# Patient Record
Sex: Male | Born: 1950 | Race: White | Hispanic: No | Marital: Married | Smoking: Former smoker
Health system: Southern US, Community
[De-identification: ages and names within clinical notes are randomized; demographics above are authoritative.]

## PROBLEM LIST (undated history)

## (undated) DIAGNOSIS — M4800 Spinal stenosis, site unspecified: Secondary | ICD-10-CM

## (undated) DIAGNOSIS — K219 Gastro-esophageal reflux disease without esophagitis: Secondary | ICD-10-CM

## (undated) DIAGNOSIS — N3941 Urge incontinence: Secondary | ICD-10-CM

## (undated) DIAGNOSIS — F419 Anxiety disorder, unspecified: Secondary | ICD-10-CM

## (undated) DIAGNOSIS — L568 Other specified acute skin changes due to ultraviolet radiation: Secondary | ICD-10-CM

## (undated) DIAGNOSIS — F32A Depression, unspecified: Secondary | ICD-10-CM

## (undated) DIAGNOSIS — F1010 Alcohol abuse, uncomplicated: Secondary | ICD-10-CM

## (undated) DIAGNOSIS — Z87442 Personal history of urinary calculi: Secondary | ICD-10-CM

## (undated) DIAGNOSIS — G629 Polyneuropathy, unspecified: Secondary | ICD-10-CM

## (undated) DIAGNOSIS — C84A0 Cutaneous T-cell lymphoma, unspecified, unspecified site: Secondary | ICD-10-CM

## (undated) DIAGNOSIS — R9431 Abnormal electrocardiogram [ECG] [EKG]: Secondary | ICD-10-CM

## (undated) DIAGNOSIS — I10 Essential (primary) hypertension: Secondary | ICD-10-CM

## (undated) DIAGNOSIS — N529 Male erectile dysfunction, unspecified: Secondary | ICD-10-CM

## (undated) DIAGNOSIS — F329 Major depressive disorder, single episode, unspecified: Secondary | ICD-10-CM

## (undated) DIAGNOSIS — F1220 Cannabis dependence, uncomplicated: Secondary | ICD-10-CM

## (undated) DIAGNOSIS — G4730 Sleep apnea, unspecified: Secondary | ICD-10-CM

## (undated) HISTORY — PX: COLONOSCOPY: SHX174

## (undated) HISTORY — PX: ESOPHAGOGASTRODUODENOSCOPY: SHX1529

## (undated) HISTORY — DX: Alcohol abuse, uncomplicated: F10.10

## (undated) HISTORY — DX: Essential (primary) hypertension: I10

## (undated) HISTORY — DX: Anxiety disorder, unspecified: F41.9

## (undated) HISTORY — DX: Abnormal electrocardiogram (ECG) (EKG): R94.31

## (undated) HISTORY — PX: ROTATOR CUFF REPAIR: SHX139

## (undated) HISTORY — DX: Cannabis dependence, uncomplicated: F12.20

## (undated) HISTORY — DX: Spinal stenosis, site unspecified: M48.00

## (undated) HISTORY — DX: Other specified acute skin changes due to ultraviolet radiation: L56.8

## (undated) HISTORY — DX: Urge incontinence: N39.41

## (undated) HISTORY — DX: Male erectile dysfunction, unspecified: N52.9

## (undated) HISTORY — DX: Depression, unspecified: F32.A

## (undated) HISTORY — DX: Major depressive disorder, single episode, unspecified: F32.9

---

## 1989-02-28 HISTORY — PX: CARPAL TUNNEL RELEASE: SHX101

## 2014-02-10 HISTORY — PX: ROTATOR CUFF REPAIR: SHX139

## 2014-07-30 HISTORY — PX: CYST REMOVAL TRUNK: SHX6283

## 2016-02-29 HISTORY — PX: ULNAR NERVE TRANSPOSITION: SHX2595

## 2016-04-29 DIAGNOSIS — R202 Paresthesia of skin: Secondary | ICD-10-CM | POA: Diagnosis not present

## 2016-04-29 DIAGNOSIS — G5623 Lesion of ulnar nerve, bilateral upper limbs: Secondary | ICD-10-CM | POA: Diagnosis not present

## 2016-04-29 DIAGNOSIS — R531 Weakness: Secondary | ICD-10-CM | POA: Diagnosis not present

## 2016-05-06 DIAGNOSIS — J309 Allergic rhinitis, unspecified: Secondary | ICD-10-CM | POA: Diagnosis not present

## 2016-05-09 DIAGNOSIS — Z8546 Personal history of malignant neoplasm of prostate: Secondary | ICD-10-CM | POA: Diagnosis not present

## 2016-05-09 DIAGNOSIS — G5622 Lesion of ulnar nerve, left upper limb: Secondary | ICD-10-CM | POA: Diagnosis not present

## 2016-05-09 DIAGNOSIS — M25522 Pain in left elbow: Secondary | ICD-10-CM | POA: Diagnosis not present

## 2016-05-09 DIAGNOSIS — F3289 Other specified depressive episodes: Secondary | ICD-10-CM | POA: Diagnosis not present

## 2016-05-09 DIAGNOSIS — Z886 Allergy status to analgesic agent status: Secondary | ICD-10-CM | POA: Diagnosis not present

## 2016-05-09 DIAGNOSIS — R202 Paresthesia of skin: Secondary | ICD-10-CM | POA: Diagnosis not present

## 2016-05-09 DIAGNOSIS — M79602 Pain in left arm: Secondary | ICD-10-CM | POA: Diagnosis not present

## 2016-05-16 DIAGNOSIS — H2513 Age-related nuclear cataract, bilateral: Secondary | ICD-10-CM | POA: Diagnosis not present

## 2016-05-16 DIAGNOSIS — H40013 Open angle with borderline findings, low risk, bilateral: Secondary | ICD-10-CM | POA: Diagnosis not present

## 2016-05-16 DIAGNOSIS — H5010 Unspecified exotropia: Secondary | ICD-10-CM | POA: Diagnosis not present

## 2016-05-17 DIAGNOSIS — H2513 Age-related nuclear cataract, bilateral: Secondary | ICD-10-CM | POA: Diagnosis not present

## 2016-05-17 DIAGNOSIS — H40013 Open angle with borderline findings, low risk, bilateral: Secondary | ICD-10-CM | POA: Diagnosis not present

## 2016-05-19 DIAGNOSIS — G5622 Lesion of ulnar nerve, left upper limb: Secondary | ICD-10-CM | POA: Diagnosis not present

## 2016-05-19 DIAGNOSIS — M79602 Pain in left arm: Secondary | ICD-10-CM | POA: Diagnosis not present

## 2016-05-19 DIAGNOSIS — R202 Paresthesia of skin: Secondary | ICD-10-CM | POA: Diagnosis not present

## 2016-05-23 DIAGNOSIS — G5621 Lesion of ulnar nerve, right upper limb: Secondary | ICD-10-CM | POA: Diagnosis not present

## 2016-05-23 DIAGNOSIS — R200 Anesthesia of skin: Secondary | ICD-10-CM | POA: Diagnosis not present

## 2016-05-23 DIAGNOSIS — G5622 Lesion of ulnar nerve, left upper limb: Secondary | ICD-10-CM | POA: Diagnosis not present

## 2016-05-24 DIAGNOSIS — G5622 Lesion of ulnar nerve, left upper limb: Secondary | ICD-10-CM | POA: Diagnosis not present

## 2016-05-25 DIAGNOSIS — G5620 Lesion of ulnar nerve, unspecified upper limb: Secondary | ICD-10-CM | POA: Diagnosis not present

## 2016-05-25 DIAGNOSIS — Z01818 Encounter for other preprocedural examination: Secondary | ICD-10-CM | POA: Diagnosis not present

## 2016-05-25 DIAGNOSIS — R9431 Abnormal electrocardiogram [ECG] [EKG]: Secondary | ICD-10-CM | POA: Diagnosis not present

## 2016-05-27 DIAGNOSIS — F339 Major depressive disorder, recurrent, unspecified: Secondary | ICD-10-CM | POA: Diagnosis not present

## 2016-05-27 DIAGNOSIS — Z01810 Encounter for preprocedural cardiovascular examination: Secondary | ICD-10-CM | POA: Diagnosis not present

## 2016-05-27 DIAGNOSIS — G5620 Lesion of ulnar nerve, unspecified upper limb: Secondary | ICD-10-CM | POA: Diagnosis not present

## 2016-05-27 DIAGNOSIS — I10 Essential (primary) hypertension: Secondary | ICD-10-CM | POA: Diagnosis not present

## 2016-05-27 DIAGNOSIS — R9431 Abnormal electrocardiogram [ECG] [EKG]: Secondary | ICD-10-CM | POA: Diagnosis not present

## 2016-05-31 DIAGNOSIS — G5621 Lesion of ulnar nerve, right upper limb: Secondary | ICD-10-CM | POA: Diagnosis not present

## 2016-05-31 DIAGNOSIS — G5622 Lesion of ulnar nerve, left upper limb: Secondary | ICD-10-CM | POA: Diagnosis not present

## 2016-06-01 DIAGNOSIS — M25622 Stiffness of left elbow, not elsewhere classified: Secondary | ICD-10-CM | POA: Diagnosis not present

## 2016-06-01 DIAGNOSIS — G5622 Lesion of ulnar nerve, left upper limb: Secondary | ICD-10-CM | POA: Diagnosis not present

## 2016-06-01 DIAGNOSIS — R531 Weakness: Secondary | ICD-10-CM | POA: Diagnosis not present

## 2016-06-01 DIAGNOSIS — M25522 Pain in left elbow: Secondary | ICD-10-CM | POA: Diagnosis not present

## 2016-06-03 DIAGNOSIS — M25522 Pain in left elbow: Secondary | ICD-10-CM | POA: Diagnosis not present

## 2016-06-03 DIAGNOSIS — R531 Weakness: Secondary | ICD-10-CM | POA: Diagnosis not present

## 2016-06-03 DIAGNOSIS — G5622 Lesion of ulnar nerve, left upper limb: Secondary | ICD-10-CM | POA: Diagnosis not present

## 2016-06-03 DIAGNOSIS — M25622 Stiffness of left elbow, not elsewhere classified: Secondary | ICD-10-CM | POA: Diagnosis not present

## 2016-06-06 DIAGNOSIS — R531 Weakness: Secondary | ICD-10-CM | POA: Diagnosis not present

## 2016-06-06 DIAGNOSIS — M25522 Pain in left elbow: Secondary | ICD-10-CM | POA: Diagnosis not present

## 2016-06-06 DIAGNOSIS — G5622 Lesion of ulnar nerve, left upper limb: Secondary | ICD-10-CM | POA: Diagnosis not present

## 2016-06-06 DIAGNOSIS — M25622 Stiffness of left elbow, not elsewhere classified: Secondary | ICD-10-CM | POA: Diagnosis not present

## 2016-06-07 DIAGNOSIS — Z4789 Encounter for other orthopedic aftercare: Secondary | ICD-10-CM | POA: Diagnosis not present

## 2016-06-07 DIAGNOSIS — G5622 Lesion of ulnar nerve, left upper limb: Secondary | ICD-10-CM | POA: Diagnosis not present

## 2016-06-08 DIAGNOSIS — M25522 Pain in left elbow: Secondary | ICD-10-CM | POA: Diagnosis not present

## 2016-06-08 DIAGNOSIS — G5622 Lesion of ulnar nerve, left upper limb: Secondary | ICD-10-CM | POA: Diagnosis not present

## 2016-06-08 DIAGNOSIS — M25622 Stiffness of left elbow, not elsewhere classified: Secondary | ICD-10-CM | POA: Diagnosis not present

## 2016-06-08 DIAGNOSIS — R531 Weakness: Secondary | ICD-10-CM | POA: Diagnosis not present

## 2016-06-10 DIAGNOSIS — M25522 Pain in left elbow: Secondary | ICD-10-CM | POA: Diagnosis not present

## 2016-06-10 DIAGNOSIS — R531 Weakness: Secondary | ICD-10-CM | POA: Diagnosis not present

## 2016-06-10 DIAGNOSIS — M25622 Stiffness of left elbow, not elsewhere classified: Secondary | ICD-10-CM | POA: Diagnosis not present

## 2016-06-10 DIAGNOSIS — G5622 Lesion of ulnar nerve, left upper limb: Secondary | ICD-10-CM | POA: Diagnosis not present

## 2016-06-14 DIAGNOSIS — M25522 Pain in left elbow: Secondary | ICD-10-CM | POA: Diagnosis not present

## 2016-06-14 DIAGNOSIS — M25622 Stiffness of left elbow, not elsewhere classified: Secondary | ICD-10-CM | POA: Diagnosis not present

## 2016-06-14 DIAGNOSIS — R531 Weakness: Secondary | ICD-10-CM | POA: Diagnosis not present

## 2016-06-14 DIAGNOSIS — G5622 Lesion of ulnar nerve, left upper limb: Secondary | ICD-10-CM | POA: Diagnosis not present

## 2016-06-16 DIAGNOSIS — M25522 Pain in left elbow: Secondary | ICD-10-CM | POA: Diagnosis not present

## 2016-06-16 DIAGNOSIS — M25622 Stiffness of left elbow, not elsewhere classified: Secondary | ICD-10-CM | POA: Diagnosis not present

## 2016-06-16 DIAGNOSIS — G5622 Lesion of ulnar nerve, left upper limb: Secondary | ICD-10-CM | POA: Diagnosis not present

## 2016-06-16 DIAGNOSIS — R531 Weakness: Secondary | ICD-10-CM | POA: Diagnosis not present

## 2016-06-21 DIAGNOSIS — G5622 Lesion of ulnar nerve, left upper limb: Secondary | ICD-10-CM | POA: Diagnosis not present

## 2016-06-21 DIAGNOSIS — M25622 Stiffness of left elbow, not elsewhere classified: Secondary | ICD-10-CM | POA: Diagnosis not present

## 2016-06-21 DIAGNOSIS — M25522 Pain in left elbow: Secondary | ICD-10-CM | POA: Diagnosis not present

## 2016-06-21 DIAGNOSIS — R531 Weakness: Secondary | ICD-10-CM | POA: Diagnosis not present

## 2016-06-24 DIAGNOSIS — R531 Weakness: Secondary | ICD-10-CM | POA: Diagnosis not present

## 2016-06-24 DIAGNOSIS — M25622 Stiffness of left elbow, not elsewhere classified: Secondary | ICD-10-CM | POA: Diagnosis not present

## 2016-06-24 DIAGNOSIS — G5622 Lesion of ulnar nerve, left upper limb: Secondary | ICD-10-CM | POA: Diagnosis not present

## 2016-06-24 DIAGNOSIS — M25522 Pain in left elbow: Secondary | ICD-10-CM | POA: Diagnosis not present

## 2016-06-27 DIAGNOSIS — R9431 Abnormal electrocardiogram [ECG] [EKG]: Secondary | ICD-10-CM | POA: Diagnosis not present

## 2016-06-27 HISTORY — PX: OTHER SURGICAL HISTORY: SHX169

## 2016-06-29 DIAGNOSIS — G5622 Lesion of ulnar nerve, left upper limb: Secondary | ICD-10-CM | POA: Diagnosis not present

## 2016-06-29 DIAGNOSIS — M25522 Pain in left elbow: Secondary | ICD-10-CM | POA: Diagnosis not present

## 2016-06-29 DIAGNOSIS — R531 Weakness: Secondary | ICD-10-CM | POA: Diagnosis not present

## 2016-06-29 DIAGNOSIS — M25622 Stiffness of left elbow, not elsewhere classified: Secondary | ICD-10-CM | POA: Diagnosis not present

## 2016-07-04 DIAGNOSIS — I10 Essential (primary) hypertension: Secondary | ICD-10-CM | POA: Diagnosis not present

## 2016-07-04 DIAGNOSIS — R9431 Abnormal electrocardiogram [ECG] [EKG]: Secondary | ICD-10-CM | POA: Diagnosis not present

## 2016-07-04 DIAGNOSIS — G5620 Lesion of ulnar nerve, unspecified upper limb: Secondary | ICD-10-CM | POA: Diagnosis not present

## 2016-07-04 DIAGNOSIS — Z87891 Personal history of nicotine dependence: Secondary | ICD-10-CM | POA: Diagnosis not present

## 2016-07-04 DIAGNOSIS — F339 Major depressive disorder, recurrent, unspecified: Secondary | ICD-10-CM | POA: Diagnosis not present

## 2016-07-06 DIAGNOSIS — M25622 Stiffness of left elbow, not elsewhere classified: Secondary | ICD-10-CM | POA: Diagnosis not present

## 2016-07-06 DIAGNOSIS — G5622 Lesion of ulnar nerve, left upper limb: Secondary | ICD-10-CM | POA: Diagnosis not present

## 2016-07-06 DIAGNOSIS — R531 Weakness: Secondary | ICD-10-CM | POA: Diagnosis not present

## 2016-07-06 DIAGNOSIS — M25522 Pain in left elbow: Secondary | ICD-10-CM | POA: Diagnosis not present

## 2016-07-18 DIAGNOSIS — F411 Generalized anxiety disorder: Secondary | ICD-10-CM | POA: Diagnosis not present

## 2016-07-18 DIAGNOSIS — F332 Major depressive disorder, recurrent severe without psychotic features: Secondary | ICD-10-CM | POA: Diagnosis not present

## 2016-07-18 DIAGNOSIS — Z4789 Encounter for other orthopedic aftercare: Secondary | ICD-10-CM | POA: Diagnosis not present

## 2016-07-18 DIAGNOSIS — G5622 Lesion of ulnar nerve, left upper limb: Secondary | ICD-10-CM | POA: Diagnosis not present

## 2016-10-20 DIAGNOSIS — M25522 Pain in left elbow: Secondary | ICD-10-CM | POA: Diagnosis not present

## 2016-10-20 DIAGNOSIS — M7022 Olecranon bursitis, left elbow: Secondary | ICD-10-CM | POA: Diagnosis not present

## 2016-10-20 DIAGNOSIS — G5622 Lesion of ulnar nerve, left upper limb: Secondary | ICD-10-CM | POA: Diagnosis not present

## 2016-11-11 DIAGNOSIS — Z125 Encounter for screening for malignant neoplasm of prostate: Secondary | ICD-10-CM | POA: Diagnosis not present

## 2016-11-11 DIAGNOSIS — F339 Major depressive disorder, recurrent, unspecified: Secondary | ICD-10-CM | POA: Diagnosis not present

## 2016-11-11 DIAGNOSIS — I10 Essential (primary) hypertension: Secondary | ICD-10-CM | POA: Diagnosis not present

## 2016-11-18 DIAGNOSIS — Z6833 Body mass index (BMI) 33.0-33.9, adult: Secondary | ICD-10-CM | POA: Diagnosis not present

## 2016-11-18 DIAGNOSIS — Z23 Encounter for immunization: Secondary | ICD-10-CM | POA: Diagnosis not present

## 2016-11-18 DIAGNOSIS — Z136 Encounter for screening for cardiovascular disorders: Secondary | ICD-10-CM | POA: Diagnosis not present

## 2016-11-18 DIAGNOSIS — I10 Essential (primary) hypertension: Secondary | ICD-10-CM | POA: Diagnosis not present

## 2016-11-18 DIAGNOSIS — Z1389 Encounter for screening for other disorder: Secondary | ICD-10-CM | POA: Diagnosis not present

## 2016-11-18 DIAGNOSIS — Z87891 Personal history of nicotine dependence: Secondary | ICD-10-CM | POA: Diagnosis not present

## 2016-11-18 DIAGNOSIS — Z0000 Encounter for general adult medical examination without abnormal findings: Secondary | ICD-10-CM | POA: Diagnosis not present

## 2016-11-18 DIAGNOSIS — R972 Elevated prostate specific antigen [PSA]: Secondary | ICD-10-CM | POA: Diagnosis not present

## 2016-11-18 DIAGNOSIS — Z1159 Encounter for screening for other viral diseases: Secondary | ICD-10-CM | POA: Diagnosis not present

## 2016-11-21 DIAGNOSIS — F411 Generalized anxiety disorder: Secondary | ICD-10-CM | POA: Diagnosis not present

## 2016-11-21 DIAGNOSIS — F332 Major depressive disorder, recurrent severe without psychotic features: Secondary | ICD-10-CM | POA: Diagnosis not present

## 2016-11-21 DIAGNOSIS — M25522 Pain in left elbow: Secondary | ICD-10-CM | POA: Diagnosis not present

## 2016-11-21 DIAGNOSIS — G5622 Lesion of ulnar nerve, left upper limb: Secondary | ICD-10-CM | POA: Diagnosis not present

## 2016-11-21 DIAGNOSIS — M7022 Olecranon bursitis, left elbow: Secondary | ICD-10-CM | POA: Diagnosis not present

## 2016-12-21 DIAGNOSIS — R350 Frequency of micturition: Secondary | ICD-10-CM | POA: Diagnosis not present

## 2016-12-21 DIAGNOSIS — N200 Calculus of kidney: Secondary | ICD-10-CM | POA: Diagnosis not present

## 2016-12-21 DIAGNOSIS — G5623 Lesion of ulnar nerve, bilateral upper limbs: Secondary | ICD-10-CM | POA: Diagnosis not present

## 2016-12-21 DIAGNOSIS — M542 Cervicalgia: Secondary | ICD-10-CM | POA: Diagnosis not present

## 2016-12-21 DIAGNOSIS — R319 Hematuria, unspecified: Secondary | ICD-10-CM | POA: Diagnosis not present

## 2016-12-21 DIAGNOSIS — M50122 Cervical disc disorder at C5-C6 level with radiculopathy: Secondary | ICD-10-CM | POA: Diagnosis not present

## 2016-12-21 DIAGNOSIS — M50123 Cervical disc disorder at C6-C7 level with radiculopathy: Secondary | ICD-10-CM | POA: Diagnosis not present

## 2016-12-21 DIAGNOSIS — M488X2 Other specified spondylopathies, cervical region: Secondary | ICD-10-CM | POA: Diagnosis not present

## 2017-02-01 DIAGNOSIS — M5030 Other cervical disc degeneration, unspecified cervical region: Secondary | ICD-10-CM | POA: Diagnosis not present

## 2017-02-01 DIAGNOSIS — M542 Cervicalgia: Secondary | ICD-10-CM | POA: Diagnosis not present

## 2017-02-01 DIAGNOSIS — M5412 Radiculopathy, cervical region: Secondary | ICD-10-CM | POA: Diagnosis not present

## 2017-02-03 DIAGNOSIS — M542 Cervicalgia: Secondary | ICD-10-CM | POA: Diagnosis not present

## 2017-02-03 DIAGNOSIS — M5412 Radiculopathy, cervical region: Secondary | ICD-10-CM | POA: Diagnosis not present

## 2017-02-03 DIAGNOSIS — M5030 Other cervical disc degeneration, unspecified cervical region: Secondary | ICD-10-CM | POA: Diagnosis not present

## 2017-02-06 DIAGNOSIS — M5412 Radiculopathy, cervical region: Secondary | ICD-10-CM | POA: Diagnosis not present

## 2017-02-06 DIAGNOSIS — M5030 Other cervical disc degeneration, unspecified cervical region: Secondary | ICD-10-CM | POA: Diagnosis not present

## 2017-02-08 DIAGNOSIS — M542 Cervicalgia: Secondary | ICD-10-CM | POA: Diagnosis not present

## 2017-02-08 DIAGNOSIS — M5030 Other cervical disc degeneration, unspecified cervical region: Secondary | ICD-10-CM | POA: Diagnosis not present

## 2017-02-08 DIAGNOSIS — M5412 Radiculopathy, cervical region: Secondary | ICD-10-CM | POA: Diagnosis not present

## 2017-02-10 DIAGNOSIS — M542 Cervicalgia: Secondary | ICD-10-CM | POA: Diagnosis not present

## 2017-02-10 DIAGNOSIS — M5030 Other cervical disc degeneration, unspecified cervical region: Secondary | ICD-10-CM | POA: Diagnosis not present

## 2017-02-10 DIAGNOSIS — M5412 Radiculopathy, cervical region: Secondary | ICD-10-CM | POA: Diagnosis not present

## 2017-02-13 DIAGNOSIS — M542 Cervicalgia: Secondary | ICD-10-CM | POA: Diagnosis not present

## 2017-02-13 DIAGNOSIS — M5030 Other cervical disc degeneration, unspecified cervical region: Secondary | ICD-10-CM | POA: Diagnosis not present

## 2017-02-13 DIAGNOSIS — M5412 Radiculopathy, cervical region: Secondary | ICD-10-CM | POA: Diagnosis not present

## 2017-02-15 DIAGNOSIS — M5412 Radiculopathy, cervical region: Secondary | ICD-10-CM | POA: Diagnosis not present

## 2017-02-15 DIAGNOSIS — M5030 Other cervical disc degeneration, unspecified cervical region: Secondary | ICD-10-CM | POA: Diagnosis not present

## 2017-02-15 DIAGNOSIS — M542 Cervicalgia: Secondary | ICD-10-CM | POA: Diagnosis not present

## 2017-02-17 DIAGNOSIS — M5412 Radiculopathy, cervical region: Secondary | ICD-10-CM | POA: Diagnosis not present

## 2017-02-17 DIAGNOSIS — M5030 Other cervical disc degeneration, unspecified cervical region: Secondary | ICD-10-CM | POA: Diagnosis not present

## 2017-02-17 DIAGNOSIS — M542 Cervicalgia: Secondary | ICD-10-CM | POA: Diagnosis not present

## 2017-02-24 DIAGNOSIS — M5412 Radiculopathy, cervical region: Secondary | ICD-10-CM | POA: Diagnosis not present

## 2017-02-24 DIAGNOSIS — M542 Cervicalgia: Secondary | ICD-10-CM | POA: Diagnosis not present

## 2017-02-24 DIAGNOSIS — M5030 Other cervical disc degeneration, unspecified cervical region: Secondary | ICD-10-CM | POA: Diagnosis not present

## 2017-02-27 DIAGNOSIS — M5412 Radiculopathy, cervical region: Secondary | ICD-10-CM | POA: Diagnosis not present

## 2017-02-27 DIAGNOSIS — M5030 Other cervical disc degeneration, unspecified cervical region: Secondary | ICD-10-CM | POA: Diagnosis not present

## 2017-02-27 DIAGNOSIS — M542 Cervicalgia: Secondary | ICD-10-CM | POA: Diagnosis not present

## 2017-03-06 DIAGNOSIS — M25512 Pain in left shoulder: Secondary | ICD-10-CM | POA: Diagnosis not present

## 2017-03-07 DIAGNOSIS — S46012A Strain of muscle(s) and tendon(s) of the rotator cuff of left shoulder, initial encounter: Secondary | ICD-10-CM | POA: Diagnosis not present

## 2017-03-08 DIAGNOSIS — M25512 Pain in left shoulder: Secondary | ICD-10-CM | POA: Diagnosis not present

## 2017-03-16 DIAGNOSIS — M75121 Complete rotator cuff tear or rupture of right shoulder, not specified as traumatic: Secondary | ICD-10-CM | POA: Diagnosis not present

## 2017-03-17 DIAGNOSIS — M25512 Pain in left shoulder: Secondary | ICD-10-CM | POA: Diagnosis not present

## 2017-03-17 DIAGNOSIS — I10 Essential (primary) hypertension: Secondary | ICD-10-CM | POA: Diagnosis not present

## 2017-03-17 DIAGNOSIS — F339 Major depressive disorder, recurrent, unspecified: Secondary | ICD-10-CM | POA: Diagnosis not present

## 2017-03-17 DIAGNOSIS — Z01812 Encounter for preprocedural laboratory examination: Secondary | ICD-10-CM | POA: Diagnosis not present

## 2017-03-17 DIAGNOSIS — G5620 Lesion of ulnar nerve, unspecified upper limb: Secondary | ICD-10-CM | POA: Diagnosis not present

## 2017-03-17 DIAGNOSIS — M75121 Complete rotator cuff tear or rupture of right shoulder, not specified as traumatic: Secondary | ICD-10-CM | POA: Diagnosis not present

## 2017-03-17 DIAGNOSIS — R9431 Abnormal electrocardiogram [ECG] [EKG]: Secondary | ICD-10-CM | POA: Diagnosis not present

## 2017-03-17 DIAGNOSIS — Z01810 Encounter for preprocedural cardiovascular examination: Secondary | ICD-10-CM | POA: Diagnosis not present

## 2017-03-21 DIAGNOSIS — F332 Major depressive disorder, recurrent severe without psychotic features: Secondary | ICD-10-CM | POA: Diagnosis not present

## 2017-03-21 DIAGNOSIS — F320 Major depressive disorder, single episode, mild: Secondary | ICD-10-CM | POA: Diagnosis not present

## 2017-03-21 DIAGNOSIS — F411 Generalized anxiety disorder: Secondary | ICD-10-CM | POA: Diagnosis not present

## 2017-03-21 DIAGNOSIS — F5104 Psychophysiologic insomnia: Secondary | ICD-10-CM | POA: Diagnosis not present

## 2017-03-27 DIAGNOSIS — M75121 Complete rotator cuff tear or rupture of right shoulder, not specified as traumatic: Secondary | ICD-10-CM | POA: Diagnosis not present

## 2017-03-28 DIAGNOSIS — M25512 Pain in left shoulder: Secondary | ICD-10-CM | POA: Diagnosis not present

## 2017-03-28 DIAGNOSIS — M19012 Primary osteoarthritis, left shoulder: Secondary | ICD-10-CM | POA: Diagnosis not present

## 2017-03-28 DIAGNOSIS — M75122 Complete rotator cuff tear or rupture of left shoulder, not specified as traumatic: Secondary | ICD-10-CM | POA: Diagnosis not present

## 2017-03-28 DIAGNOSIS — M7542 Impingement syndrome of left shoulder: Secondary | ICD-10-CM | POA: Diagnosis not present

## 2017-03-28 DIAGNOSIS — M65812 Other synovitis and tenosynovitis, left shoulder: Secondary | ICD-10-CM | POA: Diagnosis not present

## 2017-03-28 DIAGNOSIS — M75121 Complete rotator cuff tear or rupture of right shoulder, not specified as traumatic: Secondary | ICD-10-CM | POA: Diagnosis not present

## 2017-03-28 DIAGNOSIS — G8918 Other acute postprocedural pain: Secondary | ICD-10-CM | POA: Diagnosis not present

## 2017-03-28 DIAGNOSIS — M7502 Adhesive capsulitis of left shoulder: Secondary | ICD-10-CM | POA: Diagnosis not present

## 2017-03-28 DIAGNOSIS — M67814 Other specified disorders of tendon, left shoulder: Secondary | ICD-10-CM | POA: Diagnosis not present

## 2017-03-28 DIAGNOSIS — M24112 Other articular cartilage disorders, left shoulder: Secondary | ICD-10-CM | POA: Diagnosis not present

## 2017-03-28 DIAGNOSIS — M94212 Chondromalacia, left shoulder: Secondary | ICD-10-CM | POA: Diagnosis not present

## 2017-03-28 DIAGNOSIS — M24212 Disorder of ligament, left shoulder: Secondary | ICD-10-CM | POA: Diagnosis not present

## 2017-03-28 DIAGNOSIS — M7552 Bursitis of left shoulder: Secondary | ICD-10-CM | POA: Diagnosis not present

## 2017-03-29 DIAGNOSIS — Z4789 Encounter for other orthopedic aftercare: Secondary | ICD-10-CM | POA: Diagnosis not present

## 2017-03-29 DIAGNOSIS — F329 Major depressive disorder, single episode, unspecified: Secondary | ICD-10-CM | POA: Diagnosis not present

## 2017-03-29 DIAGNOSIS — Z9181 History of falling: Secondary | ICD-10-CM | POA: Diagnosis not present

## 2017-03-29 DIAGNOSIS — I10 Essential (primary) hypertension: Secondary | ICD-10-CM | POA: Diagnosis not present

## 2017-03-29 DIAGNOSIS — M13812 Other specified arthritis, left shoulder: Secondary | ICD-10-CM | POA: Diagnosis not present

## 2017-03-29 DIAGNOSIS — M109 Gout, unspecified: Secondary | ICD-10-CM | POA: Diagnosis not present

## 2017-03-30 DIAGNOSIS — Z9181 History of falling: Secondary | ICD-10-CM | POA: Diagnosis not present

## 2017-03-30 DIAGNOSIS — M13812 Other specified arthritis, left shoulder: Secondary | ICD-10-CM | POA: Diagnosis not present

## 2017-03-30 DIAGNOSIS — F329 Major depressive disorder, single episode, unspecified: Secondary | ICD-10-CM | POA: Diagnosis not present

## 2017-03-30 DIAGNOSIS — Z4789 Encounter for other orthopedic aftercare: Secondary | ICD-10-CM | POA: Diagnosis not present

## 2017-03-30 DIAGNOSIS — I10 Essential (primary) hypertension: Secondary | ICD-10-CM | POA: Diagnosis not present

## 2017-03-30 DIAGNOSIS — M109 Gout, unspecified: Secondary | ICD-10-CM | POA: Diagnosis not present

## 2017-03-31 DIAGNOSIS — M13812 Other specified arthritis, left shoulder: Secondary | ICD-10-CM | POA: Diagnosis not present

## 2017-03-31 DIAGNOSIS — M109 Gout, unspecified: Secondary | ICD-10-CM | POA: Diagnosis not present

## 2017-03-31 DIAGNOSIS — I10 Essential (primary) hypertension: Secondary | ICD-10-CM | POA: Diagnosis not present

## 2017-03-31 DIAGNOSIS — F329 Major depressive disorder, single episode, unspecified: Secondary | ICD-10-CM | POA: Diagnosis not present

## 2017-03-31 DIAGNOSIS — Z4789 Encounter for other orthopedic aftercare: Secondary | ICD-10-CM | POA: Diagnosis not present

## 2017-03-31 DIAGNOSIS — Z9181 History of falling: Secondary | ICD-10-CM | POA: Diagnosis not present

## 2017-04-01 DIAGNOSIS — M13812 Other specified arthritis, left shoulder: Secondary | ICD-10-CM | POA: Diagnosis not present

## 2017-04-01 DIAGNOSIS — F329 Major depressive disorder, single episode, unspecified: Secondary | ICD-10-CM | POA: Diagnosis not present

## 2017-04-01 DIAGNOSIS — Z4789 Encounter for other orthopedic aftercare: Secondary | ICD-10-CM | POA: Diagnosis not present

## 2017-04-01 DIAGNOSIS — M109 Gout, unspecified: Secondary | ICD-10-CM | POA: Diagnosis not present

## 2017-04-01 DIAGNOSIS — I10 Essential (primary) hypertension: Secondary | ICD-10-CM | POA: Diagnosis not present

## 2017-04-01 DIAGNOSIS — Z9181 History of falling: Secondary | ICD-10-CM | POA: Diagnosis not present

## 2017-04-03 DIAGNOSIS — I10 Essential (primary) hypertension: Secondary | ICD-10-CM | POA: Diagnosis not present

## 2017-04-03 DIAGNOSIS — M109 Gout, unspecified: Secondary | ICD-10-CM | POA: Diagnosis not present

## 2017-04-03 DIAGNOSIS — Z9181 History of falling: Secondary | ICD-10-CM | POA: Diagnosis not present

## 2017-04-03 DIAGNOSIS — Z4789 Encounter for other orthopedic aftercare: Secondary | ICD-10-CM | POA: Diagnosis not present

## 2017-04-03 DIAGNOSIS — M13812 Other specified arthritis, left shoulder: Secondary | ICD-10-CM | POA: Diagnosis not present

## 2017-04-03 DIAGNOSIS — F329 Major depressive disorder, single episode, unspecified: Secondary | ICD-10-CM | POA: Diagnosis not present

## 2017-04-05 DIAGNOSIS — Z4789 Encounter for other orthopedic aftercare: Secondary | ICD-10-CM | POA: Diagnosis not present

## 2017-04-05 DIAGNOSIS — M13812 Other specified arthritis, left shoulder: Secondary | ICD-10-CM | POA: Diagnosis not present

## 2017-04-05 DIAGNOSIS — Z9181 History of falling: Secondary | ICD-10-CM | POA: Diagnosis not present

## 2017-04-05 DIAGNOSIS — I10 Essential (primary) hypertension: Secondary | ICD-10-CM | POA: Diagnosis not present

## 2017-04-05 DIAGNOSIS — F329 Major depressive disorder, single episode, unspecified: Secondary | ICD-10-CM | POA: Diagnosis not present

## 2017-04-05 DIAGNOSIS — Z9889 Other specified postprocedural states: Secondary | ICD-10-CM | POA: Diagnosis not present

## 2017-04-05 DIAGNOSIS — M109 Gout, unspecified: Secondary | ICD-10-CM | POA: Diagnosis not present

## 2017-04-07 DIAGNOSIS — F329 Major depressive disorder, single episode, unspecified: Secondary | ICD-10-CM | POA: Diagnosis not present

## 2017-04-07 DIAGNOSIS — I10 Essential (primary) hypertension: Secondary | ICD-10-CM | POA: Diagnosis not present

## 2017-04-07 DIAGNOSIS — Z4789 Encounter for other orthopedic aftercare: Secondary | ICD-10-CM | POA: Diagnosis not present

## 2017-04-07 DIAGNOSIS — M13812 Other specified arthritis, left shoulder: Secondary | ICD-10-CM | POA: Diagnosis not present

## 2017-04-07 DIAGNOSIS — Z9181 History of falling: Secondary | ICD-10-CM | POA: Diagnosis not present

## 2017-04-07 DIAGNOSIS — M109 Gout, unspecified: Secondary | ICD-10-CM | POA: Diagnosis not present

## 2017-04-10 DIAGNOSIS — M25612 Stiffness of left shoulder, not elsewhere classified: Secondary | ICD-10-CM | POA: Diagnosis not present

## 2017-04-10 DIAGNOSIS — R531 Weakness: Secondary | ICD-10-CM | POA: Diagnosis not present

## 2017-04-10 DIAGNOSIS — M25512 Pain in left shoulder: Secondary | ICD-10-CM | POA: Diagnosis not present

## 2017-04-10 DIAGNOSIS — M75122 Complete rotator cuff tear or rupture of left shoulder, not specified as traumatic: Secondary | ICD-10-CM | POA: Diagnosis not present

## 2017-04-13 DIAGNOSIS — M13812 Other specified arthritis, left shoulder: Secondary | ICD-10-CM | POA: Diagnosis not present

## 2017-04-13 DIAGNOSIS — I10 Essential (primary) hypertension: Secondary | ICD-10-CM | POA: Diagnosis not present

## 2017-04-13 DIAGNOSIS — Z4789 Encounter for other orthopedic aftercare: Secondary | ICD-10-CM | POA: Diagnosis not present

## 2017-04-13 DIAGNOSIS — M109 Gout, unspecified: Secondary | ICD-10-CM | POA: Diagnosis not present

## 2017-04-14 DIAGNOSIS — M25612 Stiffness of left shoulder, not elsewhere classified: Secondary | ICD-10-CM | POA: Diagnosis not present

## 2017-04-14 DIAGNOSIS — M75122 Complete rotator cuff tear or rupture of left shoulder, not specified as traumatic: Secondary | ICD-10-CM | POA: Diagnosis not present

## 2017-04-14 DIAGNOSIS — R531 Weakness: Secondary | ICD-10-CM | POA: Diagnosis not present

## 2017-04-20 DIAGNOSIS — M25612 Stiffness of left shoulder, not elsewhere classified: Secondary | ICD-10-CM | POA: Diagnosis not present

## 2017-04-20 DIAGNOSIS — M75122 Complete rotator cuff tear or rupture of left shoulder, not specified as traumatic: Secondary | ICD-10-CM | POA: Diagnosis not present

## 2017-04-20 DIAGNOSIS — R531 Weakness: Secondary | ICD-10-CM | POA: Diagnosis not present

## 2017-04-27 DIAGNOSIS — M25612 Stiffness of left shoulder, not elsewhere classified: Secondary | ICD-10-CM | POA: Diagnosis not present

## 2017-04-27 DIAGNOSIS — R531 Weakness: Secondary | ICD-10-CM | POA: Diagnosis not present

## 2017-04-27 DIAGNOSIS — M75122 Complete rotator cuff tear or rupture of left shoulder, not specified as traumatic: Secondary | ICD-10-CM | POA: Diagnosis not present

## 2017-05-04 DIAGNOSIS — R531 Weakness: Secondary | ICD-10-CM | POA: Diagnosis not present

## 2017-05-04 DIAGNOSIS — M75122 Complete rotator cuff tear or rupture of left shoulder, not specified as traumatic: Secondary | ICD-10-CM | POA: Diagnosis not present

## 2017-05-04 DIAGNOSIS — M25612 Stiffness of left shoulder, not elsewhere classified: Secondary | ICD-10-CM | POA: Diagnosis not present

## 2017-05-04 DIAGNOSIS — M25512 Pain in left shoulder: Secondary | ICD-10-CM | POA: Diagnosis not present

## 2017-05-05 DIAGNOSIS — H6123 Impacted cerumen, bilateral: Secondary | ICD-10-CM | POA: Diagnosis not present

## 2017-05-05 DIAGNOSIS — J309 Allergic rhinitis, unspecified: Secondary | ICD-10-CM | POA: Diagnosis not present

## 2017-05-09 DIAGNOSIS — R531 Weakness: Secondary | ICD-10-CM | POA: Diagnosis not present

## 2017-05-09 DIAGNOSIS — M75122 Complete rotator cuff tear or rupture of left shoulder, not specified as traumatic: Secondary | ICD-10-CM | POA: Diagnosis not present

## 2017-05-09 DIAGNOSIS — M25612 Stiffness of left shoulder, not elsewhere classified: Secondary | ICD-10-CM | POA: Diagnosis not present

## 2017-05-10 DIAGNOSIS — M25522 Pain in left elbow: Secondary | ICD-10-CM | POA: Diagnosis not present

## 2017-05-10 DIAGNOSIS — M19022 Primary osteoarthritis, left elbow: Secondary | ICD-10-CM | POA: Diagnosis not present

## 2017-05-12 DIAGNOSIS — M75122 Complete rotator cuff tear or rupture of left shoulder, not specified as traumatic: Secondary | ICD-10-CM | POA: Diagnosis not present

## 2017-05-12 DIAGNOSIS — R531 Weakness: Secondary | ICD-10-CM | POA: Diagnosis not present

## 2017-05-12 DIAGNOSIS — M25612 Stiffness of left shoulder, not elsewhere classified: Secondary | ICD-10-CM | POA: Diagnosis not present

## 2017-05-16 DIAGNOSIS — M75122 Complete rotator cuff tear or rupture of left shoulder, not specified as traumatic: Secondary | ICD-10-CM | POA: Diagnosis not present

## 2017-05-16 DIAGNOSIS — R531 Weakness: Secondary | ICD-10-CM | POA: Diagnosis not present

## 2017-05-16 DIAGNOSIS — M25612 Stiffness of left shoulder, not elsewhere classified: Secondary | ICD-10-CM | POA: Diagnosis not present

## 2017-05-18 DIAGNOSIS — M75122 Complete rotator cuff tear or rupture of left shoulder, not specified as traumatic: Secondary | ICD-10-CM | POA: Diagnosis not present

## 2017-05-18 DIAGNOSIS — R531 Weakness: Secondary | ICD-10-CM | POA: Diagnosis not present

## 2017-05-18 DIAGNOSIS — M25612 Stiffness of left shoulder, not elsewhere classified: Secondary | ICD-10-CM | POA: Diagnosis not present

## 2017-05-23 DIAGNOSIS — R531 Weakness: Secondary | ICD-10-CM | POA: Diagnosis not present

## 2017-05-23 DIAGNOSIS — M25612 Stiffness of left shoulder, not elsewhere classified: Secondary | ICD-10-CM | POA: Diagnosis not present

## 2017-05-23 DIAGNOSIS — M75122 Complete rotator cuff tear or rupture of left shoulder, not specified as traumatic: Secondary | ICD-10-CM | POA: Diagnosis not present

## 2017-05-26 DIAGNOSIS — M75122 Complete rotator cuff tear or rupture of left shoulder, not specified as traumatic: Secondary | ICD-10-CM | POA: Diagnosis not present

## 2017-05-26 DIAGNOSIS — R531 Weakness: Secondary | ICD-10-CM | POA: Diagnosis not present

## 2017-05-26 DIAGNOSIS — M25612 Stiffness of left shoulder, not elsewhere classified: Secondary | ICD-10-CM | POA: Diagnosis not present

## 2017-05-26 DIAGNOSIS — M25512 Pain in left shoulder: Secondary | ICD-10-CM | POA: Diagnosis not present

## 2017-05-29 DIAGNOSIS — M75122 Complete rotator cuff tear or rupture of left shoulder, not specified as traumatic: Secondary | ICD-10-CM | POA: Diagnosis not present

## 2017-05-29 DIAGNOSIS — R531 Weakness: Secondary | ICD-10-CM | POA: Diagnosis not present

## 2017-05-29 DIAGNOSIS — M25512 Pain in left shoulder: Secondary | ICD-10-CM | POA: Diagnosis not present

## 2017-05-29 DIAGNOSIS — M25612 Stiffness of left shoulder, not elsewhere classified: Secondary | ICD-10-CM | POA: Diagnosis not present

## 2017-06-01 DIAGNOSIS — M25612 Stiffness of left shoulder, not elsewhere classified: Secondary | ICD-10-CM | POA: Diagnosis not present

## 2017-06-01 DIAGNOSIS — M75122 Complete rotator cuff tear or rupture of left shoulder, not specified as traumatic: Secondary | ICD-10-CM | POA: Diagnosis not present

## 2017-06-01 DIAGNOSIS — R531 Weakness: Secondary | ICD-10-CM | POA: Diagnosis not present

## 2017-06-05 DIAGNOSIS — R531 Weakness: Secondary | ICD-10-CM | POA: Diagnosis not present

## 2017-06-05 DIAGNOSIS — M75122 Complete rotator cuff tear or rupture of left shoulder, not specified as traumatic: Secondary | ICD-10-CM | POA: Diagnosis not present

## 2017-06-05 DIAGNOSIS — M25612 Stiffness of left shoulder, not elsewhere classified: Secondary | ICD-10-CM | POA: Diagnosis not present

## 2017-06-05 DIAGNOSIS — M25512 Pain in left shoulder: Secondary | ICD-10-CM | POA: Diagnosis not present

## 2017-06-08 DIAGNOSIS — M75122 Complete rotator cuff tear or rupture of left shoulder, not specified as traumatic: Secondary | ICD-10-CM | POA: Diagnosis not present

## 2017-06-08 DIAGNOSIS — M25612 Stiffness of left shoulder, not elsewhere classified: Secondary | ICD-10-CM | POA: Diagnosis not present

## 2017-06-08 DIAGNOSIS — R531 Weakness: Secondary | ICD-10-CM | POA: Diagnosis not present

## 2017-07-03 DIAGNOSIS — M25522 Pain in left elbow: Secondary | ICD-10-CM | POA: Diagnosis not present

## 2017-07-03 DIAGNOSIS — M19022 Primary osteoarthritis, left elbow: Secondary | ICD-10-CM | POA: Diagnosis not present

## 2017-07-03 DIAGNOSIS — R531 Weakness: Secondary | ICD-10-CM | POA: Diagnosis not present

## 2017-07-03 DIAGNOSIS — M25612 Stiffness of left shoulder, not elsewhere classified: Secondary | ICD-10-CM | POA: Diagnosis not present

## 2017-07-03 DIAGNOSIS — M75122 Complete rotator cuff tear or rupture of left shoulder, not specified as traumatic: Secondary | ICD-10-CM | POA: Diagnosis not present

## 2017-07-03 DIAGNOSIS — Z9889 Other specified postprocedural states: Secondary | ICD-10-CM | POA: Diagnosis not present

## 2017-07-03 DIAGNOSIS — M25512 Pain in left shoulder: Secondary | ICD-10-CM | POA: Diagnosis not present

## 2017-07-07 DIAGNOSIS — H2513 Age-related nuclear cataract, bilateral: Secondary | ICD-10-CM | POA: Diagnosis not present

## 2017-07-07 DIAGNOSIS — H35371 Puckering of macula, right eye: Secondary | ICD-10-CM | POA: Diagnosis not present

## 2017-08-01 DIAGNOSIS — M48061 Spinal stenosis, lumbar region without neurogenic claudication: Secondary | ICD-10-CM | POA: Insufficient documentation

## 2017-08-01 DIAGNOSIS — M4310 Spondylolisthesis, site unspecified: Secondary | ICD-10-CM | POA: Diagnosis not present

## 2017-08-01 DIAGNOSIS — M5136 Other intervertebral disc degeneration, lumbar region: Secondary | ICD-10-CM | POA: Diagnosis not present

## 2017-08-01 DIAGNOSIS — M545 Low back pain: Secondary | ICD-10-CM | POA: Diagnosis not present

## 2017-08-07 DIAGNOSIS — M545 Low back pain: Secondary | ICD-10-CM | POA: Diagnosis not present

## 2017-08-09 DIAGNOSIS — M545 Low back pain: Secondary | ICD-10-CM | POA: Diagnosis not present

## 2017-08-10 DIAGNOSIS — M545 Low back pain: Secondary | ICD-10-CM | POA: Diagnosis not present

## 2017-08-15 DIAGNOSIS — M48061 Spinal stenosis, lumbar region without neurogenic claudication: Secondary | ICD-10-CM | POA: Diagnosis not present

## 2017-08-15 DIAGNOSIS — M4310 Spondylolisthesis, site unspecified: Secondary | ICD-10-CM | POA: Diagnosis not present

## 2017-08-15 DIAGNOSIS — M5136 Other intervertebral disc degeneration, lumbar region: Secondary | ICD-10-CM | POA: Diagnosis not present

## 2017-08-16 DIAGNOSIS — B078 Other viral warts: Secondary | ICD-10-CM | POA: Diagnosis not present

## 2017-09-07 DIAGNOSIS — M48061 Spinal stenosis, lumbar region without neurogenic claudication: Secondary | ICD-10-CM | POA: Diagnosis not present

## 2017-09-07 DIAGNOSIS — M5136 Other intervertebral disc degeneration, lumbar region: Secondary | ICD-10-CM | POA: Diagnosis not present

## 2017-09-19 DIAGNOSIS — I10 Essential (primary) hypertension: Secondary | ICD-10-CM | POA: Diagnosis not present

## 2017-09-19 DIAGNOSIS — F339 Major depressive disorder, recurrent, unspecified: Secondary | ICD-10-CM | POA: Diagnosis not present

## 2017-09-19 DIAGNOSIS — M4800 Spinal stenosis, site unspecified: Secondary | ICD-10-CM | POA: Diagnosis not present

## 2017-09-19 DIAGNOSIS — R9431 Abnormal electrocardiogram [ECG] [EKG]: Secondary | ICD-10-CM

## 2017-09-19 HISTORY — DX: Abnormal electrocardiogram (ECG) (EKG): R94.31

## 2017-09-20 DIAGNOSIS — K219 Gastro-esophageal reflux disease without esophagitis: Secondary | ICD-10-CM | POA: Diagnosis not present

## 2017-10-02 ENCOUNTER — Encounter: Payer: Self-pay | Admitting: Family Medicine

## 2017-10-02 DIAGNOSIS — M48061 Spinal stenosis, lumbar region without neurogenic claudication: Secondary | ICD-10-CM | POA: Diagnosis not present

## 2017-10-05 ENCOUNTER — Encounter: Payer: Self-pay | Admitting: *Deleted

## 2017-10-11 ENCOUNTER — Encounter: Payer: Self-pay | Admitting: Family Medicine

## 2017-10-11 ENCOUNTER — Ambulatory Visit (INDEPENDENT_AMBULATORY_CARE_PROVIDER_SITE_OTHER): Payer: Medicare Other | Admitting: Family Medicine

## 2017-10-11 VITALS — BP 128/75 | HR 93 | Temp 97.0°F | Resp 16 | Ht 67.0 in | Wt 222.0 lb

## 2017-10-11 DIAGNOSIS — I10 Essential (primary) hypertension: Secondary | ICD-10-CM

## 2017-10-11 DIAGNOSIS — R7309 Other abnormal glucose: Secondary | ICD-10-CM

## 2017-10-11 DIAGNOSIS — Z7689 Persons encountering health services in other specified circumstances: Secondary | ICD-10-CM | POA: Diagnosis not present

## 2017-10-11 DIAGNOSIS — M4800 Spinal stenosis, site unspecified: Secondary | ICD-10-CM | POA: Diagnosis not present

## 2017-10-11 LAB — COMPREHENSIVE METABOLIC PANEL
ALT: 20 U/L (ref 0–53)
AST: 25 U/L (ref 0–37)
Albumin: 4.1 g/dL (ref 3.5–5.2)
Alkaline Phosphatase: 56 U/L (ref 39–117)
BILIRUBIN TOTAL: 0.4 mg/dL (ref 0.2–1.2)
BUN: 17 mg/dL (ref 6–23)
CO2: 30 mEq/L (ref 19–32)
CREATININE: 1.02 mg/dL (ref 0.40–1.50)
Calcium: 9.5 mg/dL (ref 8.4–10.5)
Chloride: 105 mEq/L (ref 96–112)
GFR: 77.56 mL/min (ref >60.00)
GLUCOSE: 97 mg/dL (ref 70–99)
POTASSIUM: 3.9 meq/L (ref 3.5–5.1)
SODIUM: 141 meq/L (ref 135–145)
Total Protein: 6.6 g/dL (ref 6.0–8.3)

## 2017-10-11 LAB — CBC
HCT: 42.6 % (ref 39.0–52.0)
Hemoglobin: 14.5 g/dL (ref 13.0–17.0)
MCHC: 33.9 g/dL (ref 30.0–36.0)
MCV: 95.7 fl (ref 78.0–100.0)
Platelets: 205 10*3/uL (ref 150.0–400.0)
RBC: 4.46 Mil/uL (ref 4.22–5.81)
RDW: 13.1 % (ref 11.5–15.5)
WBC: 7.7 10*3/uL (ref 4.0–10.5)

## 2017-10-11 LAB — HEMOGLOBIN A1C: HEMOGLOBIN A1C: 5.1 % (ref 4.6–6.5)

## 2017-10-11 NOTE — Progress Notes (Signed)
Patient ID: Francisco Cowan, male  DOB: 23-Feb-1951, 67 y.o.   MRN: [id number] Patient Care Team    Relationship Specialty Notifications Start End  Ma Hillock, DO PCP - General Family Medicine  10/11/17   Melina Schools, MD Consulting Physician Orthopedic Surgery  10/12/17     Chief Complaint  Patient presents with  . Establish Care    Subjective:  Francisco Cowan is a 67 y.o.  male present for new patient establishment. All past medical history, surgical history, allergies, family history, immunizations, medications and social history were updated in the electronic medical record today. All recent labs, ED visits and hospitalizations within the last year were reviewed.  Patient lives in [place] greater than 6 months out of the year.  He states he usually leaves in November yearly.  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 His chronic medical conditions and medications are all provided by his doctors in [place].  Presents today because he was told he needed to establish with a local PCP prior to undergoing surgery for his spinal stenosis with Dr. Rolena Infante.  He is hoping to be scheduled in September, date has not been set.  He is uncertain why he needed a PCP locally.  He had surgical clearance by his cardiologist in [place] last month.  Records of 2 office visits from his cardiologist received. By those records patient has a past medical history of anxiety , depression, nephrolithiasis/hematuria, alcohol and THC dependence, urge incontinence/ED, photodermatitis, hypertension, spinal stenosis.  By reported med list he is prescribed trazodone, Protonix, Celexa, Wellbutrin, Neurontin, Norvasc, Singulair, lisinopril, Ativan, Flomax, Flonase by his [place] providers.   Depression screen [facility] 2/9 10/11/2017  Decreased Interest 0  Down, Depressed, Hopeless 0  PHQ - 2 Score 0    Altered sleeping 0  Tired, decreased energy 0  Change in appetite 0  Feeling bad or failure about yourself  0  Trouble concentrating 0  Moving slowly or fidgety/restless 0  Suicidal thoughts 0  PHQ-9 Score 0  Difficult doing work/chores Not difficult at all   GAD 7 : Generalized Anxiety Score 10/11/2017  Nervous, Anxious, on Edge 0  Control/stop worrying 0  Worry too much - different things 0  Trouble relaxing 0  Restless 0  Easily annoyed or irritable 0  Afraid - awful might happen 0  Total GAD 7 Score 0  Anxiety Difficulty Not difficult at all      Fall Risk  10/11/2017  Falls in the past year? No    There is no immunization history on file for this patient.  No exam data present  Past Medical History:  Diagnosis Date  . Abnormal ECG 09/19/2017   NSR. Left axis deviation.   . Alcohol abuse   . Anxiety   . Depression   . ED (erectile dysfunction)   . Hypertension   . Moderate tetrahydrocannabinol (THC) dependence ([place])   . Nephrolithiasis   . Photodermatitis   . Spinal stenosis   . Urge incontinence    Not on File Past Surgical History:  Procedure Laterality Date  . CARPAL TUNNEL RELEASE Bilateral 1991  . CYST REMOVAL TRUNK N/A 07/2014   back  . ROTATOR CUFF REPAIR Left 2003/2018  . ROTATOR CUFF REPAIR Right 02/10/2014  . Study: Echo  06/27/2016   LV size normal.  LV wall thickness normal.  LVEF 55-60%.  Diastolic filling pattern indicates impaired relaxation.  GLS test 17%.  LA is mildly dilated.  Moderate AV sclerosis without stenosis.  Trace MR.  RVSP 22.25 mmHg p  . ULNAR NERVE TRANSPOSITION  2018   Family History  Problem Relation Age of Onset  . Bipolar disorder Mother   . Arthritis Mother   . Depression Mother   . Stroke Mother   . Angina Father   . Hypertension Father   . Stroke Father   . Lung cancer Brother   . COPD Brother   . Hearing loss Brother   . Colon cancer Paternal Aunt   . Bone cancer Paternal Uncle    Social History    Socioeconomic History  . Marital status: Married    Spouse name: Not on file  . Number of children: 2  . Years of education: Not on file  . Highest education level: Not on file  Occupational History  . Occupation: Chief Financial Officer  Social Needs  . Financial resource strain: Not hard at all  . Food insecurity:    Worry: Never true    Inability: Never true  . Transportation needs:    Medical: No    Non-medical: No  Tobacco Use  . Smoking status: Former Smoker    Last attempt to quit: 2002    Years since quitting: 17.6  . Smokeless tobacco: Never Used  Substance and Sexual Activity  . Alcohol use: Yes    Alcohol/week: 2.0 standard drinks    Types: 2 Cans of beer per week    Comment: daily  . Drug use: Yes    Types: Marijuana, Benzodiazepines  . Sexual activity: Yes    Partners: Female  Lifestyle  . Physical activity:    Days per week: 4 days    Minutes per session: 20 min  . Stress: Not on file  Relationships  . Social connections:    Talks on phone: Three times a week    Gets together: Once a week    Attends religious service: Never    Active member of club or organization: No    Attends meetings of clubs or organizations: Never    Relationship status: Married  . Intimate partner violence:    Fear of current or ex partner: No    Emotionally abused: No    Physically abused: No    Forced sexual activity: No  Other Topics Concern  . Not on file  Social History Narrative   Marital status/children/pets: married. Lives in [place] 6 months out of the year (usually leaves in November yearly to [place]).   Education/employment: Some college, Chief Financial Officer.    Daily ETOH and THC use ( on records 02/2017).    Safety:      -smoke alarm in the home:Yes     - wears seatbelt: Yes     - Feels safe in their relationships: Yes   Allergies as of 10/11/2017   Not on File     Medication List        Accurate as of 10/11/17 11:59 PM. Always use your most  recent med list.          amLODipine 2.5 MG tablet Commonly known as:  NORVASC Take 2.5 mg by mouth daily.   buPROPion 300 MG 24 hr tablet Commonly known as:  WELLBUTRIN XL Take 300 mg by mouth daily.   citalopram 40 MG tablet Commonly known as:  CELEXA Take 40 mg by mouth daily.   fluticasone 50 MCG/ACT nasal spray Commonly known as:  FLONASE Place into both nostrils daily.   gabapentin 300 MG capsule Commonly known as:  NEURONTIN Take 300 mg by mouth 3 (three) times daily.   lisinopril 10 MG tablet Commonly known as:  PRINIVIL,ZESTRIL Take 10 mg by mouth daily.   LORazepam 1 MG tablet Commonly known as:  ATIVAN Take 1 mg by mouth every 8 (eight) hours.   montelukast 10 MG tablet Commonly known as:  SINGULAIR Take 10 mg by mouth at bedtime.   multivitamin capsule Take 1 capsule by mouth daily.   pantoprazole 40 MG tablet Commonly known as:  PROTONIX Take 40 mg by mouth daily.   tamsulosin 0.4 MG Caps capsule Commonly known as:  FLOMAX Take 0.4 mg by mouth.   traZODone 150 MG tablet Commonly known as:  DESYREL Take by mouth at bedtime.       All past medical history, surgical history, allergies, family history, immunizations andmedications were updated in the EMR today and reviewed under the history and medication portions of their EMR.    Recent Results (from the past 2160 hour(s))  CBC     Status: None   Collection Time: 10/11/17  2:31 PM  Result Value Ref Range   WBC 7.7 4.0 - 10.5 K/uL   RBC 4.46 4.22 - 5.81 Mil/uL   Platelets 205.0 150.0 - 400.0 K/uL   Hemoglobin 14.5 13.0 - 17.0 g/dL   HCT 42.6 39.0 - 52.0 %   MCV 95.7 78.0 - 100.0 fl   MCHC 33.9 30.0 - 36.0 g/dL   RDW 13.1 11.5 - 15.5 %  HgB A1c     Status: None   Collection Time: 10/11/17  2:31 PM  Result Value Ref Range   Hgb A1c MFr Bld 5.1 4.6 - 6.5 %    Comment: Glycemic Control Guidelines for People with Diabetes:Non Diabetic:  <6%Goal of Therapy: <7%Additional Action Suggested:   >8%   Comp Met (CMET)     Status: None   Collection Time: 10/11/17  2:31 PM  Result Value Ref Range   Sodium 141 135 - 145 mEq/L   Potassium 3.9 3.5 - 5.1 mEq/L   Chloride 105 96 - 112 mEq/L   CO2 30 19 - 32 mEq/L   Glucose, Bld 97 70 - 99 mg/dL   BUN 17 6 - 23 mg/dL   Creatinine, Ser 1.02 0.40 - 1.50 mg/dL   Total Bilirubin 0.4 0.2 - 1.2 mg/dL   Alkaline Phosphatase 56 39 - 117 U/L   AST 25 0 - 37 U/L   ALT 20 0 - 53 U/L   Total Protein 6.6 6.0 - 8.3 g/dL   Albumin 4.1 3.5 - 5.2 g/dL   Calcium 9.5 8.4 - 10.5 mg/dL   GFR 77.56 >60.00 mL/min    Patient was never admitted.   ROS: 14 pt review of systems performed and negative (unless mentioned in an HPI)  Objective: BP 128/75 (BP Location: Left Arm, Patient Position: Sitting, Cuff Size: Large)   Pulse 93   Temp (!) 97 F (36.1 C) (Oral)   Resp 16   Ht '5\' 7"'$  (1.702 m)   Wt 222 lb (100.7 kg)   SpO2 97%   BMI 34.77 kg/m  Gen: Afebrile. No acute distress. Nontoxic in appearance, well-developed, well-nourished, pleasant , obese Caucasian male walking with a cane HENT: AT. [place].  MMM. no Cough on exam, no hoarseness on exam. Eyes:Pupils Equal Round Reactive to light, Extraocular movements intact,  Conjunctiva without redness, discharge or icterus. Neck/lymp/endocrine: [employer]  lymphadenopathy, no thyromegaly CV: RRR no murmur, no edema, +2/4 P posterior tibialis pulses. no carotid bruits. No JVD. Chest: CTAB, no wheeze, rhonchi or crackles. normal Respiratory effort. good Air movement. Abd: Soft. obese. NTND. BS present.  Skin: no rashes, purpura or petechiae. Warm and well-perfused. Skin intact. Neuro/Msk:  Walking with cane/walking stick. PERLA. EOMi. Alert. Oriented x3.  Marland Kitchen Psych: Normal affect, dress and demeanor. Normal speech. Normal thought content and judgment.  Assessment/plan: Francisco Cowan is a 67 y.o. male present for establishment of care Essential hypertension - stable. Continue lisinopril and amlodipine  and follow ups w/ [place] PCP.  - CBC today--> WNL - Comp Met (CMET) today --> WNL - He has his cardiologist clearance from [place] dated last month with EKG.   Elevated glucose - HgB A1c collected today and normal.   Spinal stenosis, unspecified spinal region - planning upcoming surgery with Dr. Rolena Infante. - He has his cardiologist clearance from [place] dated last month with EKG.   He is managed for all chronic medical conditions by his [employer].   Greater than 30 minutes spent with patient, >50% of time spent face to face counseling and/or coordinating care.   Note is dictated utilizing voice recognition software. Although note has been proof read prior to signing, occasional typographical errors still can be missed. If any questions arise, please do not hesitate to call for verification.  Electronically signed by: Howard Pouch, DO [place]

## 2017-10-11 NOTE — Patient Instructions (Signed)
We will collect basic labs for a baseline, in the event you may need them.  It was a pleasure meeting you today.  Contact Dr. Rolena Infante office and let them know you have your cardiologist clearance forms and you saw me to establish if needed . If he is asking for a formal clearance from [place], then he will need to forward a form and we will do our best.   Please help [place] help you:  We are honored you have chosen [facility] for your Primary Care home. Below you will find basic instructions that you may need to access in the future. Please help [place] help you by reading the instructions, which cover many of the frequent questions we experience.   Prescription refills and request:  -In order to allow more efficient response time, please call your pharmacy for all refills. They will forward the request electronically to [place]. This allows for the quickest possible response. Request left on a nurse line can take longer to refill, since these are checked as time allows between office patients and other phone calls.  - refill request can take up to 3-5 working days to complete.  - If request is sent electronically and request is appropiate, it is usually completed in 1-2 business days.  - all patients will need to be seen routinely for all chronic medical conditions requiring prescription medications (see follow-up below). If you are overdue for follow up on your condition, you will be asked to make an appointment and we will call in enough medication to cover you until your appointment (up to 30 days).  - all controlled substances will require a face to face visit to request/refill.  - if you desire your prescriptions to go through a new pharmacy, and have an active script at original pharmacy, you will need to call your pharmacy and have scripts transferred to new pharmacy. This is completed between the pharmacy locations and not by your provider.    Results: If any images or labs were ordered, it can take up  to 1 week to get results depending on the test ordered and the lab/facility running and resulting the test. - Normal or stable results, which do not need further discussion, may be released to your mychart immediately with attached note to you. A call may not be generated for normal results. Please make certain to sign up for mychart. If you have questions on how to activate your mychart you can call the front office.  - If your results need further discussion, our office will attempt to contact you via phone, and if unable to reach you after 2 attempts, we will release your abnormal result to your mychart with instructions.  - All results will be automatically released in mychart after 1 week.  - Your provider will provide you with explanation and instruction on all relevant material in your results. Please keep in mind, results and labs may appear confusing or abnormal to the untrained eye, but it does not mean they are actually abnormal for you personally. If you have any questions about your results that are not covered, or you desire more detailed explanation than what was provided, you should make an appointment with your provider to do so.   Our office handles many outgoing and incoming calls daily. If we have not contacted you within 1 week about your results, please check your mychart to see if there is a message first and if not, then contact our office.  In helping with this matter, you help decrease call volume, and therefore allow [place] to be able to respond to patients needs more efficiently.   Acute office visits (sick visit):  An acute visit is intended for a new problem and are scheduled in shorter time slots to allow schedule openings for patients with new problems. This is the appropriate visit to discuss a new problem. Problems will not be addressed by phone call or Echart message. Appointment is needed if requesting treatment. In order to provide you with excellent quality medical care  with proper time for you to explain your problem, have an exam and receive treatment with instructions, these appointments should be limited to one new problem per visit. If you experience a new problem, in which you desire to be addressed, please make an acute office visit, we save openings on the schedule to accommodate you. Please do not save your new problem for any other type of visit, let us take care of it properly and quickly for you.   Follow up visits:  Depending on your condition(s) your provider will need to see you routinely in order to provide you with quality care and prescribe medication(s). Most chronic conditions (Example: hypertension, Diabetes, depression/anxiety... etc), require visits a couple times a year. Your provider will instruct you on proper follow up for your personal medical conditions and history. Please make certain to make follow up appointments for your condition as instructed. Failing to do so could result in lapse in your medication treatment/refills. If you request a refill, and are overdue to be seen on a condition, we will always provide you with a 30 day script (once) to allow you time to schedule.    Medicare wellness (well visit): - we have a wonderful Nurse Maudie Mercury), that will meet with you and provide you will yearly medicare wellness visits. These visits should occur yearly (can not be scheduled less than 1 calendar year apart) and cover preventive health, immunizations, advance directives and screenings you are entitled to yearly through your medicare benefits. Do not miss out on your entitled benefits, this is when medicare will pay for these benefits to be ordered for you.  These are strongly encouraged by your provider and is the appropriate type of visit to make certain you are up to date with all preventive health benefits. If you have not had your medicare wellness exam in the last 12 months, please make certain to schedule one by calling the office and  schedule your medicare wellness with Maudie Mercury as soon as possible.   Yearly physical (well visit):  - Adults are recommended to be seen yearly for physicals. Check with your insurance and date of your last physical, most insurances require one calendar year between physicals. Physicals include all preventive health topics, screenings, medical exam and labs that are appropriate for gender/age and history. You may have fasting labs needed at this visit. This is a well visit (not a sick visit), new problems should not be covered during this visit (see acute visit).  - Pediatric patients are seen more frequently when they are younger. Your provider will advise you on well child visit timing that is appropriate for your their age. - This is not a medicare wellness visit. Medicare wellness exams do not have an exam portion to the visit. Some medicare companies allow for a physical, some do not allow a yearly physical. If your medicare allows a yearly physical you can schedule the medicare wellness with our nurse Maudie Mercury  and have your physical with your provider after, on the same day. Please check with insurance for your full benefits.   Late Policy/No Shows:  - all new patients should arrive 15-30 minutes earlier than appointment to allow [place] time  to  obtain all personal demographics,  insurance information and for you to complete office paperwork. - All established patients should arrive 10-15 minutes earlier than appointment time to update all information and be checked in .  - In our best efforts to run on time, if you are late for your appointment you will be asked to either reschedule or if able, we will work you back into the schedule. There will be a wait time to work you back in the schedule,  depending on availability.  - If you are unable to make it to your appointment as scheduled, please call 24 hours ahead of time to allow [place] to fill the time slot with someone else who needs to be seen. If you do not  cancel your appointment ahead of time, you may be charged a no show fee.

## 2017-10-12 ENCOUNTER — Encounter: Payer: Self-pay | Admitting: Family Medicine

## 2017-10-12 DIAGNOSIS — F411 Generalized anxiety disorder: Secondary | ICD-10-CM | POA: Insufficient documentation

## 2017-10-12 DIAGNOSIS — M4802 Spinal stenosis, cervical region: Secondary | ICD-10-CM | POA: Insufficient documentation

## 2017-10-12 DIAGNOSIS — T7840XA Allergy, unspecified, initial encounter: Secondary | ICD-10-CM | POA: Insufficient documentation

## 2017-10-12 DIAGNOSIS — M4800 Spinal stenosis, site unspecified: Secondary | ICD-10-CM | POA: Insufficient documentation

## 2017-10-12 DIAGNOSIS — K219 Gastro-esophageal reflux disease without esophagitis: Secondary | ICD-10-CM | POA: Insufficient documentation

## 2017-10-12 DIAGNOSIS — F320 Major depressive disorder, single episode, mild: Secondary | ICD-10-CM | POA: Insufficient documentation

## 2017-11-10 NOTE — Pre-Procedure Instructions (Signed)
Francisco Cowan  11/10/2017     No Pharmacies Listed   Your procedure is scheduled on November 22, 2017.  Report to [facility] Admitting at 1030 AM.  Call this number if you have problems the morning of surgery:  [phone]   Remember:  Do not eat or drink after midnight.                    Take these medicines the morning of surgery with A SIP OF WATER  Amlodipine (norvasc) Bupropion (wellbutrin) Citalopram (celexa) flonase nasal spray-if needed Gabapentin (neurontin Lorazepam (ativan) Pantoprazole (protonix) tamsulosin (flomax)  7 days prior to surgery STOP taking any Aspirin (unless otherwise instructed by your surgeon), Aleve, Naproxen, Ibuprofen, Motrin, Advil, Goody's, BC's, all herbal medications, fish oil, and all vitamins    Do not wear jewelry  Do not wear lotions, powders, or colognes, or deodorant.  Men may shave face and neck.  Do not bring valuables to the hospital.  [facility] is not responsible for any belongings or valuables.  Contacts, dentures or bridgework may not be worn into surgery.  Leave your suitcase in the car.  After surgery it may be brought to your room.  For patients admitted to the hospital, discharge time will be determined by your treatment team.  Patients discharged the day of surgery will not be allowed to drive home.    [facility]- Preparing For Surgery  Before surgery, you can play an important role. Because skin is not sterile, your skin needs to be as free of germs as possible. You can reduce the number of germs on your skin by washing with CHG (chlorahexidine gluconate) Soap before surgery.  CHG is an antiseptic cleaner which kills germs and bonds with the skin to continue killing germs even after washing.    Oral Hygiene is also important to reduce your risk of infection.  Remember - BRUSH YOUR TEETH THE MORNING OF SURGERY WITH YOUR REGULAR TOOTHPASTE  Please do not use if you have an allergy to CHG or  antibacterial soaps. If your skin becomes reddened/irritated stop using the CHG.  Do not shave (including legs and underarms) for at least 48 hours prior to first CHG shower. It is OK to shave your face.  Please follow these instructions carefully.   1. Shower the NIGHT BEFORE SURGERY and the MORNING OF SURGERY with CHG.   2. If you chose to wash your hair, wash your hair first as usual with your normal shampoo.  3. After you shampoo, rinse your hair and body thoroughly to remove the shampoo.  4. Use CHG as you would any other liquid soap. You can apply CHG directly to the skin and wash gently with a scrungie or a clean washcloth.   5. Apply the CHG Soap to your body ONLY FROM THE NECK DOWN.  Do not use on open wounds or open sores. Avoid contact with your eyes, ears, mouth and genitals (private parts). Wash Face and genitals (private parts)  with your normal soap.  6. Wash thoroughly, paying special attention to the area where your surgery will be performed.  7. Thoroughly rinse your body with warm water from the neck down.  8. DO NOT shower/wash with your normal soap after using and rinsing off the CHG Soap.  9. Pat yourself dry with a CLEAN TOWEL.  10. Wear CLEAN PAJAMAS to bed the night before surgery, wear comfortable clothes the morning of surgery  11.  Place CLEAN SHEETS on your bed the night of your first shower and DO NOT SLEEP WITH PETS.  Day of Surgery:  Do not apply any deodorants/lotions.  Please wear clean clothes to the hospital/surgery center.   Remember to brush your teeth WITH YOUR REGULAR TOOTHPASTE.  Please read over the following fact sheets that you were given.

## 2017-11-13 ENCOUNTER — Encounter (HOSPITAL_COMMUNITY)
Admission: RE | Admit: 2017-11-13 | Discharge: 2017-11-13 | Disposition: A | Discharge status: Home / Self Care | Payer: Medicare Other | Source: Ambulatory Visit | Attending: Orthopedic Surgery | Admitting: Orthopedic Surgery

## 2017-11-13 ENCOUNTER — Encounter (HOSPITAL_COMMUNITY): Payer: Self-pay

## 2017-11-13 ENCOUNTER — Other Ambulatory Visit: Payer: Self-pay

## 2017-11-13 DIAGNOSIS — F419 Anxiety disorder, unspecified: Secondary | ICD-10-CM | POA: Insufficient documentation

## 2017-11-13 DIAGNOSIS — I10 Essential (primary) hypertension: Secondary | ICD-10-CM | POA: Diagnosis not present

## 2017-11-13 DIAGNOSIS — M109 Gout, unspecified: Secondary | ICD-10-CM | POA: Diagnosis not present

## 2017-11-13 DIAGNOSIS — Z01812 Encounter for preprocedural laboratory examination: Secondary | ICD-10-CM | POA: Diagnosis not present

## 2017-11-13 DIAGNOSIS — K219 Gastro-esophageal reflux disease without esophagitis: Secondary | ICD-10-CM | POA: Insufficient documentation

## 2017-11-13 DIAGNOSIS — M4316 Spondylolisthesis, lumbar region: Secondary | ICD-10-CM | POA: Diagnosis not present

## 2017-11-13 DIAGNOSIS — F329 Major depressive disorder, single episode, unspecified: Secondary | ICD-10-CM | POA: Insufficient documentation

## 2017-11-13 DIAGNOSIS — Z87891 Personal history of nicotine dependence: Secondary | ICD-10-CM | POA: Diagnosis not present

## 2017-11-13 DIAGNOSIS — Z79899 Other long term (current) drug therapy: Secondary | ICD-10-CM | POA: Insufficient documentation

## 2017-11-13 HISTORY — DX: Gastro-esophageal reflux disease without esophagitis: K21.9

## 2017-11-13 HISTORY — DX: Personal history of urinary calculi: Z87.442

## 2017-11-13 LAB — CBC
HCT: 45.4 % (ref 39.0–52.0)
Hemoglobin: 15 g/dL (ref 13.0–17.0)
MCH: 32.3 pg (ref 26.0–34.0)
MCHC: 33 g/dL (ref 30.0–36.0)
MCV: 97.6 fL (ref 78.0–100.0)
PLATELETS: 181 10*3/uL (ref 150–400)
RBC: 4.65 MIL/uL (ref 4.22–5.81)
RDW: 12.1 % (ref 11.5–15.5)
WBC: 7.4 10*3/uL (ref 4.0–10.5)

## 2017-11-13 LAB — COMPREHENSIVE METABOLIC PANEL
ALT: 21 U/L (ref 0–44)
AST: 26 U/L (ref 15–41)
Albumin: 3.9 g/dL (ref 3.5–5.0)
Alkaline Phosphatase: 49 U/L (ref 38–126)
Anion gap: 9 (ref 5–15)
BILIRUBIN TOTAL: 1.1 mg/dL (ref 0.3–1.2)
BUN: 9 mg/dL (ref 8–23)
CHLORIDE: 101 mmol/L (ref 98–111)
CO2: 29 mmol/L (ref 22–32)
CREATININE: 1.07 mg/dL (ref 0.61–1.24)
Calcium: 9.6 mg/dL (ref 8.9–10.3)
Glucose, Bld: 116 mg/dL — ABNORMAL HIGH (ref 70–99)
POTASSIUM: 4.5 mmol/L (ref 3.5–5.1)
Sodium: 139 mmol/L (ref 135–145)
Total Protein: 6.6 g/dL (ref 6.5–8.1)

## 2017-11-13 LAB — TYPE AND SCREEN
ABO/RH(D): A POS
Antibody Screen: NEGATIVE

## 2017-11-13 LAB — SURGICAL PCR SCREEN
MRSA, PCR: NEGATIVE
Staphylococcus aureus: NEGATIVE

## 2017-11-13 LAB — ABO/RH: ABO/RH(D): A POS

## 2017-11-13 NOTE — Progress Notes (Addendum)
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 Pertinent hx includes HTN, Anxiety, GERD, Gout, Depression, ETOH and THC abuse.  Pt lives in [place] >36months of the year and receives ongoing cardiology, gastroenterology, urology, psychiatry, and PCP care there.  He has cardiac clearance from [facility] scanned under media tab. Per note dated 09/19/2017 "He is thinking of back surgery later this summer.  He can be cleared from a cardiac standpoint.  No additional cardiac testing is needed.  His risk of major cardiac complications is considered low."  Anticipate he can proceed as planned barring acute status change.  VS: BP 140/75   Pulse 75   Temp 36.7 C   Resp 20   Ht 5' 6.5" (1.689 m)   Wt 98.7 kg   SpO2 99%   BMI 34.61 kg/m   PROVIDERS: Kuneff, Renee A, DO  Is PCP  Freda Jackson, MD is Cardiologist  LABS: Labs reviewed: Acceptable for surgery. (all labs ordered are listed, but only abnormal results are displayed)  Labs Reviewed  COMPREHENSIVE METABOLIC PANEL - Abnormal; Notable for the following components:      Result Value   Glucose, Bld 116 (*)    All other components within normal limits  SURGICAL PCR SCREEN  CBC  TYPE AND SCREEN  ABO/RH     IMAGES: N/A   EKG: 09/19/2017: NSR, LAD  CV: TTE 06/27/3016: LV size normal. LV wall thickness normal. LVEF 55-60%. Diastolic filling pattern indicates impaired relaxation. GLS test 17%. LA is mildly dilated. Moderate AV sclerosis without stenosis. Trace MR. RVSP 22.25 mmHg p  Past Medical History:  Diagnosis Date  . Abnormal ECG 09/19/2017   NSR. Left axis deviation.    . Alcohol abuse   . Anxiety   . Depression   . ED (erectile dysfunction)   . GERD (gastroesophageal reflux disease)   . History of kidney stones   . Hypertension   . Moderate tetrahydrocannabinol (THC) dependence ([place])   . Photodermatitis   . Spinal stenosis   . Urge incontinence     Past Surgical History:  Procedure Laterality Date  . CARPAL TUNNEL RELEASE Bilateral 1991  . COLONOSCOPY    . CYST REMOVAL TRUNK N/A 07/2014   back  . ESOPHAGOGASTRODUODENOSCOPY    . ROTATOR CUFF REPAIR Left 2003/2018  . ROTATOR CUFF REPAIR Right 02/10/2014  . Study: Echo  06/27/2016   LV size normal.  LV wall thickness normal.  LVEF 55-60%.  Diastolic filling pattern indicates impaired relaxation.  GLS test 17%.  LA is mildly dilated.  Moderate AV sclerosis without stenosis.  Trace MR.  RVSP 22.25 mmHg p  . ULNAR NERVE TRANSPOSITION Left 2018    MEDICATIONS: . amLODipine (NORVASC) 2.5 MG tablet  . buPROPion (WELLBUTRIN SR) 150 MG 12 hr tablet  . buPROPion (WELLBUTRIN XL) 300 MG 24 hr tablet  . citalopram (CELEXA) 40 MG tablet  . fluticasone (FLONASE) 50 MCG/ACT nasal spray  . lisinopril (PRINIVIL,ZESTRIL) 10 MG tablet  . LORazepam (ATIVAN) 1 MG tablet  . montelukast (SINGULAIR) 10 MG tablet  . Multiple Vitamin (MULTIVITAMIN) capsule  . pantoprazole (  PROTONIX) 40 MG tablet  . tamsulosin (FLOMAX) 0.4 MG CAPS capsule  . traZODone (DESYREL) 150 MG tablet   No current facility-administered medications for this encounter.      [facility]/Anesthesiology Phone [phone] 11/14/2017 12:46 PM

## 2017-11-17 DIAGNOSIS — M545 Low back pain: Secondary | ICD-10-CM | POA: Diagnosis not present

## 2017-11-17 NOTE — H&P (Addendum)
Patient ID: Francisco Cowan MRN: [id number] DOB/AGE: 66-Apr-1952 66 y.o.  Admit date: (Not on file)  Admission Diagnoses:  Lumbar 4-5 spondylolisthesis with b/l cyst  HPI: The patient is here today for a pre-operative History and Physical. They are scheduled for TLIF L4-5   on 11-22-17 with Dr. Rolena Infante at  [facility].  Past Medical History: Past Medical History:  Diagnosis Date  . Abnormal ECG 09/19/2017   NSR. Left axis deviation.   . Alcohol abuse   . Anxiety   . Depression   . ED (erectile dysfunction)   . GERD (gastroesophageal reflux disease)   . History of kidney stones   . Hypertension   . Moderate tetrahydrocannabinol (THC) dependence ([place])   . Photodermatitis   . Spinal stenosis   . Urge incontinence     Surgical History: Past Surgical History:  Procedure Laterality Date  . CARPAL TUNNEL RELEASE Bilateral 1991  . COLONOSCOPY    . CYST REMOVAL TRUNK N/A 07/2014   back  . ESOPHAGOGASTRODUODENOSCOPY    . ROTATOR CUFF REPAIR Left 2003/2018  . ROTATOR CUFF REPAIR Right 02/10/2014  . Study: Echo  06/27/2016   LV size normal.  LV wall thickness normal.  LVEF 55-60%.  Diastolic filling pattern indicates impaired relaxation.  GLS test 17%.  LA is mildly dilated.  Moderate AV sclerosis without stenosis.  Trace MR.  RVSP 22.25 mmHg p  . ULNAR NERVE TRANSPOSITION Left 2018    Family History: Family History  Problem Relation Age of Onset  . Bipolar disorder Mother   . Arthritis Mother   . Depression Mother   . Stroke Mother   . Angina Father   . Hypertension Father   . Stroke Father   . Lung cancer Brother   . COPD Brother   . Hearing loss Brother   . Colon cancer Paternal Aunt   . Bone cancer Paternal Uncle     Social History: Social History   Socioeconomic History  . Marital status: Married    Spouse name: Not on file  . Number of children: 2  . Years of education: Not on file  . Highest education level: Not on file  Occupational  History  . Occupation: Chief Financial Officer  Social Needs  . Financial resource strain: Not hard at all  . Food insecurity:    Worry: Never true    Inability: Never true  . Transportation needs:    Medical: No    Non-medical: No  Tobacco Use  . Smoking status: Former Smoker    Years: 31.00    Last attempt to quit: 2002    Years since quitting: 17.7  . Smokeless tobacco: Never Used  Substance and Sexual Activity  . Alcohol use: Yes    Alcohol/week: 28.0 standard drinks    Types: 14 Cans of beer, 14 Shots of liquor per week  . Drug use: Not Currently    Types: Marijuana, Benzodiazepines  . Sexual activity: Yes    Partners: Female  Lifestyle  . Physical activity:    Days per week: 4 days    Minutes per session: 20 min  . Stress: Not on file  Relationships  . Social connections:    Talks on phone: Three times a week    Gets together: Once a week    Attends religious service: Never    Active member of club or organization: No    Attends meetings of clubs or organizations: Never    Relationship status: Married  .  Intimate partner violence:    Fear of current or ex partner: No    Emotionally abused: No    Physically abused: No    Forced sexual activity: No  Other Topics Concern  . Not on file  Social History Narrative   Marital status/children/pets: married. Lives in [place] 6 months out of the year (usually leaves in November yearly to [place]).   Education/employment: Some college, Chief Financial Officer.    Daily ETOH and THC use ( on records 02/2017).    Safety:      -smoke alarm in the home:Yes     - wears seatbelt: Yes     - Feels safe in their relationships: Yes    Allergies: Cortisone and Nsaids  Medications: I have reviewed the patient's current medications.  Vital Signs: No data found.  Radiology: No results found.  Labs: No results for input(s): WBC, RBC, HCT, PLT in the last 72 hours. No results for input(s): NA, K, CL, CO2, BUN,  CREATININE, GLUCOSE, CALCIUM in the last 72 hours. No results for input(s): LABPT, INR in the last 72 hours.  Review of Systems: ROS  Physical Exam: There is no height or weight on file to calculate BMI.  Physical Exam  Constitutional: He is oriented to person, place, and time. He appears well-developed and well-nourished.  HENT:  Head: Normocephalic.  Cardiovascular: Normal rate and regular rhythm.  Respiratory: Effort normal and breath sounds normal.  GI: Soft. Bowel sounds are normal.  Neurological: He is alert and oriented to person, place, and time.  Skin: Skin is warm and dry.  Psychiatric: He has a normal mood and affect. His behavior is normal. Judgment and thought content normal.   Gait pattern: Ambulates in a forward flexed position with a walking stick.  Neutral posture or extension of the spine elicits increased back buttock and bilateral leg pain left worse than the right.  Assistive devices: Walking stick  Neuro: 5 out of 5 strength in the lower extremities bilaterally.  Negative straight leg raise test bilaterally.  Positive dysesthesias/numbness in the L5 dermatome bilaterally.  1+ symmetrical deep tendon reflexes in the lower extremities.  Negative Babinski test, no clonus.  Musculoskeletal: Significant back pain with extension of the spine.  Negative SI joint pain with direct palpation.  Relief of symptoms with forward flexion of the spine.  No significant hip, knee, ankle pain with isolated joint range of motion.  Lumbar x-rays dated 08/01/17: Low-grade L4-5 spondylolisthesis is noted.  Mild to moderate degenerative changes L2-3, L3-4, and L4-5.  No acute fracture is seen.  Lumbar MRI dated 08/09/17: Mild scoliosis of the lumbar spine is noted with minimal retrolisthesis of L3 on L4.  There is a grade 1 anterolisthesis L4 and L5.  Multilevel degenerative disc disease throughout the lumbar spine.  Small epidural inclusion cyst seen within the L4 spinous process.  Mild to  moderate stenosis L1-L4.  L3-4 has moderate degenerative changes.  L4-5 bilateral synovial cysts causing encroachment on the traversing L5 nerve root.  Right side has a large anterior medial and inferior synovial cyst causing marked compression of the descending  L5 nerve root.    Assessment and Plan: Risks and benefits of surgery were discussed with the patient. These include: Infection, bleeding, death, stroke, paralysis, ongoing or worse pain, need for additional surgery, nonunion, leak of spinal fluid, adjacent segment degeneration requiring additional fusion surgery, Injury to abdominal vessels that can require anterior surgery to stop bleeding. Malposition of the cage and/or  pedicle screws that could require additional surgery. Loss of bowel and bladder control. Postoperative hematoma causing neurologic compression that could require urgent or emergent re-operation.  Goals of surgery: Reduced (not eliminated) pain, and improved quality of life.  Ronette Deter, PAC for Melina Schools, MD [facility] [phone]  No change in patient's clinical exam from last office visit of 11/17/2017.  Continues to have significant back buttock and neuropathic leg pain left worse than the right.  Due to the failure of conservative management to alleviate his symptoms and improve his quality of life and ongoing severe debilitating back buttock and radicular pain he is elected to move forward with surgery.  Plan is resection of the compression and instrumented fusion via transforaminal lumbar interbody fusion approach.  All appropriate risks benefits and alternatives were reviewed again with the patient and his family.

## 2017-11-22 ENCOUNTER — Inpatient Hospital Stay (HOSPITAL_COMMUNITY): Payer: Medicare Other | Admitting: Physician Assistant

## 2017-11-22 ENCOUNTER — Inpatient Hospital Stay (HOSPITAL_COMMUNITY): Payer: Medicare Other | Admitting: Anesthesiology

## 2017-11-22 ENCOUNTER — Encounter (HOSPITAL_COMMUNITY): Payer: Self-pay

## 2017-11-22 ENCOUNTER — Other Ambulatory Visit: Payer: Self-pay

## 2017-11-22 ENCOUNTER — Inpatient Hospital Stay (HOSPITAL_COMMUNITY)
Admission: RE | Disposition: A | Discharge status: Home / Self Care | Payer: Self-pay | Source: Home / Self Care | Attending: Orthopedic Surgery

## 2017-11-22 ENCOUNTER — Inpatient Hospital Stay (HOSPITAL_COMMUNITY): Payer: Medicare Other

## 2017-11-22 ENCOUNTER — Inpatient Hospital Stay (HOSPITAL_COMMUNITY)
Admission: RE | Admit: 2017-11-22 | Discharge: 2017-11-23 | DRG: 460 | Disposition: A | Discharge status: Home / Self Care | Payer: Medicare Other | Attending: Orthopedic Surgery | Admitting: Orthopedic Surgery

## 2017-11-22 DIAGNOSIS — Z818 Family history of other mental and behavioral disorders: Secondary | ICD-10-CM | POA: Diagnosis not present

## 2017-11-22 DIAGNOSIS — F419 Anxiety disorder, unspecified: Secondary | ICD-10-CM | POA: Diagnosis not present

## 2017-11-22 DIAGNOSIS — M5416 Radiculopathy, lumbar region: Secondary | ICD-10-CM | POA: Diagnosis present

## 2017-11-22 DIAGNOSIS — Z825 Family history of asthma and other chronic lower respiratory diseases: Secondary | ICD-10-CM

## 2017-11-22 DIAGNOSIS — Z888 Allergy status to other drugs, medicaments and biological substances status: Secondary | ICD-10-CM | POA: Diagnosis not present

## 2017-11-22 DIAGNOSIS — Z87891 Personal history of nicotine dependence: Secondary | ICD-10-CM | POA: Diagnosis not present

## 2017-11-22 DIAGNOSIS — Z8249 Family history of ischemic heart disease and other diseases of the circulatory system: Secondary | ICD-10-CM

## 2017-11-22 DIAGNOSIS — I10 Essential (primary) hypertension: Secondary | ICD-10-CM | POA: Diagnosis not present

## 2017-11-22 DIAGNOSIS — Z87442 Personal history of urinary calculi: Secondary | ICD-10-CM | POA: Diagnosis not present

## 2017-11-22 DIAGNOSIS — Z800 Family history of malignant neoplasm of digestive organs: Secondary | ICD-10-CM

## 2017-11-22 DIAGNOSIS — M4326 Fusion of spine, lumbar region: Secondary | ICD-10-CM | POA: Diagnosis not present

## 2017-11-22 DIAGNOSIS — M7138 Other bursal cyst, other site: Secondary | ICD-10-CM | POA: Diagnosis present

## 2017-11-22 DIAGNOSIS — Z801 Family history of malignant neoplasm of trachea, bronchus and lung: Secondary | ICD-10-CM | POA: Diagnosis not present

## 2017-11-22 DIAGNOSIS — Z419 Encounter for procedure for purposes other than remedying health state, unspecified: Secondary | ICD-10-CM

## 2017-11-22 DIAGNOSIS — F329 Major depressive disorder, single episode, unspecified: Secondary | ICD-10-CM | POA: Diagnosis present

## 2017-11-22 DIAGNOSIS — Z981 Arthrodesis status: Secondary | ICD-10-CM

## 2017-11-22 DIAGNOSIS — Z8261 Family history of arthritis: Secondary | ICD-10-CM | POA: Diagnosis not present

## 2017-11-22 DIAGNOSIS — K219 Gastro-esophageal reflux disease without esophagitis: Secondary | ICD-10-CM | POA: Diagnosis present

## 2017-11-22 DIAGNOSIS — M4316 Spondylolisthesis, lumbar region: Principal | ICD-10-CM | POA: Diagnosis present

## 2017-11-22 DIAGNOSIS — Z823 Family history of stroke: Secondary | ICD-10-CM | POA: Diagnosis not present

## 2017-11-22 HISTORY — PX: TRANSFORAMINAL LUMBAR INTERBODY FUSION (TLIF) WITH PEDICLE SCREW FIXATION 1 LEVEL: SHX6141

## 2017-11-22 SURGERY — TRANSFORAMINAL LUMBAR INTERBODY FUSION (TLIF) WITH PEDICLE SCREW FIXATION 1 LEVEL
Anesthesia: General | Site: Back

## 2017-11-22 MED ORDER — METHOCARBAMOL 1000 MG/10ML IJ SOLN: 500.0000 mg | Freq: Four times a day (QID) | INTRAVENOUS | Status: DC | PRN

## 2017-11-22 MED ORDER — HYDROMORPHONE HCL 1 MG/ML IJ SOLN
INTRAMUSCULAR | Status: AC
  Administered 2017-11-22: 0.5 mg via INTRAVENOUS
  Filled 2017-11-22: qty 1

## 2017-11-22 MED ORDER — ACETAMINOPHEN 10 MG/ML IV SOLN
INTRAVENOUS | Status: DC | PRN
  Administered 2017-11-22: 1000 mg via INTRAVENOUS

## 2017-11-22 MED ORDER — MIDAZOLAM HCL 2 MG/2ML IJ SOLN
INTRAMUSCULAR | Status: AC
  Filled 2017-11-22: qty 2

## 2017-11-22 MED ORDER — OXYCODONE-ACETAMINOPHEN 10-325 MG PO TABS: 1.0000 | ORAL_TABLET | ORAL | 0 refills | Status: AC | PRN

## 2017-11-22 MED ORDER — BUPIVACAINE-EPINEPHRINE 0.25% -1:200000 IJ SOLN
INTRAMUSCULAR | Status: DC | PRN
  Administered 2017-11-22: 10 mL

## 2017-11-22 MED ORDER — LIDOCAINE 2% (20 MG/ML) 5 ML SYRINGE
INTRAMUSCULAR | Status: DC | PRN
  Administered 2017-11-22: 100 mg via INTRAVENOUS

## 2017-11-22 MED ORDER — GELATIN ABSORBABLE 12-7 MM EX MISC
CUTANEOUS | Status: DC | PRN
  Administered 2017-11-22: 1 via TOPICAL

## 2017-11-22 MED ORDER — SUCCINYLCHOLINE CHLORIDE 200 MG/10ML IV SOSY
PREFILLED_SYRINGE | INTRAVENOUS | Status: AC
  Filled 2017-11-22: qty 10

## 2017-11-22 MED ORDER — LACTATED RINGERS IV SOLN
INTRAVENOUS | Status: DC
  Administered 2017-11-22 (×3): via INTRAVENOUS

## 2017-11-22 MED ORDER — HYDROMORPHONE HCL 1 MG/ML IJ SOLN
0.2500 mg | INTRAMUSCULAR | Status: DC | PRN
  Administered 2017-11-22 (×6): 0.5 mg via INTRAVENOUS

## 2017-11-22 MED ORDER — SUCCINYLCHOLINE CHLORIDE 200 MG/10ML IV SOSY
PREFILLED_SYRINGE | INTRAVENOUS | Status: DC | PRN
  Administered 2017-11-22: 100 mg via INTRAVENOUS

## 2017-11-22 MED ORDER — ONDANSETRON HCL 4 MG/2ML IJ SOLN
INTRAMUSCULAR | Status: DC | PRN
  Administered 2017-11-22: 4 mg via INTRAVENOUS

## 2017-11-22 MED ORDER — HEMOSTATIC AGENTS (NO CHARGE) OPTIME
TOPICAL | Status: DC | PRN
  Administered 2017-11-22: 1

## 2017-11-22 MED ORDER — ACETAMINOPHEN 10 MG/ML IV SOLN
INTRAVENOUS | Status: AC
  Filled 2017-11-22: qty 100

## 2017-11-22 MED ORDER — PHENYLEPHRINE 40 MCG/ML (10ML) SYRINGE FOR IV PUSH (FOR BLOOD PRESSURE SUPPORT)
PREFILLED_SYRINGE | INTRAVENOUS | Status: AC
  Filled 2017-11-22: qty 20

## 2017-11-22 MED ORDER — CEFAZOLIN SODIUM-DEXTROSE 2-4 GM/100ML-% IV SOLN
INTRAVENOUS | Status: AC
  Filled 2017-11-22: qty 100

## 2017-11-22 MED ORDER — ONDANSETRON HCL 4 MG/2ML IJ SOLN
INTRAMUSCULAR | Status: AC
  Filled 2017-11-22: qty 2

## 2017-11-22 MED ORDER — EPHEDRINE 5 MG/ML INJ
INTRAVENOUS | Status: AC
  Filled 2017-11-22: qty 10

## 2017-11-22 MED ORDER — DEXAMETHASONE SODIUM PHOSPHATE 10 MG/ML IJ SOLN
INTRAMUSCULAR | Status: DC | PRN
  Administered 2017-11-22: 5 mg via INTRAVENOUS

## 2017-11-22 MED ORDER — ONDANSETRON 4 MG PO TBDP: 4.0000 mg | ORAL_TABLET | Freq: Three times a day (TID) | ORAL | 0 refills | Status: DC | PRN

## 2017-11-22 MED ORDER — ALBUMIN HUMAN 5 % IV SOLN
INTRAVENOUS | Status: DC | PRN
  Administered 2017-11-22 (×2): via INTRAVENOUS

## 2017-11-22 MED ORDER — DEXAMETHASONE SODIUM PHOSPHATE 10 MG/ML IJ SOLN
INTRAMUSCULAR | Status: AC
  Filled 2017-11-22: qty 2

## 2017-11-22 MED ORDER — OXYCODONE HCL 5 MG PO TABS
ORAL_TABLET | ORAL | Status: AC
  Administered 2017-11-22: 10 mg via ORAL
  Filled 2017-11-22: qty 2

## 2017-11-22 MED ORDER — FENTANYL CITRATE (PF) 100 MCG/2ML IJ SOLN
INTRAMUSCULAR | Status: DC | PRN
  Administered 2017-11-22 (×4): 50 ug via INTRAVENOUS

## 2017-11-22 MED ORDER — SODIUM CHLORIDE 0.9 % IV SOLN
INTRAVENOUS | Status: DC | PRN
  Administered 2017-11-22: 40 ug/min via INTRAVENOUS

## 2017-11-22 MED ORDER — METHOCARBAMOL 500 MG PO TABS
500.0000 mg | ORAL_TABLET | Freq: Four times a day (QID) | ORAL | Status: DC | PRN
  Administered 2017-11-22 – 2017-11-23 (×2): 500 mg via ORAL
  Filled 2017-11-22 (×2): qty 1

## 2017-11-22 MED ORDER — PHENYLEPHRINE 40 MCG/ML (10ML) SYRINGE FOR IV PUSH (FOR BLOOD PRESSURE SUPPORT)
PREFILLED_SYRINGE | INTRAVENOUS | Status: DC | PRN
  Administered 2017-11-22: 120 ug via INTRAVENOUS

## 2017-11-22 MED ORDER — 0.9 % SODIUM CHLORIDE (POUR BTL) OPTIME
TOPICAL | Status: DC | PRN
  Administered 2017-11-22: 1000 mL

## 2017-11-22 MED ORDER — OXYCODONE HCL 5 MG PO TABS
10.0000 mg | ORAL_TABLET | ORAL | Status: DC | PRN
  Administered 2017-11-22 – 2017-11-23 (×5): 10 mg via ORAL
  Filled 2017-11-22 (×4): qty 2

## 2017-11-22 MED ORDER — THROMBIN (RECOMBINANT) 20000 UNITS EX SOLR
CUTANEOUS | Status: AC
  Filled 2017-11-22: qty 20000

## 2017-11-22 MED ORDER — METHOCARBAMOL 500 MG PO TABS: 500.0000 mg | ORAL_TABLET | Freq: Three times a day (TID) | ORAL | 0 refills | Status: DC

## 2017-11-22 MED ORDER — THROMBIN 20000 UNITS EX SOLR
CUTANEOUS | Status: DC | PRN
  Administered 2017-11-22: 20 mL

## 2017-11-22 MED ORDER — EPHEDRINE SULFATE-NACL 50-0.9 MG/10ML-% IV SOSY
PREFILLED_SYRINGE | INTRAVENOUS | Status: DC | PRN
  Administered 2017-11-22: 15 mg via INTRAVENOUS

## 2017-11-22 MED ORDER — MIDAZOLAM HCL 2 MG/2ML IJ SOLN
INTRAMUSCULAR | Status: DC | PRN
  Administered 2017-11-22: 2 mg via INTRAVENOUS

## 2017-11-22 MED ORDER — METHOCARBAMOL 500 MG PO TABS
ORAL_TABLET | ORAL | Status: AC
  Filled 2017-11-22: qty 1

## 2017-11-22 MED ORDER — PROPOFOL 10 MG/ML IV BOLUS
INTRAVENOUS | Status: DC | PRN
  Administered 2017-11-22: 200 mg via INTRAVENOUS

## 2017-11-22 MED ORDER — HYDROMORPHONE HCL 1 MG/ML IJ SOLN: 0.2500 mg | INTRAMUSCULAR | Status: DC | PRN

## 2017-11-22 MED ORDER — CEFAZOLIN SODIUM-DEXTROSE 2-4 GM/100ML-% IV SOLN
2.0000 g | INTRAVENOUS | Status: AC
  Administered 2017-11-22 (×2): 2 g via INTRAVENOUS

## 2017-11-22 MED ORDER — PROPOFOL 10 MG/ML IV BOLUS
INTRAVENOUS | Status: AC
  Filled 2017-11-22: qty 20

## 2017-11-22 MED ORDER — LIDOCAINE 2% (20 MG/ML) 5 ML SYRINGE
INTRAMUSCULAR | Status: AC
  Filled 2017-11-22: qty 5

## 2017-11-22 MED ORDER — BUPIVACAINE-EPINEPHRINE (PF) 0.25% -1:200000 IJ SOLN
INTRAMUSCULAR | Status: AC
  Filled 2017-11-22: qty 30

## 2017-11-22 MED ORDER — FENTANYL CITRATE (PF) 250 MCG/5ML IJ SOLN
INTRAMUSCULAR | Status: AC
  Filled 2017-11-22: qty 5

## 2017-11-22 SURGICAL SUPPLY — 69 items
BUR EGG ELITE 4.0 (BURR) IMPLANT
CABLE BIPOLOR RESECTION CORD (MISCELLANEOUS) ×2 IMPLANT
CAGE TLIF NANOLOCK 11 (Cage) ×2 IMPLANT
CANISTER SUCT 3000ML PPV (MISCELLANEOUS) ×2 IMPLANT
CLIP NEUROVISION LG (CLIP) ×2 IMPLANT
CLSR STERI-STRIP ANTIMIC 1/2X4 (GAUZE/BANDAGES/DRESSINGS) ×2 IMPLANT
COVER SURGICAL LIGHT HANDLE (MISCELLANEOUS) ×2 IMPLANT
DRAIN TLS ROUND 10FR (DRAIN) ×2 IMPLANT
DRAPE C-ARM 42X72 X-RAY (DRAPES) ×2 IMPLANT
DRAPE C-ARMOR (DRAPES) ×2 IMPLANT
DRAPE POUCH INSTRU U-SHP 10X18 (DRAPES) ×2 IMPLANT
DRAPE SURG 17X23 STRL (DRAPES) ×2 IMPLANT
DRAPE U-SHAPE 47X51 STRL (DRAPES) ×2 IMPLANT
DRSG OPSITE POSTOP 4X6 (GAUZE/BANDAGES/DRESSINGS) ×2 IMPLANT
DRSG OPSITE POSTOP 4X8 (GAUZE/BANDAGES/DRESSINGS) ×2 IMPLANT
DURAPREP 26ML APPLICATOR (WOUND CARE) ×2 IMPLANT
ELECT BLADE 4.0 EZ CLEAN MEGAD (MISCELLANEOUS) ×2
ELECT BLADE 6.5 EXT (BLADE) IMPLANT
ELECT PENCIL ROCKER SW 15FT (MISCELLANEOUS) ×2 IMPLANT
ELECT REM PT RETURN 9FT ADLT (ELECTROSURGICAL) ×2
ELECTRODE BLDE 4.0 EZ CLN MEGD (MISCELLANEOUS) ×1 IMPLANT
ELECTRODE REM PT RTRN 9FT ADLT (ELECTROSURGICAL) ×1 IMPLANT
GLOVE BIOGEL PI IND STRL 8.5 (GLOVE) ×1 IMPLANT
GLOVE BIOGEL PI INDICATOR 8.5 (GLOVE) ×1
GLOVE SS BIOGEL STRL SZ 8.5 (GLOVE) ×1 IMPLANT
GLOVE SUPERSENSE BIOGEL SZ 8.5 (GLOVE) ×1
GOWN STRL REUS W/ TWL LRG LVL3 (GOWN DISPOSABLE) ×3 IMPLANT
GOWN STRL REUS W/TWL 2XL LVL3 (GOWN DISPOSABLE) ×2 IMPLANT
GOWN STRL REUS W/TWL LRG LVL3 (GOWN DISPOSABLE) ×3
GUIDEWIRE NITINOL BEVEL TIP (WIRE) ×8 IMPLANT
KIT BASIN OR (CUSTOM PROCEDURE TRAY) ×2 IMPLANT
KIT POSITION SURG JACKSON T1 (MISCELLANEOUS) IMPLANT
KIT TURNOVER KIT B (KITS) ×2 IMPLANT
LIGHT SOURCE ANGLE TIP STR 7FT (MISCELLANEOUS) ×2 IMPLANT
NEEDLE 22X1 1/2 (OR ONLY) (NEEDLE) ×2 IMPLANT
NEEDLE I-PASS III (NEEDLE) ×2 IMPLANT
NEEDLE SPNL 18GX3.5 QUINCKE PK (NEEDLE) ×4 IMPLANT
NS IRRIG 1000ML POUR BTL (IV SOLUTION) ×2 IMPLANT
PACK LAMINECTOMY ORTHO (CUSTOM PROCEDURE TRAY) ×2 IMPLANT
PACK UNIVERSAL I (CUSTOM PROCEDURE TRAY) ×2 IMPLANT
PAD ARMBOARD 7.5X6 YLW CONV (MISCELLANEOUS) ×4 IMPLANT
PATTIES SURGICAL .5 X.5 (GAUZE/BANDAGES/DRESSINGS) IMPLANT
PATTIES SURGICAL .5 X1 (DISPOSABLE) ×2 IMPLANT
POSITIONER HEAD PRONE TRACH (MISCELLANEOUS) ×2 IMPLANT
PROBE BALL TIP NVM5 SNG USE (BALLOONS) ×2 IMPLANT
PUTTY DBX 1CC (Putty) ×2 IMPLANT
PUTTY DBX 1CC DEPUY (Putty) ×1 IMPLANT
REDUCTION EXT RELINE MAS MOD (Neuro Prosthesis/Implant) ×4 IMPLANT
ROD RELINE MAS TI 5.5X35MM LRD (Rod) ×2 IMPLANT
ROD RELINE MAS TI LORD 5.5X40 (Rod) ×2 IMPLANT
SCREW LOCK RELINE 5.5 TULIP (Screw) ×8 IMPLANT
SCREW RELINE MAS POLY 6.5X40MM (Screw) ×4 IMPLANT
SCREW SHANK RELINE 6.5X45MM 2C (Screw) ×4 IMPLANT
SHEET CONFORM 45LX20WX5H (Bone Implant) ×2 IMPLANT
SPONGE LAP 4X18 RFD (DISPOSABLE) ×6 IMPLANT
SPONGE SURGIFOAM ABS GEL 100 (HEMOSTASIS) ×2 IMPLANT
SURGIFLO W/THROMBIN 8M KIT (HEMOSTASIS) ×2 IMPLANT
SUT BONE WAX W31G (SUTURE) ×2 IMPLANT
SUT MNCRL AB 3-0 PS2 18 (SUTURE) ×4 IMPLANT
SUT SILK 0 FSL (SUTURE) ×2 IMPLANT
SUT VIC AB 1 CT1 18XCR BRD 8 (SUTURE) ×2 IMPLANT
SUT VIC AB 1 CT1 8-18 (SUTURE) ×2
SUT VIC AB 2-0 CT1 18 (SUTURE) ×4 IMPLANT
SYR BULB IRRIGATION 50ML (SYRINGE) ×2 IMPLANT
SYR CONTROL 10ML LL (SYRINGE) ×2 IMPLANT
TOWEL GREEN STERILE (TOWEL DISPOSABLE) ×2 IMPLANT
TOWEL GREEN STERILE FF (TOWEL DISPOSABLE) ×2 IMPLANT
TRAY FOLEY MTR SLVR 16FR STAT (SET/KITS/TRAYS/PACK) ×2 IMPLANT
YANKAUER SUCT BULB TIP NO VENT (SUCTIONS) ×2 IMPLANT

## 2017-11-22 NOTE — Brief Op Note (Signed)
11/22/2017  8:50 PM  PATIENT:  [name]  67 y.o. 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  PROCEDURE:  Procedure(s) with comments: TRANSFORAMINAL LUMBAR INTERBODY FUSION Lumbar four-five (N/A) - 4.5 hrs  SURGEON:  Surgeon(s) and Role:    Melina Schools, MD - Primary  PHYSICIAN ASSISTANT:   ASSISTANTS: Carmen Mayo    ANESTHESIA:   general  EBL:  300 mL   BLOOD ADMINISTERED:none  DRAINS: (1) TLS Drain(s) to suction in the back   LOCAL MEDICATIONS USED:  MARCAINE     SPECIMEN:  No Specimen  DISPOSITION OF SPECIMEN:  N/A  COUNTS:  YES  TOURNIQUET:  * No tourniquets in log *  DICTATION: .Dragon Dictation  PLAN OF CARE: Admit to inpatient   PATIENT DISPOSITION:  PACU - hemodynamically stable.

## 2017-11-22 NOTE — Anesthesia Procedure Notes (Signed)
Procedure Name: Intubation Date/Time: 11/22/2017 4:47 PM Performed by: Genelle Bal, CRNA Pre-anesthesia Checklist: Patient identified, Emergency Drugs available, Suction available and Patient being monitored Patient Re-evaluated:Patient Re-evaluated prior to induction Oxygen Delivery Method: Circle system utilized Preoxygenation: Pre-oxygenation with 100% oxygen Induction Type: IV induction Ventilation: Mask ventilation without difficulty Laryngoscope Size: Miller and 2 Grade View: Grade I Tube type: Oral Tube size: 7.5 mm Number of attempts: 1 Airway Equipment and Method: Stylet and Oral airway Placement Confirmation: ETT inserted through vocal cords under direct vision,  positive ETCO2 and breath sounds checked- equal and bilateral Secured at: 23 cm Tube secured with: Tape Dental Injury: Teeth and Oropharynx as per pre-operative assessment

## 2017-11-22 NOTE — Progress Notes (Signed)
Patient has requested that information be shared with his wife Manus Gunning) after surgery.   She can be reached at [phone].

## 2017-11-22 NOTE — Discharge Instructions (Signed)
Spinal Fusion, Care After °These instructions give you information about caring for yourself after your procedure. Your doctor may also give you more specific instructions. Call your doctor if you have any problems or questions after your procedure. °Follow these instructions at home: °Medicines °· Take over-the-counter and prescription medicines only as told by your doctor. These include any medicines for pain. °· Do not drive for 24 hours if you received a sedative. °· Do not drive or use heavy machinery while taking prescription pain medicine. °· If you were prescribed an antibiotic medicine, take it as told by your doctor. Do not stop taking the antibiotic even if you start to feel better. °Surgical Cut (Incision) Care °· Follow instructions from your doctor about how to take care of your surgical cut. Make sure you: °? Wash your hands with soap and water before you change your bandage (dressing). If you cannot use soap and water, use hand sanitizer. °? Change your bandage as told by your doctor. °? Leave stitches (sutures), skin glue, or skin tape (adhesive) strips in place. They may need to stay in place for 2 weeks or longer. If tape strips get loose and curl up, you may trim the loose edges. Do not remove tape strips completely unless your doctor says it is okay. °· Keep your surgical cut clean and dry. Do not take baths, swim, or use a hot tub until your doctor says it is okay. °· Check your surgical cut and the area around it every day for: °? Redness. °? Swelling. °? Fluid. °Physical Activity °· Return to your normal activities as told by your doctor. Ask your doctor what activities are safe for you. Rest and protect your back as much as you can. °· Follow instructions from your doctor about how to move. Use good posture to help your spine heal. °· Do not lift anything that is heavier than 8 lb (3.6 kg) or as told by your doctor until he or she says that it is safe. Do not lift anything over your  head. °· Do not twist or bend at the waist until your doctor says it is okay. °· Avoid pushing or pulling motions. °· Do not sit or lie down in the same position for long periods of time. °· Do not start to exercise until your doctor says it is okay. Ask your doctor what kinds of exercise you can do to make your back stronger. °General instructions °· If you were given a brace, use it as told by your doctor. °· Wear compression stockings as told by your doctor. °· Do not use tobacco products. These include cigarettes, chewing tobacco, or e-cigarettes. If you need help quitting, ask your doctor. °· Keep all follow-up visits as told by your doctor. This is important. This includes any visits with your physical therapist, if this applies. °Contact a doctor if: °· Your pain gets worse. °· Your medicine does not help your pain. °· Your legs or feet become painful or swollen. °· Your surgical cut is red, swollen, or painful. °· You have fluid, blood, or pus coming from your surgical cut. °· You feel sick to your stomach (nauseous). °· You throw up (vomit). °· Your have weakness or loss of feeling (numbness) in your legs that is new or getting worse. °· You have a fever. °· You have trouble controlling when you pee (urinate) or poop (have a bowel movement). °Get help right away if: °· Your pain is very bad. °· You have   chest pain. °· You have trouble breathing. °· You start to have a cough. °These symptoms may be an emergency. Do not wait to see if the symptoms will go away. Get medical help right away. Call your local emergency services (911 in the [place] Do not drive yourself to the hospital. °This information is not intended to replace advice given to you by your health care provider. Make sure you discuss any questions you have with your health care provider. °Document Released: 06/10/2010 Document Revised: 10/13/2015 Document Reviewed: 07/30/2014 °Elsevier Interactive Patient Education © 2018 [employer] ° °

## 2017-11-22 NOTE — Anesthesia Postprocedure Evaluation (Signed)
Anesthesia Post Note  Patient: Francisco Cowan  Procedure(s) Performed: TRANSFORAMINAL LUMBAR INTERBODY FUSION Lumbar four-five (N/A Back)     Patient location during evaluation: PACU Anesthesia Type: General Level of consciousness: awake Pain management: pain level controlled Vital Signs Assessment: post-procedure vital signs reviewed and stable Respiratory status: spontaneous breathing Cardiovascular status: stable Postop Assessment: no apparent nausea or vomiting Anesthetic complications: no    Last Vitals:  Vitals:   11/22/17 2149 11/22/17 2204  BP: 119/70 124/69  Pulse: 87 82  Resp: (!) 25 15  Temp:    SpO2: 97% 100%    Last Pain:  Vitals:   11/22/17 2207  TempSrc:   PainSc: 10-Worst pain ever                 Skylin Kennerson

## 2017-11-22 NOTE — Anesthesia Preprocedure Evaluation (Addendum)
Anesthesia Evaluation  Patient identified by MRN, date of birth, ID band Patient awake    Reviewed: Allergy & Precautions, H&P , NPO status , Patient's Chart, lab work & pertinent test results  Airway Mallampati: II  TM Distance: >3 FB Neck ROM: Full    Dental no notable dental hx. (+) Teeth Intact, Dental Advisory Given   Pulmonary neg pulmonary ROS, former smoker,    Pulmonary exam normal breath sounds clear to auscultation       Cardiovascular hypertension, Pt. on medications  Rhythm:Regular Rate:Normal     Neuro/Psych Anxiety Depression negative neurological ROS  negative psych ROS   GI/Hepatic GERD  Medicated and Controlled,(+)     substance abuse  alcohol use,   Endo/Other  negative endocrine ROS  Renal/GU negative Renal ROS  negative genitourinary   Musculoskeletal   Abdominal   Peds  Hematology negative hematology ROS (+)   Anesthesia Other Findings   Reproductive/Obstetrics negative OB ROS                            Anesthesia Physical Anesthesia Plan  ASA: II  Anesthesia Plan: General   Post-op Pain Management:    Induction: Intravenous  PONV Risk Score and Plan: 3 and Ondansetron, Midazolam and Treatment may vary due to age or medical condition  Airway Management Planned: Oral ETT  Additional Equipment:   Intra-op Plan:   Post-operative Plan: Extubation in OR  Informed Consent: I have reviewed the patients History and Physical, chart, labs and discussed the procedure including the risks, benefits and alternatives for the proposed anesthesia with the patient or authorized representative who has indicated his/her understanding and acceptance.   Dental advisory given  Plan Discussed with: CRNA  Anesthesia Plan Comments:        Anesthesia Quick Evaluation

## 2017-11-22 NOTE — Op Note (Signed)
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  Postoperative diagnosis: Same  Operative procedure: Transforaminal lumbar interbody fusion W0-9  Complications: None  First assistant: Ronette Deter, PA  Implants: 1.  Titan Nano lock endoskeleton intervertebral cage.  Size 11 extra-large.   2. Nuvasive MIS screw system.  Left: 6.5 x 45 mm length screws.  Right: 6.5 x 40 mm length screws Graft: Autograft from decompression.  Graft: DBX and comform sheet  Monitoring: No adverse intraoperative SSEPs or EMG activity was noted.  All pedicle screws stimulated with no activity greater than 40 mA  Indications: Is a very pleasant 67 year old gentleman is to have progressive debilitating back buttock and neuropathic left L4-L5 radicular pain.  Attempts at conservative management failed to alleviate his symptoms.  Patient had an anterior listhesis at L4-5 with lateral recess and foraminal stenosis.  As a result of failed conservative management we elected to proceed with surgery.  All appropriate risks benefits and alternatives were discussed with the patient and consent was obtained.  Operative report patient was brought the operating room placed by the operating room table.  After successful induction of general anesthesia and endotracheal ablation teds SCDs and a Foley were inserted.  Patient was turned prone onto the cyst bed frame and all bony problems are well-padded.  Neuro monitoring representative then placed all appropriate monitoring needles in the back was prepped and draped in a standard fashion.  Patient was placed into a flexed kyphotic posture to facilitate the decompression.  Fluoroscopic view was then used to identify the the lateral aspect of the L4 and L5 pedicle.  I marked that these areas infiltrated with quarter percent Marcaine with epinephrine and then made a small incision.  I passed the  Jamshidi needle percutaneously down to the lateral aspect of the pedicle.  I confirmed position and trajectory with fluoroscopy.  I advanced the Jamshidi needle while stimulating it into the L4 pedicle.  As I was nearing the medial border of the pedicle I switched the view to the lateral plane.  I confirmed that I was just beyond the posterior wall the vertebral body indicating had satisfactory position.  I advanced the Jamshidi needle into the vertebral body I then placed the guidepin to cannulate the pedicle.  I repeated this exact same procedure at L5.  Once both the right L4 and L5 pedicles were cannulated I then turned my attention to the left side.  I again marked the surface area that was on the lateral side of the pedicle infiltrated this and then made an incision.  Sharply dissected down to the most tissue to the deep fascia.  I incised the deep fascia and bluntly dissected through the final musculature to palpate the facet.  Using the same technique I used the contralateral side I cannulated the L4 and L5 pedicles.  I then measured and then placed the screws at each of these levels.  Attached to the screws with a retracting blades.  I then assembled my retracting system.  I then directly stimulated both screws and there was no activity at 40 mA.  At this point the posterior lateral aspect of the spine was identified.  There was significant facet disease noted.  Using an osteotome I resected the entire inferior L4 facet.  I then continued my dissection starting laterally and proceeding medially by taking down the pars of L4.  At this point I could visualize the L4  nerve root as well as palpate the inferior aspect of the L4 pedicl I made sure had a complete tenotomy and completely decompress the L4 nerve root.  I then continued inferiorly removing the superior portion of the L5 superior facet so I could fully visualize the posterior lateral aspect of the disc.  I did perform a laminotomy of L for which  allowed me to remove the synovial cyst that was seen on preoperative imaging.  At this point with the cyst completely removed I then palpated the medial aspect of the L5 pedicle I then took down the osteophyte from the medial aspect of the facet so that I had an adequate lateral recess decompression of L5.  At this point I could now freely pass my [facility] elevator superiorly along the medial aspect of the L4 pedicle and inferiorly on the medial aspect of the L5 pedicle I had an adequate lateral recess and foraminal decompression of the L4 and L5 nerve roots.  Retractor was placed medially and I completely exposed the posterior lateral aspect of the L4-5 disc.  An annulotomy was performed and then using pituitary rongeurs curettes and Kerrison rongeurs I resected the bulk of the disc material.  Using end-cutting and sidecutting curettes I made sure I had bleeding subchondral bone.  Once this was completed I irrigated the wound copiously normal saline and then placed the conformer allograft sheath along the anterior annulus and then trialed and elected to use the size 11 extra long cage.  This cage was packed with the autograft that harvested from the decompression along with DBX.  I was able to mallet the implant into the vertebral space.  Once it came to rest just below the posterior margin of the vertebral body I remove the inserting device.  With the implant vertical I then used the bone tamps to advance it into a horizontal position.  The cage came to rest the anterior third of the disc space.  It was well-seated.  With the cage in place I irrigated the wound copiously normal saline and used bipolar electrocautery FloSeal to obtain and maintain hemostasis.  The cage in place I then took down the kyphosis that had built him preoperatively.  Patient was restored down into the 0 flexed posture position on the spine frame.  This allowed restoration of her lumbar lordosis and improved overall sagittal alignment.  I  then placed the polyaxial heads on the screws and remove the retracting device.  I then measured and placed the appropriate size rod and secured it with a locking caps.  On the contralateral side I placed the 2 screws over the guidepin.  The screws were also directly stimulated and there was no adverse activity at 40 mA.  Once both screws were properly seated I placed the rod and locked it in place with the Topamax.  All 4 locking caps were torqued off according manufacturer standards.  At this point final x-rays were taken which demonstrated satisfactory sagittal alignment and positioning of the hardware in both planes.  All wounds were copiously irrigated with normal saline.  A thrombin-soaked Gelfoam patty was placed over the foraminotomy defect I did place a deep drain.  The deep drain was taken out through a separate stab incision.  All wounds were then closed in a layered fashion with interrupted #1 Vicryl suture, 2-0 Vicryl suture, and 3-0 Monocryl.  Steri-Strips and dry dressings were applied and the patient was ultimately extubated transfer the PACU without incident.  The end of  the case all needle sponge counts were correct.  There are no adverse intraoperative events

## 2017-11-22 NOTE — Transfer of Care (Signed)
Immediate Anesthesia Transfer of Care Note  Patient: Francisco Cowan  Procedure(s) Performed: TRANSFORAMINAL LUMBAR INTERBODY FUSION Lumbar four-five (N/A Back)  Patient Location: PACU  Anesthesia Type:General  Level of Consciousness: oriented, sedated, drowsy, patient cooperative and responds to stimulation  Airway & Oxygen Therapy: Patient Spontanous Breathing and Patient connected to nasal cannula oxygen  Post-op Assessment: Report given to RN, Post -op Vital signs reviewed and stable and Patient moving all extremities X 4  Post vital signs: Reviewed and stable  Last Vitals:  Vitals Value Taken Time  BP    Temp    Pulse 93 11/22/2017  9:24 PM  Resp 23 11/22/2017  9:24 PM  SpO2 98 % 11/22/2017  9:24 PM  Vitals shown include unvalidated device data.  Last Pain:  Vitals:   11/22/17 1052  TempSrc:   PainSc: 0-No pain         Complications: No apparent anesthesia complications

## 2017-11-23 ENCOUNTER — Encounter (HOSPITAL_COMMUNITY): Payer: Self-pay | Admitting: Orthopedic Surgery

## 2017-11-23 MED ORDER — CEFAZOLIN SODIUM-DEXTROSE 2-4 GM/100ML-% IV SOLN
2.0000 g | Freq: Three times a day (TID) | INTRAVENOUS | Status: AC
  Administered 2017-11-23 (×2): 2 g via INTRAVENOUS
  Filled 2017-11-23 (×2): qty 100

## 2017-11-23 MED ORDER — PHENOL 1.4 % MT LIQD: 1.0000 | OROMUCOSAL | Status: DC | PRN

## 2017-11-23 MED ORDER — MENTHOL 3 MG MT LOZG: 1.0000 | LOZENGE | OROMUCOSAL | Status: DC | PRN

## 2017-11-23 MED ORDER — POLYETHYLENE GLYCOL 3350 17 G PO PACK: 17.0000 g | PACK | Freq: Every day | ORAL | Status: DC | PRN

## 2017-11-23 MED ORDER — LISINOPRIL 10 MG PO TABS
10.0000 mg | ORAL_TABLET | Freq: Every day | ORAL | Status: DC
  Administered 2017-11-23: 10 mg via ORAL
  Filled 2017-11-23: qty 1

## 2017-11-23 MED ORDER — ONDANSETRON HCL 4 MG/2ML IJ SOLN: 4.0000 mg | Freq: Four times a day (QID) | INTRAMUSCULAR | Status: DC | PRN

## 2017-11-23 MED ORDER — MORPHINE SULFATE (PF) 2 MG/ML IV SOLN: 1.0000 mg | INTRAVENOUS | Status: DC | PRN

## 2017-11-23 MED ORDER — AMLODIPINE BESYLATE 5 MG PO TABS
2.5000 mg | ORAL_TABLET | Freq: Every day | ORAL | Status: DC
  Administered 2017-11-23: 2.5 mg via ORAL
  Filled 2017-11-23: qty 1

## 2017-11-23 MED ORDER — ACETAMINOPHEN 650 MG RE SUPP: 650.0000 mg | RECTAL | Status: DC | PRN

## 2017-11-23 MED ORDER — MAGNESIUM CITRATE PO SOLN
1.0000 | Freq: Once | ORAL | Status: AC | PRN
  Administered 2017-11-23: 1 via ORAL
  Filled 2017-11-23: qty 296

## 2017-11-23 MED ORDER — BUPROPION HCL ER (XL) 300 MG PO TB24
450.0000 mg | ORAL_TABLET | Freq: Every day | ORAL | Status: DC
  Administered 2017-11-23: 450 mg via ORAL
  Filled 2017-11-23: qty 1

## 2017-11-23 MED ORDER — BUPROPION HCL ER (SR) 150 MG PO TB12: 150.0000 mg | ORAL_TABLET | ORAL | Status: DC

## 2017-11-23 MED ORDER — SODIUM CHLORIDE 0.9% FLUSH: 3.0000 mL | INTRAVENOUS | Status: DC | PRN

## 2017-11-23 MED ORDER — SODIUM CHLORIDE 0.9 % IV SOLN: 250.0000 mL | INTRAVENOUS | Status: DC

## 2017-11-23 MED ORDER — DOCUSATE SODIUM 100 MG PO CAPS
100.0000 mg | ORAL_CAPSULE | Freq: Two times a day (BID) | ORAL | Status: DC
  Administered 2017-11-23: 100 mg via ORAL
  Filled 2017-11-23: qty 1

## 2017-11-23 MED ORDER — ONDANSETRON HCL 4 MG PO TABS: 4.0000 mg | ORAL_TABLET | Freq: Four times a day (QID) | ORAL | Status: DC | PRN

## 2017-11-23 MED ORDER — OXYCODONE HCL 5 MG PO TABS: 5.0000 mg | ORAL_TABLET | ORAL | Status: DC | PRN

## 2017-11-23 MED ORDER — LACTATED RINGERS IV SOLN: INTRAVENOUS | Status: DC

## 2017-11-23 MED ORDER — CITALOPRAM HYDROBROMIDE 20 MG PO TABS
40.0000 mg | ORAL_TABLET | Freq: Every day | ORAL | Status: DC
  Administered 2017-11-23: 40 mg via ORAL
  Filled 2017-11-23: qty 2

## 2017-11-23 MED ORDER — FLUTICASONE PROPIONATE 50 MCG/ACT NA SUSP
1.0000 | Freq: Every day | NASAL | Status: DC | PRN
  Filled 2017-11-23: qty 16

## 2017-11-23 MED ORDER — BUPROPION HCL ER (XL) 300 MG PO TB24: 300.0000 mg | ORAL_TABLET | Freq: Every day | ORAL | Status: DC

## 2017-11-23 MED ORDER — SODIUM CHLORIDE 0.9% FLUSH: 3.0000 mL | Freq: Two times a day (BID) | INTRAVENOUS | Status: DC

## 2017-11-23 MED ORDER — TAMSULOSIN HCL 0.4 MG PO CAPS
0.4000 mg | ORAL_CAPSULE | Freq: Every day | ORAL | Status: DC
  Administered 2017-11-23: 0.4 mg via ORAL
  Filled 2017-11-23: qty 1

## 2017-11-23 MED ORDER — ACETAMINOPHEN 325 MG PO TABS
650.0000 mg | ORAL_TABLET | ORAL | Status: DC | PRN
  Administered 2017-11-23: 650 mg via ORAL
  Filled 2017-11-23: qty 2

## 2017-11-23 MED ORDER — PANTOPRAZOLE SODIUM 40 MG PO TBEC
40.0000 mg | DELAYED_RELEASE_TABLET | Freq: Every day | ORAL | Status: DC
  Administered 2017-11-23: 40 mg via ORAL
  Filled 2017-11-23: qty 1

## 2017-11-23 MED ORDER — MONTELUKAST SODIUM 10 MG PO TABS
10.0000 mg | ORAL_TABLET | Freq: Every day | ORAL | Status: DC
  Administered 2017-11-23: 10 mg via ORAL
  Filled 2017-11-23: qty 1

## 2017-11-23 MED FILL — Thrombin (Recombinant) For Soln 20000 Unit: CUTANEOUS | Qty: 1 | Status: AC

## 2017-11-23 NOTE — Evaluation (Signed)
Occupational Therapy Evaluation and Discharge  Patient Details Name: Francisco Cowan MRN: [id number] DOB: 1950/04/30 Today's Date: 11/23/2017    History of Present Illness Pt is a 67 y/o male s/p TLIF L4-5. Pt has a PMH including Alcohol abuse, Anxiety, Depression,GERD, Hypertension, Moderate tetrahydrocannabinol (THC) dependence, Photodermatitis, Spinal stenosis, and Urge incontinence.   Clinical Impression   PTA Pt mod I for ADL and mobility (been using a walking stick recently). Pt is currently supervision for mobility. Able to bring feet cross to knees for LB dressing, and has walk in shower with built in seat for bathing. Pt able to practice with all AE for ADL (grabber/reacher, long handle sponge, long handle shoe horn, sock donner, and toilet aide). Back handout provided and reviewed adls in detail. Pt educated on: clothing between brace, never sleep in brace, set an alarm at night for medication, avoid sitting for long periods of time, correct bed positioning for sleeping, correct sequence for bed mobility, avoiding lifting more than 5 pounds and never wash directly over incision. All education is complete and patient indicates understanding. Thank you for the opportunity to serve this patient.      Follow Up Recommendations  Supervision - Intermittent    Equipment Recommendations  3 in 1 bedside commode    Recommendations for Other Services       Precautions / Restrictions Precautions Precautions: Fall;Back Precaution Booklet Issued: Yes (comment) Precaution Comments: verbally reviewed and provided written handout Required Braces or Orthoses: Spinal Brace Spinal Brace: Lumbar corset;Applied in sitting position Restrictions Weight Bearing Restrictions: No      Mobility Bed Mobility Overal bed mobility: Modified Independent             General bed mobility comments: instructions on log roll technique  Transfers Overall transfer level: Needs assistance Equipment  used: None Transfers: Sit to/from Stand Sit to Stand: Supervision         General transfer comment: increased time and difficulty required for transfer    Balance Overall balance assessment: Mild deficits observed, not formally tested                                         ADL either performed or assessed with clinical judgement   ADL Overall ADL's : Needs assistance/impaired Eating/Feeding: Independent   Grooming: Supervision/safety;Standing Grooming Details (indicate cue type and reason): educated in compensatory strategies (e.g. cup method for oral care) Upper Body Bathing: Supervision/ safety;With adaptive equipment;Sitting Upper Body Bathing Details (indicate cue type and reason): educated in long handle sponge Lower Body Bathing: Supervison/ safety;With adaptive equipment;Sit to/from stand Lower Body Bathing Details (indicate cue type and reason): educated in long handle sponge Upper Body Dressing : Min guard;Sitting Upper Body Dressing Details (indicate cue type and reason): able to don/doff brace Lower Body Dressing: Min guard;With adaptive equipment;With caregiver independent assisting;Sit to/from stand Lower Body Dressing Details (indicate cue type and reason): educated in AE for LB - and Pt will also have wife available at home for assist Toilet Transfer: Supervision/safety;Ambulation   Toileting- Clothing Manipulation and Hygiene: Moderate assistance;Min guard;With adaptive equipment;Cueing for compensatory techniques;Cueing for back precautions;Sit to/from stand Toileting - Clothing Manipulation Details (indicate cue type and reason): Pt educated in use of toilet aide and wrapping technique for toilet paper to prevent twisting Tub/ Shower Transfer: Walk-in shower;Ambulation;Supervision/safety   Functional mobility during ADLs: Supervision/safety General ADL Comments: educated in entire AE  kit (grabber/reacher, toilet aide, sock donner, long handle  shoe horn, long handle sponge)     Vision Baseline Vision/History: Wears glasses Wears Glasses: At all times Patient Visual Report: No change from baseline Vision Assessment?: No apparent visual deficits     Perception     Praxis      Pertinent Vitals/Pain Pain Assessment: 0-10 Pain Score: 4  Faces Pain Scale: Hurts little more Pain Location: surgical site; especially with sitting and standing Pain Descriptors / Indicators: Aching;Discomfort;Operative site guarding Pain Intervention(s): Monitored during session;Repositioned     Hand Dominance Right   Extremity/Trunk Assessment Upper Extremity Assessment Upper Extremity Assessment: Overall WFL for tasks assessed   Lower Extremity Assessment Lower Extremity Assessment: Defer to PT evaluation   Cervical / Trunk Assessment Cervical / Trunk Assessment: Other exceptions Cervical / Trunk Exceptions: s/p spinal sx   Communication Communication Communication: No difficulties   Cognition Arousal/Alertness: Awake/alert Behavior During Therapy: WFL for tasks assessed/performed Overall Cognitive Status: Within Functional Limits for tasks assessed                                     General Comments       Exercises     Shoulder Instructions      Home Living Family/patient expects to be discharged to:: Private residence Living Arrangements: Spouse/significant other Available Help at Discharge: Family Type of Home: House Home Access: Ramped entrance     Home Layout: One level     Bathroom Shower/Tub: Walk-in shower;Tub/shower unit   Bathroom Toilet: Handicapped height Bathroom Accessibility: Yes How Accessible: Accessible via wheelchair Home Equipment: Other (comment);Shower seat - built in;Grab bars - tub/shower;Hand held shower head;Adaptive equipment(walking stick) Adaptive Equipment: Reacher        Prior Functioning/Environment Level of Independence: Independent        Comments: using  walking stick since May        OT Problem List: Decreased range of motion;Decreased activity tolerance;Impaired balance (sitting and/or standing);Decreased safety awareness;Decreased knowledge of use of DME or AE;Decreased knowledge of precautions;Pain      OT Treatment/Interventions:      OT Goals(Current goals can be found in the care plan section) Acute Rehab OT Goals Patient Stated Goal: "be able to walk and get out again" OT Goal Formulation: With patient Time For Goal Achievement: 12/07/17 Potential to Achieve Goals: Good  OT Frequency:     Barriers to D/C:            Co-evaluation              AM-PAC PT "6 Clicks" Daily Activity     Outcome Measure Help from another person eating meals?: None Help from another person taking care of personal grooming?: None Help from another person toileting, which includes using toliet, bedpan, or urinal?: A Little Help from another person bathing (including washing, rinsing, drying)?: A Little Help from another person to put on and taking off regular upper body clothing?: None Help from another person to put on and taking off regular lower body clothing?: None 6 Click Score: 22   End of Session Equipment Utilized During Treatment: Back brace Nurse Communication: Mobility status;Precautions  Activity Tolerance: Patient tolerated treatment well Patient left: in bed;with call bell/phone within reach  OT Visit Diagnosis: Pain;Unsteadiness on feet (R26.81) Pain - part of body: (back)  Time: 9480-1655 OT Time Calculation (min): 22 min Charges:  OT General Charges $OT Visit: 1 Visit OT Evaluation $OT Eval Moderate Complexity: [place] OTR/L Acute Rehabilitation Services Pager: [phone] Office: [place] 11/23/2017, 12:02 PM

## 2017-11-23 NOTE — Progress Notes (Signed)
    Subjective: Procedure(s) (LRB): TRANSFORAMINAL LUMBAR INTERBODY FUSION Lumbar four-five (N/A) 1 Day Post-Op  Patient reports pain as 2 on 0-10 scale.  Reports decreased leg pain reports incisional back pain   Positive void Negative bowel movement Positive flatus Negative chest pain or shortness of breath  Objective: Vital signs in last 24 hours: Temp:  [97.7 F (36.5 C)-98.6 F (37 C)] 98.6 F (37 C) (09/26 0413) Pulse Rate:  [74-91] 87 (09/26 0413) Resp:  [7-25] 18 (09/26 0413) BP: (110-145)/(60-96) 134/78 (09/26 0413) SpO2:  [94 %-100 %] 97 % (09/26 0413) Weight:  [96.2 kg] 96.2 kg (09/25 1044)  Intake/Output from previous day: 09/25 0701 - 09/26 0700 In: 3600 [P.O.:300; I.V.:2200; IV Piggyback:500] Out: 1102 [Urine:1525; Drains:36; Blood:300]  Labs: No results for input(s): WBC, RBC, HCT, PLT in the last 72 hours. No results for input(s): NA, K, CL, CO2, BUN, CREATININE, GLUCOSE, CALCIUM in the last 72 hours. No results for input(s): LABPT, INR in the last 72 hours.  Physical Exam: Neurologically intact ABD soft Intact pulses distally Incision: dressing C/D/I Compartment soft Body mass index is 33.2 kg/m.   Assessment/Plan: Patient stable  xrays n/a Continue mobilization with physical therapy Continue care  Advance diet Up with therapy  Radicular leg pain resolved.   Pleased with surgical decompression and fusion - plan on d/c to home today  Melina Schools, MD [facility] [phone]

## 2017-11-23 NOTE — Progress Notes (Signed)
Pt doing well. Pt given D/C instructions with Rx's, verbal understanding was provided. Pt's incision is clean and dry with no sign of infection. Pt's IV and drain were removed prior to D/C. Pt D/C'd home via wheelchair per MD order. Pt is stable @ D/C and has no other needs at this time. Holli Humbles, RN

## 2017-11-23 NOTE — Evaluation (Signed)
Physical Therapy Evaluation Patient Details Name: Francisco Cowan MRN: [id number] DOB: November 02, 1950 Today's Date: 11/23/2017   History of Present Illness  Pt is a 67 y/o male s/p TLIF L4-5. Pt has a PMH including Alcohol abuse, Anxiety, Depression,GERD, Hypertension, Moderate tetrahydrocannabinol (THC) dependence, Photodermatitis, Spinal stenosis, and Urge incontinence.  Clinical Impression  Patient evaluated by Physical Therapy with no further acute PT needs identified. Lumbar corset adjusted for improved comfort and fit. Patient ambulating 400 feet with supervision; do note mild balance impairments and gait deviations but pt states this is baseline. Encouraged him to use walking stick as needed, especially for outdoor ambulation over unlevel surfaces. All education has been completed and the patient has no further questions. No follow-up Physical Therapy or equipment needs. PT is signing off. Thank you for this referral.      Follow Up Recommendations No PT follow up    Equipment Recommendations  None recommended by PT    Recommendations for Other Services       Precautions / Restrictions Precautions Precautions: Fall;Back Precaution Booklet Issued: Yes (comment) Precaution Comments: verbally reviewed and provided written handout Required Braces or Orthoses: Spinal Brace Spinal Brace: Lumbar corset Restrictions Weight Bearing Restrictions: No      Mobility  Bed Mobility Overal bed mobility: Modified Independent             General bed mobility comments: instructions on log roll technique  Transfers Overall transfer level: Needs assistance Equipment used: None Transfers: Sit to/from Stand Sit to Stand: Supervision         General transfer comment: increased time and difficulty required for transfer  Ambulation/Gait Ambulation/Gait assistance: Supervision Gait Distance (Feet): 400 Feet Assistive device: None Gait Pattern/deviations: Step-through pattern;Wide  base of support Gait velocity: decr Gait velocity interpretation: <1.8 ft/sec, indicate of risk for recurrent falls General Gait Details: patient utilizing a wide BOS and demonstrating mild unsteadiness. required multiple standing rest breaks  Stairs            Wheelchair Mobility    Modified Rankin (Stroke Patients Only)       Balance Overall balance assessment: Mild deficits observed, not formally tested                                           Pertinent Vitals/Pain Pain Assessment: Faces Faces Pain Scale: Hurts little more Pain Location: surgical site; especially with sitting and standing Pain Descriptors / Indicators: Aching;Discomfort;Operative site guarding Pain Intervention(s): Monitored during session    Home Living Family/patient expects to be discharged to:: Private residence Living Arrangements: Spouse/significant other Available Help at Discharge: Family Type of Home: House Home Access: Ramped entrance     Home Layout: One level Home Equipment: Other (comment)(walking stick)      Prior Function Level of Independence: Independent         Comments: using walking stick since May     Hand Dominance        Extremity/Trunk Assessment   Upper Extremity Assessment Upper Extremity Assessment: Defer to OT evaluation    Lower Extremity Assessment Lower Extremity Assessment: Overall WFL for tasks assessed    Cervical / Trunk Assessment Cervical / Trunk Assessment: Other exceptions Cervical / Trunk Exceptions: s/p spinal sx  Communication   Communication: No difficulties  Cognition Arousal/Alertness: Awake/alert Behavior During Therapy: WFL for tasks assessed/performed Overall Cognitive Status: Within Functional Limits for tasks assessed  General Comments      Exercises     Assessment/Plan    PT Assessment Patent does not need any further PT services  PT  Problem List         PT Treatment Interventions      PT Goals (Current goals can be found in the Care Plan section)  Acute Rehab PT Goals Patient Stated Goal: "do all I need to for therapy." PT Goal Formulation: All assessment and education complete, DC therapy    Frequency     Barriers to discharge        Co-evaluation               AM-PAC PT "6 Clicks" Daily Activity  Outcome Measure Difficulty turning over in bed (including adjusting bedclothes, sheets and blankets)?: A Little Difficulty moving from lying on back to sitting on the side of the bed? : A Little Difficulty sitting down on and standing up from a chair with arms (e.g., wheelchair, bedside commode, etc,.)?: A Little Help needed moving to and from a bed to chair (including a wheelchair)?: A Little Help needed walking in hospital room?: A Little Help needed climbing 3-5 steps with a railing? : A Little 6 Click Score: 18    End of Session Equipment Utilized During Treatment: Back brace Activity Tolerance: Patient tolerated treatment well Patient left: in bed;with call bell/phone within reach Nurse Communication: Mobility status PT Visit Diagnosis: Unsteadiness on feet (R26.81);Other abnormalities of gait and mobility (R26.89);Pain;Difficulty in walking, not elsewhere classified (R26.2) Pain - part of body: (back)    Time: 6283-6629 PT Time Calculation (min) (ACUTE ONLY): 21 min   Charges:   PT Evaluation $PT Eval Low Complexity: 1 Low        Ellamae Sia, PT, DPT Acute Rehabilitation Services Pager [phone] Office [phone]   Willy Eddy 11/23/2017, 9:56 AM

## 2017-11-26 NOTE — Discharge Summary (Signed)
Physician Discharge Summary  Patient ID: Francisco Cowan MRN: [id number] DOB/AGE: [birth year] 66 y.o.  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 Abnormal ECG 09/19/2017   NSR. Left axis deviation.   . Alcohol abuse   . Anxiety   . Depression   . ED (erectile dysfunction)   . GERD (gastroesophageal reflux disease)   . History of kidney stones   . Hypertension   . Moderate tetrahydrocannabinol (THC) dependence ([place])   . Photodermatitis   . Spinal stenosis   . Urge incontinence     Surgeries: Procedure(s): TRANSFORAMINAL LUMBAR INTERBODY FUSION Lumbar four-five on 11/22/2017   Consultants (if any):   Discharged Condition: Improved  Hospital Course: Francisco Cowan is an 67 y.o. male who was admitted 11/22/2017 with a diagnosis of L4-5 spondylolisthesis with b/l synovial cysts and went to the operating room on 11/22/2017 and underwent the above named procedures.  Post op day one pt reports low level of pain controlled on oral medication.  Pt reports decreased leg pain.  Pt is voiding w/o difficulty. Pt is evaluated by PT and OT and cleared for DC.   He was given perioperative antibiotics:  Anti-infectives (From admission, onward)   Start     Dose/Rate Route Frequency Ordered Stop   11/23/17 0300  ceFAZolin (ANCEF) IVPB 2g/100 mL premix     2 g 200 mL/hr over 30 Minutes Intravenous Every 8 hours 11/23/17 0006 11/23/17 1108   11/22/17 1043  ceFAZolin (ANCEF) 2-4 GM/100ML-% IVPB    Note to Pharmacy:  Simeon Craft, [place]   : cabinet override      11/22/17 1043 11/22/17 1649   11/22/17 1034  ceFAZolin (ANCEF) IVPB 2g/100 mL premix     2 g 200 mL/hr over 30 Minutes Intravenous 30 min pre-op 11/22/17 1034 11/22/17 2055    .  He was given sequential compression devices, early ambulation, and TED for  DVT prophylaxis.  He benefited maximally from the hospital stay and there were no complications.    Recent vital signs:  Vitals:   11/23/17 0822 11/23/17 1220  BP: 135/80 128/69  Pulse: 83 84  Resp: 18 18  Temp: 98.5 F (36.9 C) 98.7 F (37.1 C)  SpO2: 98% 100%    Recent laboratory studies:  Lab Results  Component Value Date   HGB 15.0 11/13/2017   HGB 14.5 10/11/2017   Lab Results  Component Value Date   WBC 7.4 11/13/2017   PLT 181 11/13/2017   No results found for: INR Lab Results  Component Value Date   NA 139 11/13/2017   K 4.5 11/13/2017   CL 101 11/13/2017   CO2 29 11/13/2017   BUN 9 11/13/2017   CREATININE 1.07 11/13/2017   GLUCOSE 116 (H) 11/13/2017    Discharge Medications:   Allergies as of 11/23/2017      Reactions   Cortisone    Nsaids Other (See Comments)   GI Bleeding      Medication List    STOP taking these medications   traZODone 150 MG tablet Commonly known as:  DESYREL     TAKE these medications   amLODipine 2.5 MG tablet Commonly known as:  NORVASC Take 2.5 mg by mouth daily.   buPROPion 300 MG 24 hr tablet Commonly known as:  WELLBUTRIN XL Take 300 mg by mouth  daily. Taking with Wellbutrin SR 150mg  for 450mg  Total daily.   buPROPion 150 MG 12 hr tablet Commonly known as:  WELLBUTRIN SR Take 150 mg by mouth See admin instructions. Taking with the Wellbutrin 300mg  dose for a total of 450mg  daily   citalopram 40 MG tablet Commonly known as:  CELEXA Take 40 mg by mouth daily.   fluticasone 50 MCG/ACT nasal spray Commonly known as:  FLONASE Place 1 spray into both nostrils as needed for allergies.   lisinopril 10 MG tablet Commonly known as:  PRINIVIL,ZESTRIL Take 10 mg by mouth daily.   LORazepam 1 MG tablet Commonly known as:  ATIVAN Take 1 mg by mouth every 8 (eight) hours as needed for anxiety or sleep.   methocarbamol 500 MG tablet Commonly known as:  ROBAXIN Take 1 tablet (500 mg total) by mouth 3 (three) times  daily.   montelukast 10 MG tablet Commonly known as:  SINGULAIR Take 10 mg by mouth at bedtime.   multivitamin capsule Take 1 capsule by mouth daily.   ondansetron 4 MG disintegrating tablet Commonly known as:  ZOFRAN-ODT Take 1 tablet (4 mg total) by mouth every 8 (eight) hours as needed for nausea or vomiting.   oxyCODONE-acetaminophen 10-325 MG tablet Commonly known as:  PERCOCET Take 1 tablet by mouth every 4 (four) hours as needed for up to 5 days for pain.   pantoprazole 40 MG tablet Commonly known as:  PROTONIX Take 40 mg by mouth daily.   tamsulosin 0.4 MG Caps capsule Commonly known as:  FLOMAX Take 0.4 mg by mouth daily.       Diagnostic Studies: Dg Lumbar Spine 2-3 Views  Result Date: 11/22/2017 CLINICAL DATA:  TLIF of the lumbar spine EXAM: DG C-ARM 61-120 MIN; LUMBAR SPINE - 2-3 VIEW COMPARISON:  None. FINDINGS: 3 minutes 53 seconds of fluoroscopic time was utilized and 3 images intraoperatively of the lumbar spine provided. Posterior lumbar fusion hardware with interbody disc at L4-5 are identified without complicating features. IMPRESSION: Fluoroscopic time during TLIF procedure at L4-5. No immediate intraoperative complications noted. Electronically Signed   By: Ashley Royalty M.D.   On: 11/22/2017 20:48   Dg C-arm 1-60 Min  Result Date: 11/22/2017 CLINICAL DATA:  TLIF of the lumbar spine EXAM: DG C-ARM 61-120 MIN; LUMBAR SPINE - 2-3 VIEW COMPARISON:  None. FINDINGS: 3 minutes 53 seconds of fluoroscopic time was utilized and 3 images intraoperatively of the lumbar spine provided. Posterior lumbar fusion hardware with interbody disc at L4-5 are identified without complicating features. IMPRESSION: Fluoroscopic time during TLIF procedure at L4-5. No immediate intraoperative complications noted. Electronically Signed   By: Ashley Royalty M.D.   On: 11/22/2017 20:48   Dg C-arm 1-60 Min  Result Date: 11/22/2017 CLINICAL DATA:  TLIF of the lumbar spine EXAM: DG C-ARM 61-120  MIN; LUMBAR SPINE - 2-3 VIEW COMPARISON:  None. FINDINGS: 3 minutes 53 seconds of fluoroscopic time was utilized and 3 images intraoperatively of the lumbar spine provided. Posterior lumbar fusion hardware with interbody disc at L4-5 are identified without complicating features. IMPRESSION: Fluoroscopic time during TLIF procedure at L4-5. No immediate intraoperative complications noted. Electronically Signed   By: Ashley Royalty M.D.   On: 11/22/2017 20:48   Dg C-arm 1-60 Min  Result Date: 11/22/2017 CLINICAL DATA:  TLIF of the lumbar spine EXAM: DG C-ARM 61-120 MIN; LUMBAR SPINE - 2-3 VIEW COMPARISON:  None. FINDINGS: 3 minutes 53 seconds of fluoroscopic time was utilized and 3 images intraoperatively of the  lumbar spine provided. Posterior lumbar fusion hardware with interbody disc at L4-5 are identified without complicating features. IMPRESSION: Fluoroscopic time during TLIF procedure at L4-5. No immediate intraoperative complications noted. Electronically Signed   By: Ashley Royalty M.D.   On: 11/22/2017 20:48    Disposition:  Pt will present to clinic in 2 weeks Post op meds provided Discharge Instructions    Incentive spirometry RT   Complete by:  As directed       Follow-up Information    Melina Schools, MD Follow up in 2 week(s).   Specialty:  Orthopedic Surgery Contact information: [address] [place] [address] [phone]            Signed: Valinda Hoar 11/26/2017, 11:43 AM

## 2017-12-14 DIAGNOSIS — L821 Other seborrheic keratosis: Secondary | ICD-10-CM | POA: Diagnosis not present

## 2017-12-14 DIAGNOSIS — D692 Other nonthrombocytopenic purpura: Secondary | ICD-10-CM | POA: Diagnosis not present

## 2017-12-14 DIAGNOSIS — L218 Other seborrheic dermatitis: Secondary | ICD-10-CM | POA: Diagnosis not present

## 2017-12-14 DIAGNOSIS — L2089 Other atopic dermatitis: Secondary | ICD-10-CM | POA: Diagnosis not present

## 2017-12-14 DIAGNOSIS — D225 Melanocytic nevi of trunk: Secondary | ICD-10-CM | POA: Diagnosis not present

## 2017-12-14 DIAGNOSIS — L814 Other melanin hyperpigmentation: Secondary | ICD-10-CM | POA: Diagnosis not present

## 2017-12-18 ENCOUNTER — Other Ambulatory Visit: Payer: Self-pay

## 2017-12-18 ENCOUNTER — Ambulatory Visit (INDEPENDENT_AMBULATORY_CARE_PROVIDER_SITE_OTHER): Payer: Medicare Other

## 2017-12-18 VITALS — BP 142/82 | HR 80 | Ht 67.0 in | Wt 212.5 lb

## 2017-12-18 DIAGNOSIS — Z0000 Encounter for general adult medical examination without abnormal findings: Secondary | ICD-10-CM

## 2017-12-18 DIAGNOSIS — Z23 Encounter for immunization: Secondary | ICD-10-CM | POA: Diagnosis not present

## 2017-12-18 DIAGNOSIS — E669 Obesity, unspecified: Secondary | ICD-10-CM

## 2017-12-18 MED ORDER — ZOSTER VAC RECOMB ADJUVANTED 50 MCG/0.5ML IM SUSR: 0.5000 mL | Freq: Once | INTRAMUSCULAR | 1 refills | Status: AC

## 2017-12-18 NOTE — Progress Notes (Addendum)
Subjective:   Francisco UECKER is a 67 y.o. male who presents for an Initial Medicare Annual Wellness Visit.  Review of Systems  No ROS.  Medicare Wellness Visit. Additional risk factors are reflected in the social history.  Cardiac Risk Factors include: advanced age (>30men, >69 women);hypertension;male gender;obesity (BMI >30kg/m2);family history of premature cardiovascular disease   Sleep patterns: Sleeps in recliner d/t back, 8 hours.  Home Safety/Smoke Alarms: Feels safe in home. Smoke alarms in place.  Living environment; residence and Firearm Safety: Lives with wife in 1 story home, ramp and rail at doors.  Seat Belt Safety/Bike Helmet: Wears seat belt.    Male:   CCS-09/09/2011, [place] normal ([place]). Recall 10 years.     PSA- No results found for: PSA     Objective:    Today's Vitals   12/18/17 1003  BP: (!) 142/82  Pulse: 80  SpO2: 98%  Weight: 212 lb 8 oz (96.4 kg)  Height: 5\' 7"  (1.702 m)  PainSc: 1    Body mass index is 33.28 kg/m.  Advanced Directives 12/18/2017 11/13/2017  Does Patient Have a Medical Advance Directive? Yes Yes  Type of Advance Directive Living will;Healthcare Power of Attorney Living will;Healthcare Power of [place] in Chart? No - copy requested No - copy requested    Current Medications (verified) Outpatient Encounter Medications as of 12/18/2017  Medication Sig  . amLODipine (NORVASC) 2.5 MG tablet Take 2.5 mg by mouth daily.  Marland Kitchen buPROPion (WELLBUTRIN SR) 150 MG 12 hr tablet Take 150 mg by mouth See admin instructions. Taking with the Wellbutrin 300mg  dose for a total of 450mg  daily  . buPROPion (WELLBUTRIN XL) 300 MG 24 hr tablet Take 300 mg by mouth daily. Taking with Wellbutrin SR 150mg  for 450mg  Total daily.  . citalopram (CELEXA) 40 MG tablet Take 40 mg by mouth daily.  . fluticasone (FLONASE) 50 MCG/ACT nasal spray Place 1 spray into both nostrils as needed for allergies.   Marland Kitchen  lisinopril (PRINIVIL,ZESTRIL) 10 MG tablet Take 10 mg by mouth daily.  Marland Kitchen LORazepam (ATIVAN) 1 MG tablet Take 1 mg by mouth every 8 (eight) hours as needed for anxiety or sleep.   . montelukast (SINGULAIR) 10 MG tablet Take 10 mg by mouth at bedtime.  . Multiple Vitamin (MULTIVITAMIN) capsule Take 1 capsule by mouth daily.  . pantoprazole (PROTONIX) 40 MG tablet Take 40 mg by mouth daily.  . tamsulosin (FLOMAX) 0.4 MG CAPS capsule Take 0.4 mg by mouth daily.   . methocarbamol (ROBAXIN) 500 MG tablet Take 1 tablet (500 mg total) by mouth 3 (three) times daily. (Patient not taking: Reported on 12/18/2017)  . ondansetron (ZOFRAN ODT) 4 MG disintegrating tablet Take 1 tablet (4 mg total) by mouth every 8 (eight) hours as needed for nausea or vomiting. (Patient not taking: Reported on 12/18/2017)  . Zoster Vaccine Adjuvanted [facility]) injection Inject 0.5 mLs into the muscle once for 1 dose.   No facility-administered encounter medications on file as of 12/18/2017.     Allergies (verified) Cortisone and Nsaids   History: Past Medical History:  Diagnosis Date  . Abnormal ECG 09/19/2017   NSR. Left axis deviation.   . Alcohol abuse   . Anxiety   . Depression   . ED (erectile dysfunction)   . GERD (gastroesophageal reflux disease)   . History of kidney stones   . Hypertension   . Moderate tetrahydrocannabinol (THC) dependence ([place])   . Photodermatitis   .  Spinal stenosis   . Urge incontinence    Past Surgical History:  Procedure Laterality Date  . CARPAL TUNNEL RELEASE Bilateral 1991  . COLONOSCOPY    . CYST REMOVAL TRUNK N/A 07/2014   back  . ESOPHAGOGASTRODUODENOSCOPY    . ROTATOR CUFF REPAIR Left 2003/2018  . ROTATOR CUFF REPAIR Right 02/10/2014  . Study: Echo  06/27/2016   LV size normal.  LV wall thickness normal.  LVEF 55-60%.  Diastolic filling pattern indicates impaired relaxation.  GLS test 17%.  LA is mildly dilated.  Moderate AV sclerosis without stenosis.  Trace MR.   RVSP 22.25 mmHg p  . TRANSFORAMINAL LUMBAR INTERBODY FUSION (TLIF) WITH PEDICLE SCREW FIXATION 1 LEVEL N/A 11/22/2017   Procedure: TRANSFORAMINAL LUMBAR INTERBODY FUSION Lumbar four-five;  Surgeon: Melina Schools, MD;  Location: [facility];  Service: Orthopedics;  Laterality: N/A;  4.5 hrs  . ULNAR NERVE TRANSPOSITION Left 2018   Family History  Problem Relation Age of Onset  . Bipolar disorder Mother   . Arthritis Mother   . Depression Mother   . Stroke Mother   . Angina Father   . Hypertension Father   . Stroke Father   . Lung cancer Brother   . COPD Brother   . Hearing loss Brother   . Colon cancer Paternal Aunt   . Bone cancer Paternal Uncle    Social History   Socioeconomic History  . Marital status: Married    Spouse name: Not on file  . Number of children: 2  . Years of education: Not on file  . Highest education level: Not on file  Occupational History  . Occupation: Chief Financial Officer  Social Needs  . Financial resource strain: Not hard at all  . Food insecurity:    Worry: Never true    Inability: Never true  . Transportation needs:    Medical: No    Non-medical: No  Tobacco Use  . Smoking status: Former Smoker    Years: 31.00    Last attempt to quit: 2002    Years since quitting: 17.8  . Smokeless tobacco: Never Used  Substance and Sexual Activity  . Alcohol use: Yes    Alcohol/week: 28.0 standard drinks    Types: 14 Cans of beer, 14 Shots of liquor per week  . Drug use: Not Currently    Types: Marijuana, Benzodiazepines  . Sexual activity: Yes    Partners: Female  Lifestyle  . Physical activity:    Days per week: 4 days    Minutes per session: 20 min  . Stress: Not on file  Relationships  . Social connections:    Talks on phone: Three times a week    Gets together: Once a week    Attends religious service: Never    Active member of club or organization: No    Attends meetings of clubs or organizations: Never    Relationship status: Married    Other Topics Concern  . Not on file  Social History Narrative   Marital status/children/pets: married. Lives in [place] 6 months out of the year (usually leaves in November yearly to [place]).   Education/employment: Some college, Chief Financial Officer.    Daily ETOH and THC use ( on records 02/2017).    Safety:      -smoke alarm in the home:Yes     - wears seatbelt: Yes     - Feels safe in their relationships: Yes   Tobacco Counseling Counseling given: Not Answered  Activities of Daily Living In your present state of health, do you have any difficulty performing the following activities: 12/18/2017 11/13/2017  Hearing? N N  Vision? N N  Difficulty concentrating or making decisions? N N  Walking or climbing stairs? N N  Dressing or bathing? N N  Doing errands, shopping? N N  Preparing Food and eating ? N -  Using the Toilet? N -  In the past six months, have you accidently leaked urine? N -  Do you have problems with loss of bowel control? N -  Managing your Medications? N -  Managing your Finances? N -  Housekeeping or managing your Housekeeping? N -  Some recent data might be hidden     Immunizations and Health Maintenance Immunization History  Administered Date(s) Administered  . Influenza, High Dose Seasonal PF 12/18/2017  . Influenza,inj,quad, With Preservative 03/31/2017  . Pneumococcal Conjugate-13 12/18/2017  . Pneumococcal-Unspecified 04/28/2016   There are no preventive care reminders to display for this patient.  Patient Care Team: Ma Hillock, DO as PCP - General (Family Medicine) Melina Schools, MD as Consulting Physician (Orthopedic Surgery) Dione Plover  Indicate any recent [place] you may have received from other than [facility] providers in the past year (date may be approximate).    Assessment:   This is a routine wellness examination for [place].  Hearing/Vision screen Hearing Screening Comments: Able to hear conversational  tones w/o difficulty. No issues reported.   Vision Screening Comments: Last exam < 1 year. Yearly, Dr. Nancy Fetter [facility]). Wears glasses.   Dietary issues and exercise activities discussed: Current Exercise Habits: Home exercise routine, Type of exercise: walking, Time (Minutes): 30, Frequency (Times/Week): 7, Weekly Exercise (Minutes/Week): 210, Exercise limited by: None identified   Diet (meal preparation, eat out, water intake, caffeinated beverages, dairy products, fruits and vegetables): Drinks water, coffee, V8. Avoids juices and sodas.   Typically eats one meal/day, lean meat and vegetables.   Goals    . Patient Stated     Maintain current health.       Depression Screen PHQ 2/9 Scores 12/18/2017 10/11/2017  PHQ - 2 Score 0 0  PHQ- 9 Score 2 0    Fall Risk Fall Risk  12/18/2017 10/11/2017  Falls in the past year? No No    Cognitive Function: MMSE - Mini Mental State Exam 12/18/2017  Orientation to time 5  Orientation to Place 5  Registration 3  Attention/ Calculation 5  Recall 3  Language- name 2 objects 2  Language- repeat 1  Language- follow 3 step command 3  Language- read & follow direction 1  Write a sentence 1  Copy design 1  Total score 30        Screening Tests Health Maintenance  Topic Date Due  . Hepatitis C Screening  12/19/2018 (Originally [birth year])  . PNA vac Low Risk Adult (2 of 2 - PPSV23) 04/28/2021  . COLONOSCOPY  09/08/2021  . TETANUS/TDAP  02/28/2026  . INFLUENZA VACCINE  Completed         Plan:     Shingles vaccine at pharmacy.   Bring a copy of your living will and/or healthcare power of attorney to your next office visit.  Continue doing brain stimulating activities (puzzles, reading, adult coloring books, staying active) to keep memory sharp.   I have personally reviewed and noted the following in the patient's chart:   . Medical and social history . Use of alcohol, tobacco or illicit drugs  . Current  medications and  supplements . Functional ability and status . Nutritional status . Physical activity . Advanced directives . List of other physicians . Hospitalizations, surgeries, and ER visits in previous 12 months . Vitals . Screenings to include cognitive, depression, and falls . Referrals and appointments  In addition, I have reviewed and discussed with patient certain preventive protocols, quality metrics, and best practice recommendations. A written personalized care plan for preventive services as well as general preventive health recommendations were provided to patient.     Gerilyn Nestle, RN   12/18/2017    PCP Notes: -Fusion surgery 10/2017, doing well.  -PHQ9=2 -Leaving for [place] 12/2017, returning 05/2018.  -No F/U with PCP at this time.   Medical screening examination/treatment/procedure(s) were performed by non-physician practitioner and as supervising physician I was immediately available for consultation/collaboration.  I agree with above assessment and plan.  Electronically Signed by: Howard Pouch, DO [place] [facility] [place]

## 2017-12-18 NOTE — Patient Instructions (Addendum)
Shingles vaccine at pharmacy.   Bring a copy of your living will and/or healthcare power of attorney to your next office visit.  Continue doing brain stimulating activities (puzzles, reading, adult coloring books, staying active) to keep memory sharp.    Health Maintenance, Male A healthy lifestyle and preventive care is important for your health and wellness. Ask your health care provider about what schedule of regular examinations is right for you. What should I know about weight and diet? Eat a Healthy Diet  Eat plenty of vegetables, fruits, whole grains, low-fat dairy products, and lean protein.  Do not eat a lot of foods high in solid fats, added sugars, or salt.  Maintain a Healthy Weight Regular exercise can help you achieve or maintain a healthy weight. You should:  Do at least 150 minutes of exercise each week. The exercise should increase your heart rate and make you sweat (moderate-intensity exercise).  Do strength-training exercises at least twice a week.  Watch Your Levels of Cholesterol and Blood Lipids  Have your blood tested for lipids and cholesterol every 5 years starting at 66 years of age. If you are at high risk for heart disease, you should start having your blood tested when you are 66 years old. You may need to have your cholesterol levels checked more often if: ? Your lipid or cholesterol levels are high. ? You are older than 66 years of age. ? You are at high risk for heart disease.  What should I know about cancer screening? Many types of cancers can be detected early and may often be prevented. Lung Cancer  You should be screened every year for lung cancer if: ? You are a current smoker who has smoked for at least 30 years. ? You are a former smoker who has quit within the past 15 years.  Talk to your health care provider about your screening options, when you should start screening, and how often you should be screened.  Colorectal  Cancer  Routine colorectal cancer screening usually begins at 66 years of age and should be repeated every 5-10 years until you are 66 years old. You may need to be screened more often if early forms of precancerous polyps or small growths are found. Your health care provider may recommend screening at an earlier age if you have risk factors for colon cancer.  Your health care provider may recommend using home test kits to check for hidden blood in the stool.  A small camera at the end of a tube can be used to examine your colon (sigmoidoscopy or colonoscopy). This checks for the earliest forms of colorectal cancer.  Prostate and Testicular Cancer  Depending on your age and overall health, your health care provider may do certain tests to screen for prostate and testicular cancer.  Talk to your health care provider about any symptoms or concerns you have about testicular or prostate cancer.  Skin Cancer  Check your skin from head to toe regularly.  Tell your health care provider about any new moles or changes in moles, especially if: ? There is a change in a mole's size, shape, or color. ? You have a mole that is larger than a pencil eraser.  Always use sunscreen. Apply sunscreen liberally and repeat throughout the day.  Protect yourself by wearing long sleeves, pants, a wide-brimmed hat, and sunglasses when outside.  What should I know about heart disease, diabetes, and high blood pressure?  If you are 18-39 years of age,   have your blood pressure checked every 3-5 years. If you are 40 years of age or older, have your blood pressure checked every year. You should have your blood pressure measured twice-once when you are at a hospital or clinic, and once when you are not at a hospital or clinic. Record the average of the two measurements. To check your blood pressure when you are not at a hospital or clinic, you can use: ? An automated blood pressure machine at a pharmacy. ? A home blood  pressure monitor.  Talk to your health care provider about your target blood pressure.  If you are between 45-79 years old, ask your health care provider if you should take aspirin to prevent heart disease.  Have regular diabetes screenings by checking your fasting blood sugar level. ? If you are at a normal weight and have a low risk for diabetes, have this test once every three years after the age of 45. ? If you are overweight and have a high risk for diabetes, consider being tested at a younger age or more often.  A one-time screening for abdominal aortic aneurysm (AAA) by ultrasound is recommended for men aged 65-75 years who are current or former smokers. What should I know about preventing infection? Hepatitis B If you have a higher risk for hepatitis B, you should be screened for this virus. Talk with your health care provider to find out if you are at risk for hepatitis B infection. Hepatitis C Blood testing is recommended for:  Everyone born from 1945 through 1965.  Anyone with known risk factors for hepatitis C.  Sexually Transmitted Diseases (STDs)  You should be screened each year for STDs including gonorrhea and chlamydia if: ? You are sexually active and are younger than 66 years of age. ? You are older than 66 years of age and your health care provider tells you that you are at risk for this type of infection. ? Your sexual activity has changed since you were last screened and you are at an increased risk for chlamydia or gonorrhea. Ask your health care provider if you are at risk.  Talk with your health care provider about whether you are at high risk of being infected with HIV. Your health care provider may recommend a prescription medicine to help prevent HIV infection.  What else can I do?  Schedule regular health, dental, and eye exams.  Stay current with your vaccines (immunizations).  Do not use any tobacco products, such as cigarettes, chewing tobacco, and  e-cigarettes. If you need help quitting, ask your health care provider.  Limit alcohol intake to no more than 2 drinks per day. One drink equals 12 ounces of beer, 5 ounces of wine, or 1 ounces of hard liquor.  Do not use street drugs.  Do not share needles.  Ask your health care provider for help if you need support or information about quitting drugs.  Tell your health care provider if you often feel depressed.  Tell your health care provider if you have ever been abused or do not feel safe at home. This information is not intended to replace advice given to you by your health care provider. Make sure you discuss any questions you have with your health care provider. Document Released: 08/13/2007 Document Revised: 10/14/2015 Document Reviewed: 11/18/2014 Elsevier Interactive Patient Education  2018 Elsevier Inc.  

## 2018-01-02 DIAGNOSIS — Z4789 Encounter for other orthopedic aftercare: Secondary | ICD-10-CM | POA: Diagnosis not present

## 2018-01-02 DIAGNOSIS — Z4889 Encounter for other specified surgical aftercare: Secondary | ICD-10-CM | POA: Diagnosis not present

## 2018-01-23 DIAGNOSIS — M6281 Muscle weakness (generalized): Secondary | ICD-10-CM | POA: Diagnosis not present

## 2018-01-23 DIAGNOSIS — Z4889 Encounter for other specified surgical aftercare: Secondary | ICD-10-CM | POA: Diagnosis not present

## 2018-01-29 DIAGNOSIS — Z4889 Encounter for other specified surgical aftercare: Secondary | ICD-10-CM | POA: Diagnosis not present

## 2018-01-29 DIAGNOSIS — M6281 Muscle weakness (generalized): Secondary | ICD-10-CM | POA: Diagnosis not present

## 2018-02-05 DIAGNOSIS — Z4889 Encounter for other specified surgical aftercare: Secondary | ICD-10-CM | POA: Diagnosis not present

## 2018-02-05 DIAGNOSIS — M6281 Muscle weakness (generalized): Secondary | ICD-10-CM | POA: Diagnosis not present

## 2018-02-06 DIAGNOSIS — R350 Frequency of micturition: Secondary | ICD-10-CM | POA: Diagnosis not present

## 2018-02-06 DIAGNOSIS — H5010 Unspecified exotropia: Secondary | ICD-10-CM | POA: Diagnosis not present

## 2018-02-06 DIAGNOSIS — H2513 Age-related nuclear cataract, bilateral: Secondary | ICD-10-CM | POA: Diagnosis not present

## 2018-02-06 DIAGNOSIS — J309 Allergic rhinitis, unspecified: Secondary | ICD-10-CM | POA: Diagnosis not present

## 2018-02-06 DIAGNOSIS — J329 Chronic sinusitis, unspecified: Secondary | ICD-10-CM | POA: Diagnosis not present

## 2018-02-06 DIAGNOSIS — H35361 Drusen (degenerative) of macula, right eye: Secondary | ICD-10-CM | POA: Diagnosis not present

## 2018-02-13 DIAGNOSIS — Z981 Arthrodesis status: Secondary | ICD-10-CM | POA: Diagnosis not present

## 2018-02-26 DIAGNOSIS — F332 Major depressive disorder, recurrent severe without psychotic features: Secondary | ICD-10-CM | POA: Diagnosis not present

## 2018-02-26 DIAGNOSIS — F411 Generalized anxiety disorder: Secondary | ICD-10-CM | POA: Diagnosis not present

## 2018-03-07 DIAGNOSIS — J329 Chronic sinusitis, unspecified: Secondary | ICD-10-CM | POA: Diagnosis not present

## 2018-03-15 DIAGNOSIS — J329 Chronic sinusitis, unspecified: Secondary | ICD-10-CM | POA: Diagnosis not present

## 2018-03-27 DIAGNOSIS — R350 Frequency of micturition: Secondary | ICD-10-CM | POA: Diagnosis not present

## 2018-03-27 DIAGNOSIS — R311 Benign essential microscopic hematuria: Secondary | ICD-10-CM | POA: Diagnosis not present

## 2018-04-03 DIAGNOSIS — M545 Low back pain: Secondary | ICD-10-CM | POA: Diagnosis not present

## 2018-04-03 DIAGNOSIS — I10 Essential (primary) hypertension: Secondary | ICD-10-CM | POA: Diagnosis not present

## 2018-04-03 DIAGNOSIS — Z1331 Encounter for screening for depression: Secondary | ICD-10-CM | POA: Diagnosis not present

## 2018-04-03 DIAGNOSIS — F339 Major depressive disorder, recurrent, unspecified: Secondary | ICD-10-CM | POA: Diagnosis not present

## 2018-04-05 DIAGNOSIS — M47816 Spondylosis without myelopathy or radiculopathy, lumbar region: Secondary | ICD-10-CM | POA: Diagnosis not present

## 2018-04-16 DIAGNOSIS — J342 Deviated nasal septum: Secondary | ICD-10-CM | POA: Diagnosis not present

## 2018-04-18 DIAGNOSIS — H01002 Unspecified blepharitis right lower eyelid: Secondary | ICD-10-CM | POA: Diagnosis not present

## 2018-04-18 DIAGNOSIS — H01001 Unspecified blepharitis right upper eyelid: Secondary | ICD-10-CM | POA: Diagnosis not present

## 2018-04-18 DIAGNOSIS — H02835 Dermatochalasis of left lower eyelid: Secondary | ICD-10-CM | POA: Diagnosis not present

## 2018-04-18 DIAGNOSIS — H02831 Dermatochalasis of right upper eyelid: Secondary | ICD-10-CM | POA: Diagnosis not present

## 2018-04-18 DIAGNOSIS — H2513 Age-related nuclear cataract, bilateral: Secondary | ICD-10-CM | POA: Diagnosis not present

## 2018-04-18 DIAGNOSIS — H01004 Unspecified blepharitis left upper eyelid: Secondary | ICD-10-CM | POA: Diagnosis not present

## 2018-04-18 DIAGNOSIS — H01005 Unspecified blepharitis left lower eyelid: Secondary | ICD-10-CM | POA: Diagnosis not present

## 2018-04-18 DIAGNOSIS — H02832 Dermatochalasis of right lower eyelid: Secondary | ICD-10-CM | POA: Diagnosis not present

## 2018-04-18 DIAGNOSIS — H02834 Dermatochalasis of left upper eyelid: Secondary | ICD-10-CM | POA: Diagnosis not present

## 2018-05-02 DIAGNOSIS — J320 Chronic maxillary sinusitis: Secondary | ICD-10-CM | POA: Diagnosis not present

## 2018-05-02 DIAGNOSIS — J329 Chronic sinusitis, unspecified: Secondary | ICD-10-CM | POA: Diagnosis not present

## 2018-05-02 DIAGNOSIS — J322 Chronic ethmoidal sinusitis: Secondary | ICD-10-CM | POA: Diagnosis not present

## 2018-05-02 DIAGNOSIS — J342 Deviated nasal septum: Secondary | ICD-10-CM | POA: Diagnosis not present

## 2018-05-02 DIAGNOSIS — J323 Chronic sphenoidal sinusitis: Secondary | ICD-10-CM | POA: Diagnosis not present

## 2018-05-08 DIAGNOSIS — H02831 Dermatochalasis of right upper eyelid: Secondary | ICD-10-CM | POA: Diagnosis not present

## 2018-05-08 DIAGNOSIS — H01002 Unspecified blepharitis right lower eyelid: Secondary | ICD-10-CM | POA: Diagnosis not present

## 2018-05-08 DIAGNOSIS — H02834 Dermatochalasis of left upper eyelid: Secondary | ICD-10-CM | POA: Diagnosis not present

## 2018-05-08 DIAGNOSIS — H02835 Dermatochalasis of left lower eyelid: Secondary | ICD-10-CM | POA: Diagnosis not present

## 2018-05-08 DIAGNOSIS — H01005 Unspecified blepharitis left lower eyelid: Secondary | ICD-10-CM | POA: Diagnosis not present

## 2018-05-08 DIAGNOSIS — H01001 Unspecified blepharitis right upper eyelid: Secondary | ICD-10-CM | POA: Diagnosis not present

## 2018-05-08 DIAGNOSIS — H2513 Age-related nuclear cataract, bilateral: Secondary | ICD-10-CM | POA: Diagnosis not present

## 2018-05-08 DIAGNOSIS — H01004 Unspecified blepharitis left upper eyelid: Secondary | ICD-10-CM | POA: Diagnosis not present

## 2018-05-08 DIAGNOSIS — H02832 Dermatochalasis of right lower eyelid: Secondary | ICD-10-CM | POA: Diagnosis not present

## 2018-05-29 DIAGNOSIS — F332 Major depressive disorder, recurrent severe without psychotic features: Secondary | ICD-10-CM | POA: Diagnosis not present

## 2018-05-29 DIAGNOSIS — F411 Generalized anxiety disorder: Secondary | ICD-10-CM | POA: Diagnosis not present

## 2018-06-07 DIAGNOSIS — Z981 Arthrodesis status: Secondary | ICD-10-CM | POA: Diagnosis not present

## 2018-07-12 DIAGNOSIS — Z5189 Encounter for other specified aftercare: Secondary | ICD-10-CM | POA: Diagnosis not present

## 2018-07-31 DIAGNOSIS — D2272 Melanocytic nevi of left lower limb, including hip: Secondary | ICD-10-CM | POA: Diagnosis not present

## 2018-07-31 DIAGNOSIS — L812 Freckles: Secondary | ICD-10-CM | POA: Diagnosis not present

## 2018-07-31 DIAGNOSIS — L568 Other specified acute skin changes due to ultraviolet radiation: Secondary | ICD-10-CM | POA: Diagnosis not present

## 2018-07-31 DIAGNOSIS — D485 Neoplasm of uncertain behavior of skin: Secondary | ICD-10-CM | POA: Diagnosis not present

## 2018-07-31 DIAGNOSIS — D2261 Melanocytic nevi of right upper limb, including shoulder: Secondary | ICD-10-CM | POA: Diagnosis not present

## 2018-07-31 DIAGNOSIS — D2262 Melanocytic nevi of left upper limb, including shoulder: Secondary | ICD-10-CM | POA: Diagnosis not present

## 2018-07-31 DIAGNOSIS — L821 Other seborrheic keratosis: Secondary | ICD-10-CM | POA: Diagnosis not present

## 2018-07-31 DIAGNOSIS — L579 Skin changes due to chronic exposure to nonionizing radiation, unspecified: Secondary | ICD-10-CM | POA: Diagnosis not present

## 2018-07-31 DIAGNOSIS — D2271 Melanocytic nevi of right lower limb, including hip: Secondary | ICD-10-CM | POA: Diagnosis not present

## 2018-07-31 DIAGNOSIS — L723 Sebaceous cyst: Secondary | ICD-10-CM | POA: Diagnosis not present

## 2018-07-31 DIAGNOSIS — D235 Other benign neoplasm of skin of trunk: Secondary | ICD-10-CM | POA: Diagnosis not present

## 2018-07-31 DIAGNOSIS — D1801 Hemangioma of skin and subcutaneous tissue: Secondary | ICD-10-CM | POA: Diagnosis not present

## 2018-10-08 ENCOUNTER — Encounter: Payer: Self-pay | Admitting: Family Medicine

## 2018-10-08 ENCOUNTER — Ambulatory Visit (INDEPENDENT_AMBULATORY_CARE_PROVIDER_SITE_OTHER): Payer: Medicare Other | Admitting: Family Medicine

## 2018-10-08 ENCOUNTER — Other Ambulatory Visit: Payer: Self-pay

## 2018-10-08 VITALS — BP 141/86 | HR 76 | Temp 97.9°F | Resp 17 | Ht 67.0 in | Wt 218.0 lb

## 2018-10-08 DIAGNOSIS — L600 Ingrowing nail: Secondary | ICD-10-CM | POA: Diagnosis not present

## 2018-10-08 DIAGNOSIS — M109 Gout, unspecified: Secondary | ICD-10-CM

## 2018-10-08 DIAGNOSIS — E669 Obesity, unspecified: Secondary | ICD-10-CM | POA: Insufficient documentation

## 2018-10-08 MED ORDER — PREDNISONE 20 MG PO TABS: 20.0000 mg | ORAL_TABLET | Freq: Every day | ORAL | 0 refills | Status: DC

## 2018-10-08 NOTE — Patient Instructions (Signed)
I have called in the prednisone taper for your gout. Follow a low purine eating plan to help avoid future gout flares.  I referred you to podiatry for your toenail.    Low-Purine Eating Plan A low-purine eating plan involves making food choices to limit your intake of purine. Purine is a kind of uric acid. Too much uric acid in your blood can cause certain conditions, such as gout and kidney stones. Eating a low-purine diet can help control these conditions. What are tips for following this plan? Reading food labels   Avoid foods with saturated or Trans fat.  Check the ingredient list of grains-based foods, such as bread and cereal, to make sure that they contain whole grains.  Check the ingredient list of sauces or soups to make sure they do not contain meat or fish.  When choosing soft drinks, check the ingredient list to make sure they do not contain high-fructose corn syrup. Shopping  Buy plenty of fresh fruits and vegetables.  Avoid buying canned or fresh fish.  Buy dairy products labeled as low-fat or nonfat.  Avoid buying premade or processed foods. These foods are often high in fat, salt (sodium), and added sugar. Cooking  Use olive oil instead of butter when cooking. Oils like olive oil, canola oil, and sunflower oil contain healthy fats. Meal planning  Learn which foods do or do not affect you. If you find out that a food tends to cause your gout symptoms to flare up, avoid eating that food. You can enjoy foods that do not cause problems. If you have any questions about a food item, talk with your dietitian or health care provider.  Limit foods high in fat, especially saturated fat. Fat makes it harder for your body to get rid of uric acid.  Choose foods that are lower in fat and are lean sources of protein. General guidelines  Limit alcohol intake to no more than 1 drink a day for nonpregnant women and 2 drinks a day for men. One drink equals 12 oz of beer, 5 oz of  wine, or 1 oz of hard liquor. Alcohol can affect the way your body gets rid of uric acid.  Drink plenty of water to keep your urine clear or pale yellow. Fluids can help remove uric acid from your body.  If directed by your health care provider, take a vitamin C supplement.  Work with your health care provider and dietitian to develop a plan to achieve or maintain a healthy weight. Losing weight can help reduce uric acid in your blood. What foods are recommended? The items listed may not be a complete list. Talk with your dietitian about what dietary choices are best for you. Foods low in purines Foods low in purines do not need to be limited. These include:  All fruits.  All low-purine vegetables, pickles, and olives.  Breads, pasta, rice, cornbread, and popcorn. Cake and other baked goods.  All dairy foods.  Eggs, nuts, and nut butters.  Spices and condiments, such as salt, herbs, and vinegar.  Plant oils, butter, and margarine.  Water, sugar-free soft drinks, tea, coffee, and cocoa.  Vegetable-based soups, broths, sauces, and gravies. Foods moderate in purines Foods moderate in purines should be limited to the amounts listed.   cup of asparagus, cauliflower, spinach, mushrooms, or green peas, each day.  2/3 cup uncooked oatmeal, each day.   cup dry wheat bran or wheat germ, each day.  2-3 ounces of meat or poultry, each  day.  4-6 ounces of shellfish, such as crab, lobster, oysters, or shrimp, each day.  1 cup cooked beans, peas, or lentils, each day.  Soup, broths, or bouillon made from meat or fish. Limit these foods as much as possible. What foods are not recommended? The items listed may not be a complete list. Talk with your dietitian about what dietary choices are best for you. Limit your intake of foods high in purines, including:  Beer and other alcohol.  Meat-based gravy or sauce.  Canned or fresh fish, such as: ? Anchovies, sardines, herring, and  tuna. ? Mussels and scallops. ? Codfish, trout, and haddock.  Francisco Cowan.  Organ meats, such as: ? Liver or kidney. ? Tripe. ? Sweetbreads (thymus gland or pancreas).  Wild Clinical biochemist.  Yeast or yeast extract supplements.  Drinks sweetened with high-fructose corn syrup. Summary  Eating a low-purine diet can help control conditions caused by too much uric acid in the body, such as gout or kidney stones.  Choose low-purine foods, limit alcohol, and limit foods high in fat.  You will learn over time which foods do or do not affect you. If you find out that a food tends to cause your gout symptoms to flare up, avoid eating that food. This information is not intended to replace advice given to you by your health care provider. Make sure you discuss any questions you have with your health care provider. Document Released: 06/11/2010 Document Revised: 01/27/2017 Document Reviewed: 03/30/2016 Elsevier Patient Education  2020 [employer].

## 2018-10-08 NOTE — Progress Notes (Signed)
Francisco Cowan , [birth year], 68 y.o., male MRN: [id number] Patient Care Team    Relationship Specialty Notifications Start End  Ma Hillock, DO PCP - General Family Medicine  10/11/17   Melina Schools, MD Consulting Physician Orthopedic Surgery  10/12/17   Dione Plover    12/18/17    Comment: PCP in [place]    Chief Complaint  Patient presents with  . Foot Pain    left foot. swelling. pain. x3 weeks. able to walk fine. would like medication to help.      Subjective: Pt presents for an OV with complaints of left foot pain of 3 weeks duration.  He reports a h/o gout that can occur in either foot or hands in the past.He use to be on allopurinol, but developed a rash which they were uncertain if from allopurinol so it was dc'd. He never tried it again. He knows the diet changes he has to watch. He is starting to do so now. He is allergic to NSAIDS and cortisone injections (steroid orally is ok). He has decreased his beer, meat consumption, increased his fluids and started cherry tart extract.  He denies arthritis or injury prior to onset.   He also complains of a toenail issue. His left 2nd toenail has become deformed and grown into his toe, which is causing him pain. It is also discolored.   Depression screen [facility] 2/9 10/08/2018 10/08/2018 12/18/2017 10/11/2017  Decreased Interest 0 0 0 0  Down, Depressed, Hopeless 0 0 0 0  PHQ - 2 Score 0 0 0 0  Altered sleeping 0 - 0 0  Tired, decreased energy 0 - 1 0  Change in appetite 0 - 0 0  Feeling bad or failure about yourself  0 - 0 0  Trouble concentrating 0 - 1 0  Moving slowly or fidgety/restless 0 - 0 0  Suicidal thoughts 0 - 0 0  PHQ-9 Score 0 - 2 0  Difficult doing work/chores Not difficult at all - Not difficult at all Not difficult at all    Allergies  Allergen Reactions  . Cortisone   . Nsaids Other (See Comments)    GI Bleeding   Social History   Social History Narrative   Marital status/children/pets: married.  Lives in [place] 6 months out of the year (usually leaves in November yearly to [place]).   Education/employment: Some college, Chief Financial Officer.    Daily ETOH and THC use ( on records 02/2017).    Safety:      -smoke alarm in the home:Yes     - wears seatbelt: Yes     - Feels safe in their relationships: Yes   Past Medical History:  Diagnosis Date  . Abnormal ECG 09/19/2017   NSR. Left axis deviation.   . Alcohol abuse   . Anxiety   . Depression   . ED (erectile dysfunction)   . GERD (gastroesophageal reflux disease)   . History of kidney stones   . Hypertension   . Moderate tetrahydrocannabinol (THC) dependence ([place])   . Photodermatitis   . Spinal stenosis   . Urge incontinence    Past Surgical History:  Procedure Laterality Date  . CARPAL TUNNEL RELEASE Bilateral 1991  . COLONOSCOPY    . CYST REMOVAL TRUNK N/A 07/2014   back  . ESOPHAGOGASTRODUODENOSCOPY    . ROTATOR CUFF REPAIR Left 2003/2018  . ROTATOR CUFF REPAIR Right 02/10/2014  . Study: Echo  06/27/2016   LV size normal.  LV wall thickness normal.  LVEF 55-60%.  Diastolic filling pattern indicates impaired relaxation.  GLS test 17%.  LA is mildly dilated.  Moderate AV sclerosis without stenosis.  Trace MR.  RVSP 22.25 mmHg p  . TRANSFORAMINAL LUMBAR INTERBODY FUSION (TLIF) WITH PEDICLE SCREW FIXATION 1 LEVEL N/A 11/22/2017   Procedure: TRANSFORAMINAL LUMBAR INTERBODY FUSION Lumbar four-five;  Surgeon: Melina Schools, MD;  Location: [facility];  Service: Orthopedics;  Laterality: N/A;  4.5 hrs  . ULNAR NERVE TRANSPOSITION Left 2018   Family History  Problem Relation Age of Onset  . Bipolar disorder Mother   . Arthritis Mother   . Depression Mother   . Stroke Mother   . Angina Father   . Hypertension Father   . Stroke Father   . Lung cancer Brother   . COPD Brother   . Hearing loss Brother   . Colon cancer Paternal Aunt   . Bone cancer Paternal Uncle    Allergies as of 10/08/2018      Reactions    Cortisone    Nsaids Other (See Comments)   GI Bleeding      Medication List       Accurate as of October 08, 2018 10:38 AM. If you have any questions, ask your nurse or doctor.        amLODipine 2.5 MG tablet Commonly known as: NORVASC Take 2.5 mg by mouth daily.   buPROPion 300 MG 24 hr tablet Commonly known as: WELLBUTRIN XL Take 300 mg by mouth daily. Taking with Wellbutrin SR 150mg  for 450mg  Total daily.   buPROPion 150 MG 12 hr tablet Commonly known as: WELLBUTRIN SR Take 150 mg by mouth See admin instructions. Taking with the Wellbutrin 300mg  dose for a total of 450mg  daily   citalopram 40 MG tablet Commonly known as: CELEXA Take 40 mg by mouth daily.   fluticasone 50 MCG/ACT nasal spray Commonly known as: FLONASE Place 1 spray into both nostrils as needed for allergies.   lisinopril 10 MG tablet Commonly known as: ZESTRIL Take 10 mg by mouth daily.   LORazepam 1 MG tablet Commonly known as: ATIVAN Take 1 mg by mouth every 8 (eight) hours as needed for anxiety or sleep.   methocarbamol 500 MG tablet Commonly known as: Robaxin Take 1 tablet (500 mg total) by mouth 3 (three) times daily.   montelukast 10 MG tablet Commonly known as: SINGULAIR Take 10 mg by mouth at bedtime.   multivitamin capsule Take 1 capsule by mouth daily.   ondansetron 4 MG disintegrating tablet Commonly known as: Zofran ODT Take 1 tablet (4 mg total) by mouth every 8 (eight) hours as needed for nausea or vomiting.   pantoprazole 40 MG tablet Commonly known as: PROTONIX Take 40 mg by mouth daily.   tamsulosin 0.4 MG Caps capsule Commonly known as: FLOMAX Take 0.4 mg by mouth daily.       All past medical history, surgical history, allergies, family history, immunizations andmedications were updated in the EMR today and reviewed under the history and medication portions of their EMR.     ROS: Negative, with the exception of above mentioned in HPI   Objective:  BP (!)  141/86 (BP Location: Right Arm, Patient Position: Sitting, Cuff Size: Large)   Pulse 76   Temp 97.9 F (36.6 C) (Temporal)   Resp 17   Ht 5\' 7"  (1.702 m)   Wt 218 lb (98.9 kg)   SpO2 100%   BMI 34.14 kg/m  Body  mass index is 34.14 kg/m. Gen: Afebrile. No acute distress. Nontoxic in appearance, well developed, well nourished.  MSK: mild left foot/ankle swelling lateral aspect. TTP over this area. Deformed 2nd ingrown toenail.  Neuro: Normal gait. PERLA. EOMi. Alert. Oriented x3   No exam data present No results found. No results found for this or any previous visit (from the past 24 hour(s)).  Assessment/Plan: Francisco Cowan is a 68 y.o. male present for OV for  Ingrown nail of second toe of left foot/  - Ambulatory referral to Podiatry for nail deformity.  Acute gout of left ankle, unspecified cause Exam consistent with gout flare. He is familiar with them and has about 2 a year. -He is trying to make dietary changes.  - low purine diet.  - may attempt to try allopurinol in the future if flares occur more frequently.  - f/u PRN    Reviewed expectations re: course of current medical issues.  Discussed self-management of symptoms.  Outlined signs and symptoms indicating need for more acute intervention.  Patient verbalized understanding and all questions were answered.  Patient received an After-Visit Summary.    No orders of the defined types were placed in this encounter.  > 15 minutes spent with patient, > 50% of that time face to face     Note is dictated utilizing voice recognition software. Although note has been proof read prior to signing, occasional typographical errors still can be missed. If any questions arise, please do not hesitate to call for verification.   electronically signed by:  Howard Pouch, DO  [place]

## 2018-10-11 ENCOUNTER — Ambulatory Visit (INDEPENDENT_AMBULATORY_CARE_PROVIDER_SITE_OTHER): Payer: Medicare Other | Admitting: Podiatry

## 2018-10-11 ENCOUNTER — Encounter: Payer: Self-pay | Admitting: Podiatry

## 2018-10-11 ENCOUNTER — Other Ambulatory Visit: Payer: Self-pay

## 2018-10-11 VITALS — BP 148/80 | HR 80 | Temp 98.0°F | Resp 16

## 2018-10-11 DIAGNOSIS — M2042 Other hammer toe(s) (acquired), left foot: Secondary | ICD-10-CM | POA: Diagnosis not present

## 2018-10-11 DIAGNOSIS — L600 Ingrowing nail: Secondary | ICD-10-CM | POA: Diagnosis not present

## 2018-10-11 MED ORDER — NEOMYCIN-POLYMYXIN-HC 3.5-10000-1 OT SOLN: OTIC | 1 refills | Status: DC

## 2018-10-11 NOTE — Progress Notes (Signed)
   Subjective:    Patient ID: Francisco Cowan, male    DOB: [birth year], 68 y.o.   MRN: [id number]  HPI    Review of Systems  All other systems reviewed and are negative.      Objective:   Physical Exam        Assessment & Plan:

## 2018-10-11 NOTE — Progress Notes (Signed)
Subjective:   Patient ID: Francisco Cowan, male   DOB: 68 y.o.   MRN: [id number]   HPI Patient presents stating he is got a painful second nail left that makes it hard for him to wear shoe gear and has been ongoing and he cannot take care of it and it is increasingly hard to cut and also just has generalized foot pain if he is on his feet for too long..  Patient does not smoke likes to be active and is complaining of pain in the fifth digit left foot   Review of Systems  All other systems reviewed and are negative.       Objective:  Physical Exam Vitals signs and nursing note reviewed.  Constitutional:      Appearance: He is well-developed.  Pulmonary:     Effort: Pulmonary effort is normal.  Musculoskeletal: Normal range of motion.  Skin:    General: Skin is warm.  Neurological:     Mental Status: He is alert.     Neurovascular status intact muscle strength adequate range of motion within normal limits with patient found to have an incurvated thickened painful second nail left that is dystrophic and hard to cut.  Also was noted to have some swelling of the fifth digit left foot with irritation of the bone structure that is localized in nature and has good digital perfusion well oriented x3     Assessment:  Damage second nail left chronic in nature with pain and digital deformity fifth left     Plan:  H&P reviewed condition and recommended permanent nail removal explained procedure risk to patient and he wants surgery.  As far as the digit goes I do not recommend treatment but if it continues to persist will need to consider arthroplasty.  Today I went ahead and allow patient to read sign consent form and then I anesthetized the left second digit 60 mg like Marcaine mixture sterile prep applied to the toe and using sterile instrumentation I remove the nail exposed matrix and applied phenol for applications 30 seconds followed by alcohol lavage sterile dressing.  Gave  instructions on soaks and reappoint for [place] to reevaluate and encouraged to call with questions and wrote prescription for drops to use postoperatively

## 2018-10-11 NOTE — Patient Instructions (Signed)

## 2018-11-29 DIAGNOSIS — Z23 Encounter for immunization: Secondary | ICD-10-CM | POA: Diagnosis not present

## 2019-01-01 DIAGNOSIS — F331 Major depressive disorder, recurrent, moderate: Secondary | ICD-10-CM | POA: Diagnosis not present

## 2019-01-01 DIAGNOSIS — F332 Major depressive disorder, recurrent severe without psychotic features: Secondary | ICD-10-CM | POA: Diagnosis not present

## 2019-01-01 DIAGNOSIS — F411 Generalized anxiety disorder: Secondary | ICD-10-CM | POA: Diagnosis not present

## 2019-01-10 DIAGNOSIS — I10 Essential (primary) hypertension: Secondary | ICD-10-CM | POA: Diagnosis not present

## 2019-01-15 DIAGNOSIS — H01002 Unspecified blepharitis right lower eyelid: Secondary | ICD-10-CM | POA: Diagnosis not present

## 2019-01-15 DIAGNOSIS — H01001 Unspecified blepharitis right upper eyelid: Secondary | ICD-10-CM | POA: Diagnosis not present

## 2019-01-15 DIAGNOSIS — H01005 Unspecified blepharitis left lower eyelid: Secondary | ICD-10-CM | POA: Diagnosis not present

## 2019-01-15 DIAGNOSIS — H5010 Unspecified exotropia: Secondary | ICD-10-CM | POA: Diagnosis not present

## 2019-01-15 DIAGNOSIS — H01004 Unspecified blepharitis left upper eyelid: Secondary | ICD-10-CM | POA: Diagnosis not present

## 2019-01-15 DIAGNOSIS — H2513 Age-related nuclear cataract, bilateral: Secondary | ICD-10-CM | POA: Diagnosis not present

## 2019-03-06 DIAGNOSIS — I10 Essential (primary) hypertension: Secondary | ICD-10-CM | POA: Diagnosis not present

## 2019-03-06 DIAGNOSIS — Z136 Encounter for screening for cardiovascular disorders: Secondary | ICD-10-CM | POA: Diagnosis not present

## 2019-03-06 DIAGNOSIS — Z1159 Encounter for screening for other viral diseases: Secondary | ICD-10-CM | POA: Diagnosis not present

## 2019-03-06 DIAGNOSIS — E669 Obesity, unspecified: Secondary | ICD-10-CM | POA: Diagnosis not present

## 2019-03-06 DIAGNOSIS — R972 Elevated prostate specific antigen [PSA]: Secondary | ICD-10-CM | POA: Diagnosis not present

## 2019-03-06 DIAGNOSIS — Z6835 Body mass index (BMI) 35.0-35.9, adult: Secondary | ICD-10-CM | POA: Diagnosis not present

## 2019-03-06 DIAGNOSIS — R7303 Prediabetes: Secondary | ICD-10-CM | POA: Diagnosis not present

## 2019-03-08 DIAGNOSIS — R194 Change in bowel habit: Secondary | ICD-10-CM | POA: Diagnosis not present

## 2019-03-08 DIAGNOSIS — K2100 Gastro-esophageal reflux disease with esophagitis, without bleeding: Secondary | ICD-10-CM | POA: Diagnosis not present

## 2019-03-14 DIAGNOSIS — R194 Change in bowel habit: Secondary | ICD-10-CM | POA: Diagnosis not present

## 2019-03-14 DIAGNOSIS — D124 Benign neoplasm of descending colon: Secondary | ICD-10-CM | POA: Diagnosis not present

## 2019-03-14 DIAGNOSIS — Z20822 Contact with and (suspected) exposure to covid-19: Secondary | ICD-10-CM | POA: Diagnosis not present

## 2019-03-14 DIAGNOSIS — K648 Other hemorrhoids: Secondary | ICD-10-CM | POA: Diagnosis not present

## 2019-03-14 DIAGNOSIS — K5730 Diverticulosis of large intestine without perforation or abscess without bleeding: Secondary | ICD-10-CM | POA: Diagnosis not present

## 2019-03-18 DIAGNOSIS — R194 Change in bowel habit: Secondary | ICD-10-CM | POA: Diagnosis not present

## 2019-03-18 DIAGNOSIS — D124 Benign neoplasm of descending colon: Secondary | ICD-10-CM | POA: Diagnosis not present

## 2019-03-18 DIAGNOSIS — Z79899 Other long term (current) drug therapy: Secondary | ICD-10-CM | POA: Diagnosis not present

## 2019-03-18 DIAGNOSIS — K5730 Diverticulosis of large intestine without perforation or abscess without bleeding: Secondary | ICD-10-CM | POA: Diagnosis not present

## 2019-03-18 DIAGNOSIS — R195 Other fecal abnormalities: Secondary | ICD-10-CM | POA: Diagnosis not present

## 2019-03-18 DIAGNOSIS — Z20822 Contact with and (suspected) exposure to covid-19: Secondary | ICD-10-CM | POA: Diagnosis not present

## 2019-03-18 DIAGNOSIS — K648 Other hemorrhoids: Secondary | ICD-10-CM | POA: Diagnosis not present

## 2019-03-21 IMAGING — RF DG C-ARM 61-120 MIN
1 series · 3 of 3 positions shown · non-contrast
Comparison: None.

CLINICAL DATA: TLIF of the lumbar spine

EXAM:
DG C-ARM 61-120 MIN; LUMBAR SPINE - 2-3 VIEW

[Series 1: run · 3 of 3 slices shown]
[im 1/3]
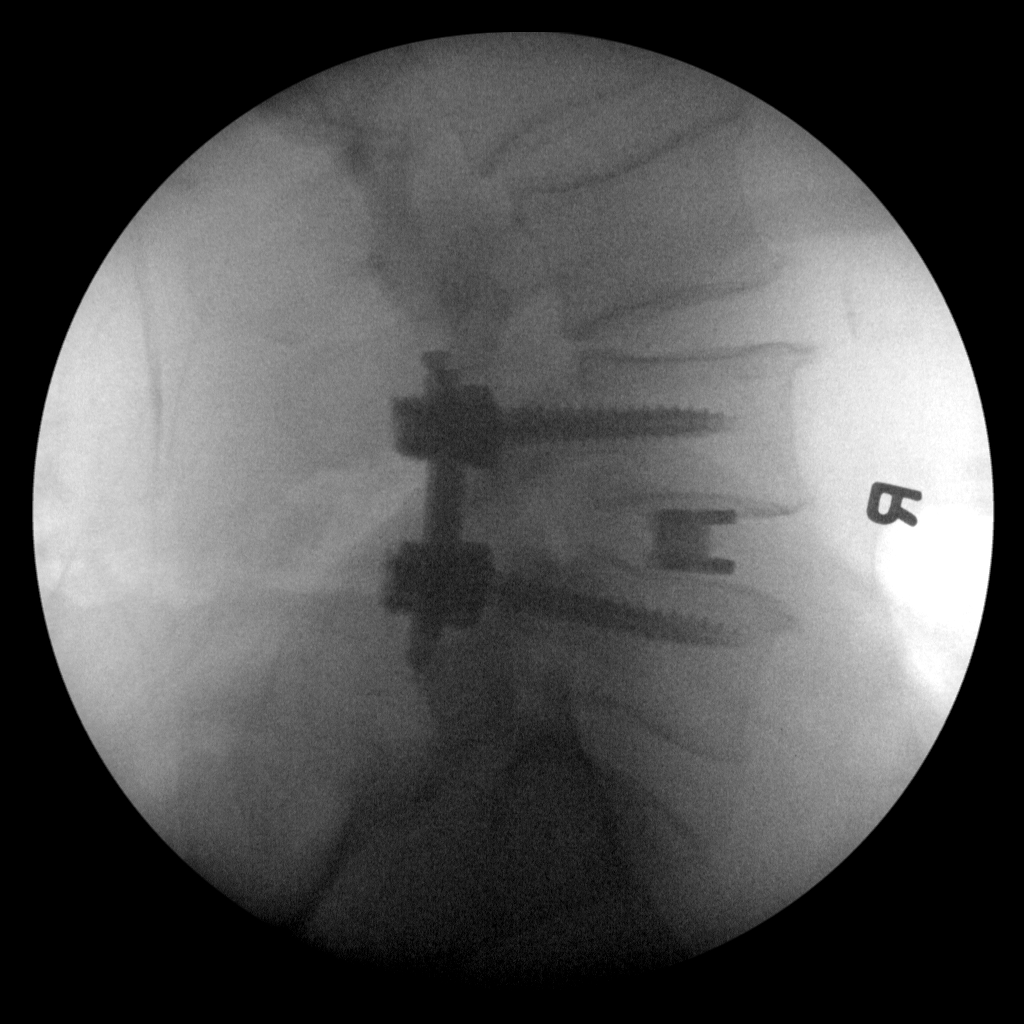
[im 2/3]
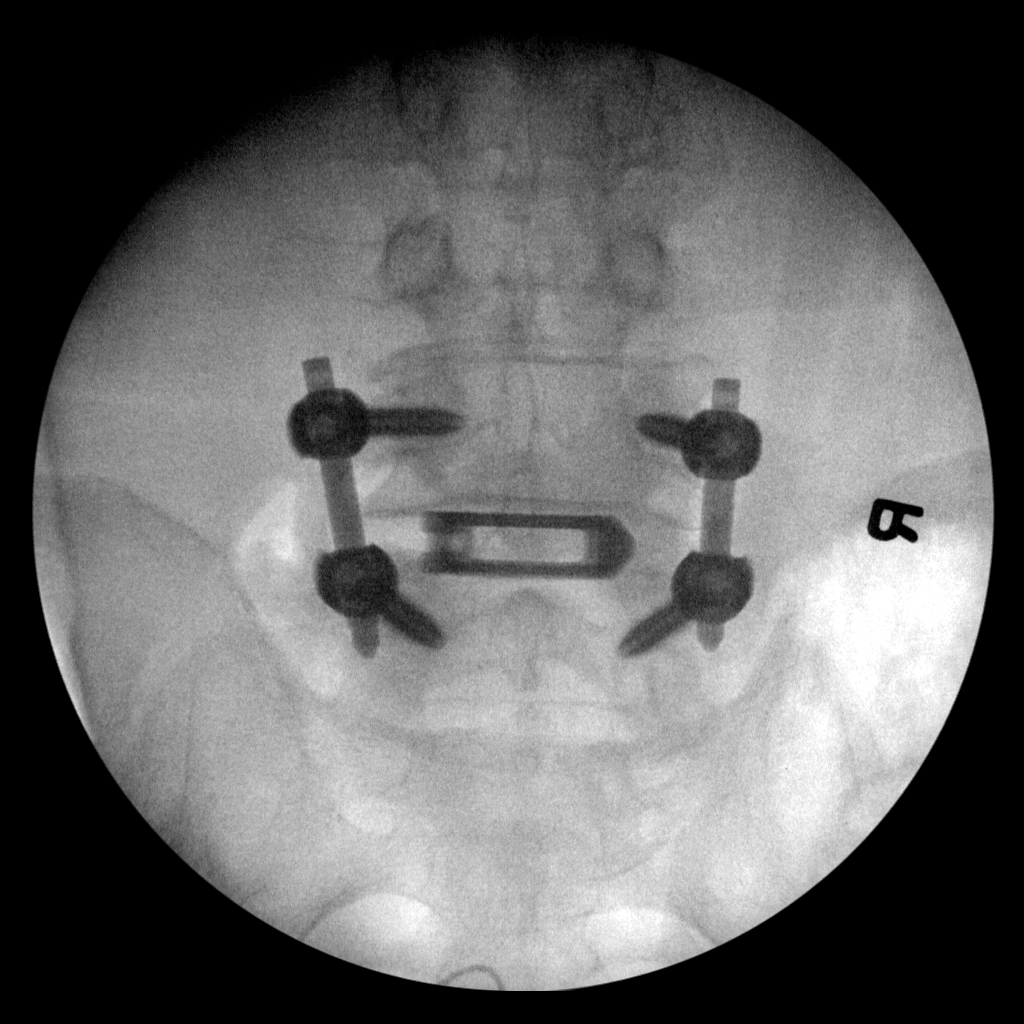
[im 3/3]
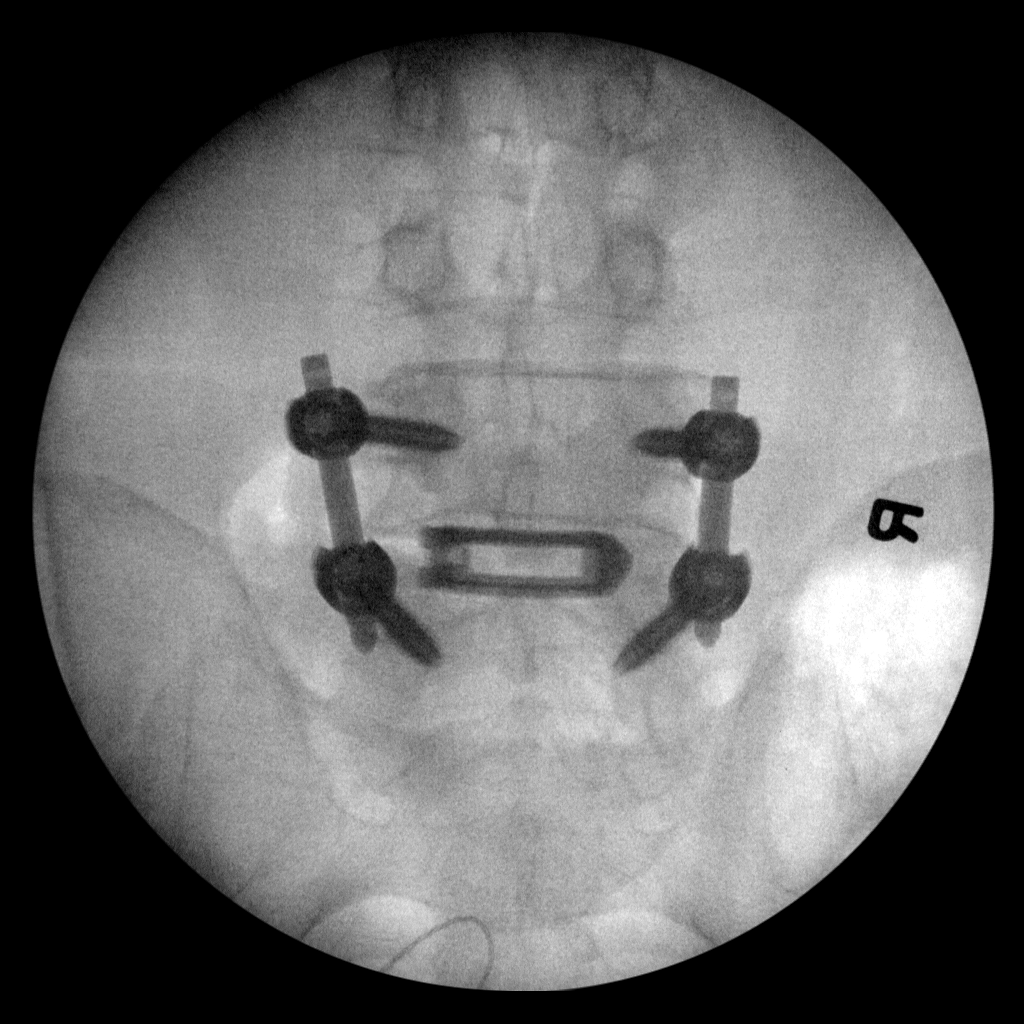

[3 of 3 positions shown; findings below may reference images not displayed]

FINDINGS: 3 minutes 53 seconds of fluoroscopic time was utilized and 3 images
intraoperatively of the lumbar spine provided. Posterior lumbar
fusion hardware with interbody disc at L4-5 are identified without
complicating features.
IMPRESSION: Fluoroscopic time during TLIF procedure at L4-5. No immediate
intraoperative complications noted.

## 2019-03-27 DIAGNOSIS — R3121 Asymptomatic microscopic hematuria: Secondary | ICD-10-CM | POA: Diagnosis not present

## 2019-03-27 DIAGNOSIS — N5201 Erectile dysfunction due to arterial insufficiency: Secondary | ICD-10-CM | POA: Diagnosis not present

## 2019-03-27 DIAGNOSIS — N200 Calculus of kidney: Secondary | ICD-10-CM | POA: Diagnosis not present

## 2019-03-27 DIAGNOSIS — R350 Frequency of micturition: Secondary | ICD-10-CM | POA: Diagnosis not present

## 2019-04-04 DIAGNOSIS — F332 Major depressive disorder, recurrent severe without psychotic features: Secondary | ICD-10-CM | POA: Diagnosis not present

## 2019-04-04 DIAGNOSIS — F411 Generalized anxiety disorder: Secondary | ICD-10-CM | POA: Diagnosis not present

## 2019-04-04 DIAGNOSIS — F331 Major depressive disorder, recurrent, moderate: Secondary | ICD-10-CM | POA: Diagnosis not present

## 2019-04-30 DIAGNOSIS — D485 Neoplasm of uncertain behavior of skin: Secondary | ICD-10-CM | POA: Diagnosis not present

## 2019-04-30 DIAGNOSIS — D2261 Melanocytic nevi of right upper limb, including shoulder: Secondary | ICD-10-CM | POA: Diagnosis not present

## 2019-04-30 DIAGNOSIS — D2272 Melanocytic nevi of left lower limb, including hip: Secondary | ICD-10-CM | POA: Diagnosis not present

## 2019-04-30 DIAGNOSIS — D225 Melanocytic nevi of trunk: Secondary | ICD-10-CM | POA: Diagnosis not present

## 2019-04-30 DIAGNOSIS — D2262 Melanocytic nevi of left upper limb, including shoulder: Secondary | ICD-10-CM | POA: Diagnosis not present

## 2019-04-30 DIAGNOSIS — D234 Other benign neoplasm of skin of scalp and neck: Secondary | ICD-10-CM | POA: Diagnosis not present

## 2019-04-30 DIAGNOSIS — L568 Other specified acute skin changes due to ultraviolet radiation: Secondary | ICD-10-CM | POA: Diagnosis not present

## 2019-04-30 DIAGNOSIS — B353 Tinea pedis: Secondary | ICD-10-CM | POA: Diagnosis not present

## 2019-04-30 DIAGNOSIS — D2271 Melanocytic nevi of right lower limb, including hip: Secondary | ICD-10-CM | POA: Diagnosis not present

## 2019-04-30 DIAGNOSIS — L812 Freckles: Secondary | ICD-10-CM | POA: Diagnosis not present

## 2019-04-30 DIAGNOSIS — L919 Hypertrophic disorder of the skin, unspecified: Secondary | ICD-10-CM | POA: Diagnosis not present

## 2019-04-30 DIAGNOSIS — L821 Other seborrheic keratosis: Secondary | ICD-10-CM | POA: Diagnosis not present

## 2019-06-11 ENCOUNTER — Telehealth: Payer: Self-pay

## 2019-06-11 NOTE — Telephone Encounter (Signed)
Patient called in stating that he thinks his gout is messing with him again. He states that his left foot is swallowing right now. And wants to know what to do and if there is any other medication he can take    Please call and advise

## 2019-06-11 NOTE — Telephone Encounter (Signed)
Pt was called and VM was left letting pt know appt would need to be made (could be virtual) to get a medication sent to pharmacy for Gout flare. Pt advised Dr Raoul Pitch would need to review symptoms and makes sure that is the proper dx and tx.

## 2019-06-12 ENCOUNTER — Telehealth (INDEPENDENT_AMBULATORY_CARE_PROVIDER_SITE_OTHER): Payer: Medicare Other | Admitting: Family Medicine

## 2019-06-12 ENCOUNTER — Encounter: Payer: Self-pay | Admitting: Family Medicine

## 2019-06-12 VITALS — Ht 67.0 in

## 2019-06-12 DIAGNOSIS — M109 Gout, unspecified: Secondary | ICD-10-CM

## 2019-06-12 MED ORDER — PREDNISONE 20 MG PO TABS: ORAL_TABLET | ORAL | 0 refills | Status: DC

## 2019-06-12 NOTE — Progress Notes (Signed)
VIRTUAL VISIT VIA VIDEO> switched to telephone only.   I connected with Francisco Cowan on 06/12/19 at  1:00 PM EDT by elemedicine application and verified that I am speaking with the correct person using two identifiers. Location patient: Home Location provider: [facility], Office Persons participating in the virtual visit: Patient, Dr. Raoul Pitch and R.Baker, LPN  I discussed the limitations of evaluation and management by telemedicine and the availability of in person appointments. The patient expressed understanding and agreed to proceed.  Interactive audio and video telecommunications were attempted between this provider and patient, however failed, due to patient having technical difficulties OR patient did not have access to video capability. We continued and completed visit with audio only.    SUBJECTIVE Chief Complaint  Patient presents with  . Gout    Started Monday in left foot and ankle. Painful, red, and swollen. Has had low grade fever.     HPI: Francisco Cowan is a 69 y.o.  He states it started 2-3 days ago with redness, swelling and pain. He wonders if stress is a factor. It seems to be consistent variable. He has had similar presentation in the same location last year in August. That particular time was treated with prednisone taper. He is unable to tolerate NSAIDS. He states he recalls great improvement shortly after starting the prednisone last time.   ROS: See pertinent positives and negatives per HPI.  Patient Active Problem List   Diagnosis Date Noted  . Obesity (BMI 30-39.9) 10/08/2018  . Acute gout of left ankle 10/08/2018  . Ingrown nail of second toe of left foot 10/08/2018  . S/P lumbar fusion 11/22/2017  . Allergy 10/12/2017  . GERD (gastroesophageal reflux disease) 10/12/2017  . Spinal stenosis 10/12/2017  . Depression, major, single episode, mild ([place]) 10/12/2017  . GAD (generalized anxiety disorder) 10/12/2017  . Elevated glucose  10/11/2017  . Essential hypertension 10/11/2017  . Degeneration of lumbar intervertebral disc 08/01/2017  . Degenerative lumbar spinal stenosis 08/01/2017  . Degenerative spondylolisthesis 08/01/2017    Social History   Tobacco Use  . Smoking status: Former Smoker    Years: 31.00    Quit date: 2002    Years since quitting: 19.2  . Smokeless tobacco: Never Used  Substance Use Topics  . Alcohol use: Yes    Alcohol/week: 28.0 standard drinks    Types: 14 Cans of beer, 14 Shots of liquor per week    Current Outpatient Medications:  .  amLODipine (NORVASC) 2.5 MG tablet, Take 2.5 mg by mouth daily., Disp: , Rfl:  .  buPROPion (WELLBUTRIN SR) 150 MG 12 hr tablet, Take 150 mg by mouth See admin instructions. Taking with the Wellbutrin 300mg  dose for a total of 450mg  daily, Disp: , Rfl:  .  buPROPion (WELLBUTRIN XL) 300 MG 24 hr tablet, Take 300 mg by mouth daily. Taking with Wellbutrin SR 150mg  for 450mg  Total daily., Disp: , Rfl:  .  citalopram (CELEXA) 40 MG tablet, Take 40 mg by mouth daily., Disp: , Rfl:  .  fluticasone (FLONASE) 50 MCG/ACT nasal spray, Place 1 spray into both nostrils as needed for allergies. , Disp: , Rfl:  .  lisinopril (PRINIVIL,ZESTRIL) 10 MG tablet, Take 10 mg by mouth daily., Disp: , Rfl:  .  LORazepam (ATIVAN) 1 MG tablet, Take 1 mg by mouth every 8 (eight) hours as needed for anxiety or sleep. , Disp: , Rfl:  .  Multiple Vitamin (MULTIVITAMIN) capsule, Take 1 capsule by mouth  daily., Disp: , Rfl:  .  pantoprazole (PROTONIX) 40 MG tablet, Take 40 mg by mouth daily., Disp: , Rfl:  .  tamsulosin (FLOMAX) 0.4 MG CAPS capsule, Take 0.4 mg by mouth daily. , Disp: , Rfl:  .  montelukast (SINGULAIR) 10 MG tablet, Take 10 mg by mouth at bedtime., Disp: , Rfl:  .  predniSONE (DELTASONE) 20 MG tablet, 60 mg x3d, 40 mg x3d, 20 mg x2d, 10 mg x2d, Disp: 18 tablet, Rfl: 0  Allergies  Allergen Reactions  . Cortisone     Pt stated, "I am only allergic to cortisone  injections"  . Nsaids Other (See Comments)    GI Bleeding    OBJECTIVE: Ht 5\' 7"  (1.702 m)   BMI 34.14 kg/m  Gen: No acute distress. Neuro:Alert. Oriented x3  Psych: Normal affect and demeanor. Normal speech. Normal thought content and judgment.  ASSESSMENT AND PLAN: Francisco Cowan is a 69 y.o. male present for  Acute gout of left ankle, unspecified cause History consistent with acute gout flare.  treat with prednisone taper.  Low purine diet.  F/u PRN  Howard Pouch, DO 06/12/2019   Return if symptoms worsen or fail to improve.  No orders of the defined types were placed in this encounter.  Meds ordered this encounter  Medications  . predniSONE (DELTASONE) 20 MG tablet    Sig: 60 mg x3d, 40 mg x3d, 20 mg x2d, 10 mg x2d    Dispense:  18 tablet    Refill:  0   Referral Orders  No referral(s) requested today    15 minutes spent with patient today.

## 2019-06-20 DIAGNOSIS — F411 Generalized anxiety disorder: Secondary | ICD-10-CM | POA: Diagnosis not present

## 2019-06-20 DIAGNOSIS — F331 Major depressive disorder, recurrent, moderate: Secondary | ICD-10-CM | POA: Diagnosis not present

## 2019-06-20 DIAGNOSIS — F4312 Post-traumatic stress disorder, chronic: Secondary | ICD-10-CM | POA: Diagnosis not present

## 2019-06-20 DIAGNOSIS — F5101 Primary insomnia: Secondary | ICD-10-CM | POA: Diagnosis not present

## 2019-07-26 ENCOUNTER — Telehealth: Payer: Self-pay

## 2019-07-26 ENCOUNTER — Telehealth (INDEPENDENT_AMBULATORY_CARE_PROVIDER_SITE_OTHER): Payer: Medicare Other | Admitting: Family Medicine

## 2019-07-26 ENCOUNTER — Encounter: Payer: Self-pay | Admitting: Family Medicine

## 2019-07-26 ENCOUNTER — Other Ambulatory Visit: Payer: Self-pay

## 2019-07-26 DIAGNOSIS — M109 Gout, unspecified: Secondary | ICD-10-CM

## 2019-07-26 MED ORDER — PREDNISONE 20 MG PO TABS: ORAL_TABLET | ORAL | 0 refills | Status: DC

## 2019-07-26 NOTE — Progress Notes (Signed)
I have discussed the procedure for the virtual visit with the patient who has given consent to proceed with assessment and treatment.   Araya Roel L Lanyiah Brix, CMA     

## 2019-07-26 NOTE — Telephone Encounter (Signed)
Patient called into after hours services letting them know that he was having a gout flair up in his right thumb. It is barley usable. Patient is currently in [place]. Would a VV be ok to schedule ??   Please call and advise

## 2019-07-26 NOTE — Progress Notes (Signed)
Virtual Visit via Video   I connected with patient on 07/26/19 at 11:30 AM EDT by a video enabled telemedicine application and verified that I am speaking with the correct person using two identifiers.  Location patient: Home Location provider: [facility], Office Persons participating in the virtual visit: Patient, Provider, [place] (Jess B)  I discussed the limitations of evaluation and management by telemedicine and the availability of in person appointments. The patient expressed understanding and agreed to proceed.  Interactive audio and video telecommunications were attempted between this provider and patient, however failed, due to patient having technical difficulties OR patient did not have access to video capability.  We continued and completed visit with audio only.   Subjective:   HPI:   Gout- 'I have another gout attack'.  sxs started on Monday in R thumb.  Initially started in IP joint and has become progressively more red and swollen.  Now in MCP joint and spreading across hand.  Pt feels most attacks are 'triggered by stress'.  'I avoid all the things i'm supposed to avoid'.  Hx of bleeding issues w/ NSAIDs.  Unable to tolerate colchicine.  Has had improvement w/ Prednisone in the past.    ROS:   See pertinent positives and negatives per HPI.  Patient Active Problem List   Diagnosis Date Noted  . Obesity (BMI 30-39.9) 10/08/2018  . Acute gout of left ankle 10/08/2018  . Ingrown nail of second toe of left foot 10/08/2018  . S/P lumbar fusion 11/22/2017  . Allergy 10/12/2017  . GERD (gastroesophageal reflux disease) 10/12/2017  . Spinal stenosis 10/12/2017  . Depression, major, single episode, mild ([place]) 10/12/2017  . GAD (generalized anxiety disorder) 10/12/2017  . Elevated glucose 10/11/2017  . Essential hypertension 10/11/2017  . Degeneration of lumbar intervertebral disc 08/01/2017  . Degenerative lumbar spinal stenosis 08/01/2017  . Degenerative  spondylolisthesis 08/01/2017    Social History   Tobacco Use  . Smoking status: Former Smoker    Years: 31.00    Quit date: 2002    Years since quitting: 19.4  . Smokeless tobacco: Never Used  Substance Use Topics  . Alcohol use: Yes    Alcohol/week: 28.0 standard drinks    Types: 14 Cans of beer, 14 Shots of liquor per week    Current Outpatient Medications:  .  amLODipine (NORVASC) 2.5 MG tablet, Take 2.5 mg by mouth daily., Disp: , Rfl:  .  buPROPion (WELLBUTRIN SR) 150 MG 12 hr tablet, Take 150 mg by mouth See admin instructions. Taking with the Wellbutrin 300mg  dose for a total of 450mg  daily, Disp: , Rfl:  .  buPROPion (WELLBUTRIN XL) 300 MG 24 hr tablet, Take 300 mg by mouth daily. Taking with Wellbutrin SR 150mg  for 450mg  Total daily., Disp: , Rfl:  .  citalopram (CELEXA) 40 MG tablet, Take 40 mg by mouth daily., Disp: , Rfl:  .  fluticasone (FLONASE) 50 MCG/ACT nasal spray, Place 1 spray into both nostrils as needed for allergies. , Disp: , Rfl:  .  lisinopril (PRINIVIL,ZESTRIL) 10 MG tablet, Take 10 mg by mouth daily., Disp: , Rfl:  .  LORazepam (ATIVAN) 1 MG tablet, Take 1 mg by mouth every 8 (eight) hours as needed for anxiety or sleep. , Disp: , Rfl:  .  montelukast (SINGULAIR) 10 MG tablet, Take 10 mg by mouth at bedtime., Disp: , Rfl:  .  Multiple Vitamin (MULTIVITAMIN) capsule, Take 1 capsule by mouth daily., Disp: , Rfl:  .  pantoprazole (  PROTONIX) 40 MG tablet, Take 40 mg by mouth daily., Disp: , Rfl:  .  tamsulosin (FLOMAX) 0.4 MG CAPS capsule, Take 0.4 mg by mouth daily. , Disp: , Rfl:   Allergies  Allergen Reactions  . Cortisone     Pt stated, "I am only allergic to cortisone injections"  . Nsaids Other (See Comments)    GI Bleeding    Objective:   There were no vitals taken for this visit.  Pt is able to speak clearly, coherently without shortness of breath or increased work of breathing.  Thought process is linear.  Mood is appropriate.   Assessment  and Plan:   Gout- new to provider, recurrent issue for pt.  sxs started earlier this week and are progressing.  Pt is intolerant to colchicine and NSAIDs so will refill his previous Prednisone taper.  Reviewed low purine diet.  Pt expressed understanding and is in agreement w/ plan.    Annye Asa, MD 07/26/2019    Time spent with the patient: 7 minutes, of which >50% was spent in obtaining information about symptoms, reviewing previous labs, evaluations, and treatments, counseling about condition (please see the discussed topics above), and developing a plan to further investigate it; had a number of questions which I addressed.

## 2019-07-26 NOTE — Telephone Encounter (Signed)
Dr Anitra Lauth will be out of the office- He can go to urgent care or be placed another Providers schedule

## 2019-07-26 NOTE — Telephone Encounter (Signed)
ERROR

## 2019-08-02 DIAGNOSIS — F4312 Post-traumatic stress disorder, chronic: Secondary | ICD-10-CM | POA: Diagnosis not present

## 2019-08-02 DIAGNOSIS — F5101 Primary insomnia: Secondary | ICD-10-CM | POA: Diagnosis not present

## 2019-08-02 DIAGNOSIS — F332 Major depressive disorder, recurrent severe without psychotic features: Secondary | ICD-10-CM | POA: Diagnosis not present

## 2019-08-02 DIAGNOSIS — F331 Major depressive disorder, recurrent, moderate: Secondary | ICD-10-CM | POA: Diagnosis not present

## 2019-08-02 DIAGNOSIS — F411 Generalized anxiety disorder: Secondary | ICD-10-CM | POA: Diagnosis not present

## 2019-08-09 ENCOUNTER — Ambulatory Visit (INDEPENDENT_AMBULATORY_CARE_PROVIDER_SITE_OTHER): Payer: Medicare Other | Admitting: Family Medicine

## 2019-08-09 ENCOUNTER — Encounter: Payer: Self-pay | Admitting: Family Medicine

## 2019-08-09 ENCOUNTER — Other Ambulatory Visit: Payer: Self-pay

## 2019-08-09 VITALS — BP 138/82 | HR 96 | Temp 98.4°F | Resp 18 | Ht 67.0 in | Wt 217.2 lb

## 2019-08-09 DIAGNOSIS — M1A9XX0 Chronic gout, unspecified, without tophus (tophi): Secondary | ICD-10-CM | POA: Diagnosis not present

## 2019-08-09 MED ORDER — ALLOPURINOL 100 MG PO TABS: 100.0000 mg | ORAL_TABLET | Freq: Every day | ORAL | 0 refills | Status: DC

## 2019-08-09 NOTE — Progress Notes (Signed)
SUBJECTIVE Chief Complaint  Patient presents with  . Gout    Pt would like to start daily med for gout flares.    HPI: Francisco Cowan is a 69 y.o. patient presents today to discuss his gout.  He is having more frequent gout flares and would like to restart allopurinol.  He states he was on this medication at one time.  He believes he ended up needing to take allopurinol 300 mg daily. Prior note: He states it started 2-3 days ago with redness, swelling and pain. He wonders if stress is a factor. It seems to be consistent variable. He has had similar presentation in the same location last year in August. That particular time was treated with prednisone taper. He is unable to tolerate NSAIDS. He states he recalls great improvement shortly after starting the prednisone last time.   ROS: See pertinent positives and negatives per HPI.  Patient Active Problem List   Diagnosis Date Noted  . Obesity (BMI 30-39.9) 10/08/2018  . Acute gout of left ankle 10/08/2018  . Ingrown nail of second toe of left foot 10/08/2018  . S/P lumbar fusion 11/22/2017  . Allergy 10/12/2017  . GERD (gastroesophageal reflux disease) 10/12/2017  . Spinal stenosis 10/12/2017  . Depression, major, single episode, mild ([place]) 10/12/2017  . GAD (generalized anxiety disorder) 10/12/2017  . Elevated glucose 10/11/2017  . Essential hypertension 10/11/2017  . Degeneration of lumbar intervertebral disc 08/01/2017  . Degenerative lumbar spinal stenosis 08/01/2017  . Degenerative spondylolisthesis 08/01/2017    Social History   Tobacco Use  . Smoking status: Former Smoker    Years: 31.00    Quit date: 2002    Years since quitting: 19.4  . Smokeless tobacco: Never Used  Substance Use Topics  . Alcohol use: Yes    Alcohol/week: 28.0 standard drinks    Types: 14 Cans of beer, 14 Shots of liquor per week    Current Outpatient Medications:  .  amLODipine (NORVASC) 2.5 MG tablet, Take 2.5 mg by mouth daily.,  Disp: , Rfl:  .  buPROPion (WELLBUTRIN SR) 150 MG 12 hr tablet, Take 150 mg by mouth See admin instructions. Taking with the Wellbutrin 300mg  dose for a total of 450mg  daily, Disp: , Rfl:  .  buPROPion (WELLBUTRIN XL) 300 MG 24 hr tablet, Take 300 mg by mouth daily. Taking with Wellbutrin SR 150mg  for 450mg  Total daily., Disp: , Rfl:  .  fluticasone (FLONASE) 50 MCG/ACT nasal spray, Place 1 spray into both nostrils as needed for allergies. , Disp: , Rfl:  .  lisinopril (PRINIVIL,ZESTRIL) 10 MG tablet, Take 10 mg by mouth daily., Disp: , Rfl:  .  LORazepam (ATIVAN) 1 MG tablet, Take 1 mg by mouth every 8 (eight) hours as needed for anxiety or sleep. , Disp: , Rfl:  .  montelukast (SINGULAIR) 10 MG tablet, Take 10 mg by mouth at bedtime., Disp: , Rfl:  .  Multiple Vitamin (MULTIVITAMIN) capsule, Take 1 capsule by mouth daily., Disp: , Rfl:  .  pantoprazole (PROTONIX) 40 MG tablet, Take 40 mg by mouth daily., Disp: , Rfl:  .  sertraline (ZOLOFT) 50 MG tablet, Take 1 tablet by mouth daily., Disp: , Rfl:  .  tamsulosin (FLOMAX) 0.4 MG CAPS capsule, Take 0.4 mg by mouth daily. , Disp: , Rfl:  .  allopurinol (ZYLOPRIM) 100 MG tablet, 200 mg p.o. x1 week, then increase to 300 mg p.o., Disp: 80 tablet, Rfl: 0  Allergies  Allergen Reactions  .  Cortisone     Pt stated, "I am only allergic to cortisone injections"  . Nsaids Other (See Comments)    GI Bleeding    OBJECTIVE: BP 138/82 (BP Location: Left Arm, Patient Position: Sitting, Cuff Size: Large)   Pulse 96   Temp 98.4 F (36.9 C) (Temporal)   Resp 18   Ht 5\' 7"  (1.702 m)   Wt 217 lb 4 oz (98.5 kg)   SpO2 96%   BMI 34.03 kg/m  Gen: Afebrile. No acute distress.  HENT: AT. [place].  Eyes:Pupils Equal Round Reactive to light, Extraocular movements intact,  Conjunctiva without redness, discharge or icterus. MSK: Right thumb with mild swelling and prior gout location.  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  Neuro:  Normal gait. PERLA. EOMi. Alert. Oriented.  Psych:  Normal affect, dress and demeanor. Normal speech. Normal thought content and judgment.   ASSESSMENT AND PLAN: Francisco Cowan is a 69 y.o. male present for  Chronic gout: -Chemistry panel and uric acid levels collected today. -Start amlodipine 100 mg daily.  Patient was instructed on tapering instructions and goal of uric acid near 6. -Patient will be called with instructions after laboratory results received. Follow-up dependent upon lab results-please see phone note for further instruction  Howard Pouch, DO 08/13/2019   No follow-ups on file.  Orders Placed This Encounter  Procedures  . Basic Metabolic Panel (BMET)  . Uric acid   Meds ordered this encounter  Medications  . DISCONTD: allopurinol (ZYLOPRIM) 100 MG tablet    Sig: Take 1 tablet (100 mg total) by mouth daily.    Dispense:  30 tablet    Refill:  0   Referral Orders  No referral(s) requested today    15 minutes spent with patient today.

## 2019-08-09 NOTE — Patient Instructions (Addendum)
Start allopurinol 100 mg a day . We will call you Monday with lab results and instructions on increasing allopurinol if needed.    Gout  Gout is painful swelling of your joints. Gout is a type of arthritis. It is caused by having too much uric acid in your body. Uric acid is a chemical that is made when your body breaks down substances called purines. If your body has too much uric acid, sharp crystals can form and build up in your joints. This causes pain and swelling. Gout attacks can happen quickly and be very painful (acute gout). Over time, the attacks can affect more joints and happen more often (chronic gout). What are the causes?  Too much uric acid in your blood. This can happen because: ? Your kidneys do not remove enough uric acid from your blood. ? Your body makes too much uric acid. ? You eat too many foods that are high in purines. These foods include organ meats, some seafood, and beer.  Trauma or stress. What increases the risk?  Having a family history of gout.  Being male and middle-aged.  Being male and having gone through menopause.  Being very overweight (obese).  Drinking alcohol, especially beer.  Not having enough water in the body (being dehydrated).  Losing weight too quickly.  Having an organ transplant.  Having lead poisoning.  Taking certain medicines.  Having kidney disease.  Having a skin condition called psoriasis. What are the signs or symptoms? An attack of acute gout usually happens in just one joint. The most common place is the big toe. Attacks often start at night. Other joints that may be affected include joints of the feet, ankle, knee, fingers, wrist, or elbow. Symptoms of an attack may include:  Very bad pain.  Warmth.  Swelling.  Stiffness.  Shiny, red, or purple skin.  Tenderness. The affected joint may be very painful to touch.  Chills and fever. Chronic gout may cause symptoms more often. More joints may be  involved. You may also have white or yellow lumps (tophi) on your hands or feet or in other areas near your joints. How is this treated?  Treatment for this condition has two phases: treating an acute attack and preventing future attacks.  Acute gout treatment may include: ? NSAIDs. ? Steroids. These are taken by mouth or injected into a joint. ? Colchicine. This medicine relieves pain and swelling. It can be given by mouth or through an IV tube.  Preventive treatment may include: ? Taking small doses of NSAIDs or colchicine daily. ? Using a medicine that reduces uric acid levels in your blood. ? Making changes to your diet. You may need to see a food expert (dietitian) about what to eat and drink to prevent gout. Follow these instructions at home: During a gout attack   If told, put ice on the painful area: ? Put ice in a plastic bag. ? Place a towel between your skin and the bag. ? Leave the ice on for 20 minutes, 2-3 times a day.  Raise (elevate) the painful joint above the level of your heart as often as you can.  Rest the joint as much as possible. If the joint is in your leg, you may be given crutches.  Follow instructions from your doctor about what you cannot eat or drink. Avoiding future gout attacks  Eat a low-purine diet. Avoid foods and drinks such as: ? Liver. ? Kidney. ? Anchovies. ? Asparagus. ? Herring. ?  Mushrooms. ? Mussels. ? Beer.  Stay at a healthy weight. If you want to lose weight, talk with your doctor. Do not lose weight too fast.  Start or continue an exercise plan as told by your doctor. Eating and drinking  Drink enough fluids to keep your pee (urine) pale yellow.  If you drink alcohol: ? Limit how much you use to:  0-1 drink a day for women.  0-2 drinks a day for men. ? Be aware of how much alcohol is in your drink. In the [place] one drink equals one 12 oz bottle of beer (355 mL), one 5 oz glass of wine (148 mL), or one 1 oz glass of  hard liquor (44 mL). General instructions  Take over-the-counter and prescription medicines only as told by your doctor.  Do not drive or use heavy machinery while taking prescription pain medicine.  Return to your normal activities as told by your doctor. Ask your doctor what activities are safe for you.  Keep all follow-up visits as told by your doctor. This is important. Contact a doctor if:  You have another gout attack.  You still have symptoms of a gout attack after 10 days of treatment.  You have problems (side effects) because of your medicines.  You have chills or a fever.  You have burning pain when you pee (urinate).  You have pain in your lower back or belly. Get help right away if:  You have very bad pain.  Your pain cannot be controlled.  You cannot pee. Summary  Gout is painful swelling of the joints.  The most common site of pain is the big toe, but it can affect other joints.  Medicines and avoiding some foods can help to prevent and treat gout attacks. This information is not intended to replace advice given to you by your health care provider. Make sure you discuss any questions you have with your health care provider. Document Revised: 09/06/2017 Document Reviewed: 09/06/2017 Elsevier Patient Education  [place].

## 2019-08-10 LAB — BASIC METABOLIC PANEL
BUN: 20 mg/dL (ref 7–25)
CO2: 25 mmol/L (ref 20–32)
Calcium: 10 mg/dL (ref 8.6–10.3)
Chloride: 100 mmol/L (ref 98–110)
Creat: 0.98 mg/dL (ref 0.70–1.25)
Glucose, Bld: 133 mg/dL — ABNORMAL HIGH (ref 65–99)
Potassium: 4.4 mmol/L (ref 3.5–5.3)
Sodium: 135 mmol/L (ref 135–146)

## 2019-08-10 LAB — URIC ACID: Uric Acid, Serum: 8.4 mg/dL — ABNORMAL HIGH (ref 4.0–8.0)

## 2019-08-12 ENCOUNTER — Other Ambulatory Visit: Payer: Self-pay | Admitting: Family Medicine

## 2019-08-12 DIAGNOSIS — M1A9XX0 Chronic gout, unspecified, without tophus (tophi): Secondary | ICD-10-CM

## 2019-08-12 MED ORDER — ALLOPURINOL 100 MG PO TABS: ORAL_TABLET | ORAL | 0 refills | Status: DC

## 2019-09-18 ENCOUNTER — Other Ambulatory Visit: Payer: Self-pay

## 2019-09-18 DIAGNOSIS — F320 Major depressive disorder, single episode, mild: Secondary | ICD-10-CM | POA: Diagnosis not present

## 2019-09-18 DIAGNOSIS — K219 Gastro-esophageal reflux disease without esophagitis: Secondary | ICD-10-CM | POA: Diagnosis not present

## 2019-09-18 DIAGNOSIS — I10 Essential (primary) hypertension: Secondary | ICD-10-CM | POA: Diagnosis not present

## 2019-09-18 DIAGNOSIS — M1A9XX0 Chronic gout, unspecified, without tophus (tophi): Secondary | ICD-10-CM

## 2019-09-18 DIAGNOSIS — R7303 Prediabetes: Secondary | ICD-10-CM | POA: Diagnosis not present

## 2019-09-18 DIAGNOSIS — Z79899 Other long term (current) drug therapy: Secondary | ICD-10-CM | POA: Diagnosis not present

## 2019-09-18 MED ORDER — ALLOPURINOL 100 MG PO TABS: ORAL_TABLET | ORAL | 0 refills | Status: DC

## 2019-09-24 ENCOUNTER — Encounter: Payer: Self-pay | Admitting: Family Medicine

## 2019-09-24 DIAGNOSIS — R7303 Prediabetes: Secondary | ICD-10-CM | POA: Diagnosis not present

## 2019-09-24 DIAGNOSIS — Z79899 Other long term (current) drug therapy: Secondary | ICD-10-CM | POA: Diagnosis not present

## 2019-10-16 ENCOUNTER — Other Ambulatory Visit: Payer: Self-pay | Admitting: Family Medicine

## 2019-10-16 DIAGNOSIS — M1A9XX0 Chronic gout, unspecified, without tophus (tophi): Secondary | ICD-10-CM

## 2019-10-16 DIAGNOSIS — R7303 Prediabetes: Secondary | ICD-10-CM | POA: Diagnosis not present

## 2019-10-28 ENCOUNTER — Other Ambulatory Visit: Payer: Self-pay | Admitting: Family Medicine

## 2019-10-28 DIAGNOSIS — M1A9XX0 Chronic gout, unspecified, without tophus (tophi): Secondary | ICD-10-CM

## 2019-10-28 MED ORDER — ALLOPURINOL 100 MG PO TABS: 300.0000 mg | ORAL_TABLET | Freq: Every day | ORAL | 3 refills | Status: DC

## 2019-11-11 ENCOUNTER — Telehealth: Payer: Self-pay

## 2019-11-11 NOTE — Telephone Encounter (Signed)
Pt refused appt scheduling stated that he would get by on tylenol.

## 2019-11-11 NOTE — Telephone Encounter (Signed)
Please advise,  Pt requesting medication for gout flare up. Pt does not specify which medication it is that he is needing.

## 2019-11-11 NOTE — Telephone Encounter (Signed)
Patient reports he has a gout flare up.  He is requesting refill on meds for gout.  [facility]   Please call patient [phone]

## 2019-11-11 NOTE — Telephone Encounter (Signed)
Please schedule patient an appointment if he is having a gout flare and desires treatment.

## 2019-11-12 ENCOUNTER — Other Ambulatory Visit: Payer: Self-pay

## 2019-11-12 ENCOUNTER — Telehealth (INDEPENDENT_AMBULATORY_CARE_PROVIDER_SITE_OTHER): Payer: Medicare Other | Admitting: Family Medicine

## 2019-11-12 ENCOUNTER — Encounter: Payer: Self-pay | Admitting: Family Medicine

## 2019-11-12 VITALS — BP 120/80 | Wt 200.0 lb

## 2019-11-12 DIAGNOSIS — M109 Gout, unspecified: Secondary | ICD-10-CM

## 2019-11-12 MED ORDER — PREDNISONE 20 MG PO TABS: ORAL_TABLET | ORAL | 0 refills | Status: DC

## 2019-11-12 NOTE — Progress Notes (Signed)
VIRTUAL VISIT VIA VIDEO  I connected with [name] on 11/12/19 at  4:00 PM EDT by a video enabled telemedicine application and verified that I am speaking with the correct person using two identifiers. Location patient: Home Location provider: [facility], Office Persons participating in the virtual visit: Patient, Dr. Raoul Pitch, Mellody Drown, CMA  I discussed the limitations of evaluation and management by telemedicine and the availability of in person appointments. The patient expressed understanding and agreed to proceed.   SUBJECTIVE Chief Complaint  Patient presents with  . Gout    HPI: ABENEZER ODONELL is a 69 y.o. patient presents today to discuss his gout. He has been started on allopurinol taper to 300 mg after his last visit and was to follow up in end of July for repeat uric acid level. He did not follow up for uric acid level. He reports compliance with allopurinol 300. Current gout flare started 5 days ago in his right foot.  He has responded to prednisone taper in the past for gout flare.    Prior note: He is having more frequent gout flares and would like to restart allopurinol.  He states he was on this medication at one time.  He believes he ended up needing to take allopurinol 300 mg daily. Prior note: He states it started 2-3 days ago with redness, swelling and pain. He wonders if stress is a factor. It seems to be consistent variable. He has had similar presentation in the same location last year in August. That particular time was treated with prednisone taper. He is unable to tolerate NSAIDS. He states he recalls great improvement shortly after starting the prednisone last time.   ROS: See pertinent positives and negatives per HPI.  Patient Active Problem List   Diagnosis Date Noted  . Obesity (BMI 30-39.9) 10/08/2018  . Acute gout of left ankle 10/08/2018  . Ingrown nail of second toe of left foot 10/08/2018  . S/P lumbar fusion 11/22/2017    . Allergy 10/12/2017  . GERD (gastroesophageal reflux disease) 10/12/2017  . Spinal stenosis 10/12/2017  . Depression, major, single episode, mild ([place]) 10/12/2017  . GAD (generalized anxiety disorder) 10/12/2017  . Elevated glucose 10/11/2017  . Essential hypertension 10/11/2017  . Degeneration of lumbar intervertebral disc 08/01/2017  . Degenerative lumbar spinal stenosis 08/01/2017  . Degenerative spondylolisthesis 08/01/2017    Social History   Tobacco Use  . Smoking status: Former Smoker    Years: 31.00    Quit date: 2002    Years since quitting: 19.7  . Smokeless tobacco: Never Used  Substance Use Topics  . Alcohol use: Yes    Alcohol/week: 28.0 standard drinks    Types: 14 Cans of beer, 14 Shots of liquor per week    Current Outpatient Medications:  .  allopurinol (ZYLOPRIM) 100 MG tablet, Take 3 tablets (300 mg total) by mouth daily., Disp: 90 tablet, Rfl: 3 .  amLODipine (NORVASC) 2.5 MG tablet, Take 2.5 mg by mouth daily., Disp: , Rfl:  .  buPROPion (WELLBUTRIN SR) 150 MG 12 hr tablet, Take 150 mg by mouth See admin instructions. Taking with the Wellbutrin 300mg  dose for a total of 450mg  daily, Disp: , Rfl:  .  buPROPion (WELLBUTRIN XL) 300 MG 24 hr tablet, Take 300 mg by mouth daily. Taking with Wellbutrin SR 150mg  for 450mg  Total daily., Disp: , Rfl:  .  fluticasone (FLONASE) 50 MCG/ACT nasal spray, Place 1 spray into both nostrils as  needed for allergies. , Disp: , Rfl:  .  lisinopril (PRINIVIL,ZESTRIL) 10 MG tablet, Take 10 mg by mouth daily., Disp: , Rfl:  .  LORazepam (ATIVAN) 1 MG tablet, Take 1 mg by mouth every 8 (eight) hours as needed for anxiety or sleep. , Disp: , Rfl:  .  montelukast (SINGULAIR) 10 MG tablet, Take 10 mg by mouth at bedtime., Disp: , Rfl:  .  Multiple Vitamin (MULTIVITAMIN) capsule, Take 1 capsule by mouth daily., Disp: , Rfl:  .  pantoprazole (PROTONIX) 40 MG tablet, Take 40 mg by mouth daily., Disp: , Rfl:  .  sertraline (ZOLOFT) 50 MG  tablet, Take 1 tablet by mouth daily., Disp: , Rfl:  .  tamsulosin (FLOMAX) 0.4 MG CAPS capsule, Take 0.4 mg by mouth daily. , Disp: , Rfl:  .  predniSONE (DELTASONE) 20 MG tablet, 60 mg x3d, 40 mg x3d, 20 mg x2d, 10 mg x2d, Disp: 18 tablet, Rfl: 0  Allergies  Allergen Reactions  . Cortisone     Pt stated, "I am only allergic to cortisone injections"  . Nsaids Other (See Comments)    GI Bleeding    OBJECTIVE: BP 120/80   Wt 200 lb (90.7 kg)   BMI 31.32 kg/m  Gen: Afebrile. No acute distress.  HENT: AT. [place]  MSK: swelling right foot.  Skin: no rashes, purpura or petechiae.  Neuro:  Alert. Oriented   ASSESSMENT AND PLAN: TORRES HARDENBROOK is a 69 y.o. male present for  Chronic gout: - prednisone burst  -increase  allopurinol 400 mg daily.  goal of uric acid near 6.> lab appt 4 weeks.  S/u PRn or in 1 week if not improving, sooner if worsening.    eRenee Marja Adderley, DO 11/12/2019   No follow-ups on file.  Orders Placed This Encounter  Procedures  . Uric acid   Meds ordered this encounter  Medications  . predniSONE (DELTASONE) 20 MG tablet    Sig: 60 mg x3d, 40 mg x3d, 20 mg x2d, 10 mg x2d    Dispense:  18 tablet    Refill:  0   Referral Orders  No referral(s) requested today    15 minutes spent with patient today.

## 2019-11-19 DIAGNOSIS — F411 Generalized anxiety disorder: Secondary | ICD-10-CM | POA: Diagnosis not present

## 2019-11-19 DIAGNOSIS — F332 Major depressive disorder, recurrent severe without psychotic features: Secondary | ICD-10-CM | POA: Diagnosis not present

## 2019-12-04 ENCOUNTER — Other Ambulatory Visit: Payer: Self-pay

## 2019-12-04 ENCOUNTER — Ambulatory Visit (INDEPENDENT_AMBULATORY_CARE_PROVIDER_SITE_OTHER): Payer: Medicare Other

## 2019-12-04 ENCOUNTER — Other Ambulatory Visit: Payer: Self-pay | Admitting: Family Medicine

## 2019-12-04 DIAGNOSIS — M1A9XX0 Chronic gout, unspecified, without tophus (tophi): Secondary | ICD-10-CM

## 2019-12-04 DIAGNOSIS — M109 Gout, unspecified: Secondary | ICD-10-CM

## 2019-12-04 LAB — URIC ACID: Uric Acid, Serum: 2.9 mg/dL — ABNORMAL LOW (ref 4.0–7.8)

## 2019-12-04 MED ORDER — ALLOPURINOL 300 MG PO TABS: 450.0000 mg | ORAL_TABLET | Freq: Every day | ORAL | 3 refills | Status: DC

## 2019-12-17 DIAGNOSIS — D225 Melanocytic nevi of trunk: Secondary | ICD-10-CM | POA: Diagnosis not present

## 2019-12-17 DIAGNOSIS — L569 Acute skin change due to ultraviolet radiation, unspecified: Secondary | ICD-10-CM | POA: Diagnosis not present

## 2019-12-17 DIAGNOSIS — F331 Major depressive disorder, recurrent, moderate: Secondary | ICD-10-CM | POA: Diagnosis not present

## 2019-12-17 DIAGNOSIS — D2262 Melanocytic nevi of left upper limb, including shoulder: Secondary | ICD-10-CM | POA: Diagnosis not present

## 2019-12-17 DIAGNOSIS — D235 Other benign neoplasm of skin of trunk: Secondary | ICD-10-CM | POA: Diagnosis not present

## 2019-12-17 DIAGNOSIS — Z79899 Other long term (current) drug therapy: Secondary | ICD-10-CM | POA: Diagnosis not present

## 2019-12-17 DIAGNOSIS — D2261 Melanocytic nevi of right upper limb, including shoulder: Secondary | ICD-10-CM | POA: Diagnosis not present

## 2019-12-17 DIAGNOSIS — F332 Major depressive disorder, recurrent severe without psychotic features: Secondary | ICD-10-CM | POA: Diagnosis not present

## 2019-12-17 DIAGNOSIS — L821 Other seborrheic keratosis: Secondary | ICD-10-CM | POA: Diagnosis not present

## 2019-12-17 DIAGNOSIS — D485 Neoplasm of uncertain behavior of skin: Secondary | ICD-10-CM | POA: Diagnosis not present

## 2019-12-17 DIAGNOSIS — B353 Tinea pedis: Secondary | ICD-10-CM | POA: Diagnosis not present

## 2019-12-17 DIAGNOSIS — F4312 Post-traumatic stress disorder, chronic: Secondary | ICD-10-CM | POA: Diagnosis not present

## 2019-12-17 DIAGNOSIS — F5101 Primary insomnia: Secondary | ICD-10-CM | POA: Diagnosis not present

## 2019-12-17 DIAGNOSIS — F411 Generalized anxiety disorder: Secondary | ICD-10-CM | POA: Diagnosis not present

## 2019-12-17 DIAGNOSIS — L812 Freckles: Secondary | ICD-10-CM | POA: Diagnosis not present

## 2019-12-26 DIAGNOSIS — M65841 Other synovitis and tenosynovitis, right hand: Secondary | ICD-10-CM | POA: Diagnosis not present

## 2019-12-26 DIAGNOSIS — L568 Other specified acute skin changes due to ultraviolet radiation: Secondary | ICD-10-CM | POA: Diagnosis not present

## 2019-12-26 DIAGNOSIS — M2550 Pain in unspecified joint: Secondary | ICD-10-CM | POA: Diagnosis not present

## 2019-12-26 DIAGNOSIS — M7732 Calcaneal spur, left foot: Secondary | ICD-10-CM | POA: Diagnosis not present

## 2019-12-26 DIAGNOSIS — M130 Polyarthritis, unspecified: Secondary | ICD-10-CM | POA: Diagnosis not present

## 2019-12-26 DIAGNOSIS — R609 Edema, unspecified: Secondary | ICD-10-CM | POA: Diagnosis not present

## 2019-12-26 DIAGNOSIS — M79641 Pain in right hand: Secondary | ICD-10-CM | POA: Diagnosis not present

## 2019-12-26 DIAGNOSIS — M25531 Pain in right wrist: Secondary | ICD-10-CM | POA: Diagnosis not present

## 2019-12-26 DIAGNOSIS — M7731 Calcaneal spur, right foot: Secondary | ICD-10-CM | POA: Diagnosis not present

## 2019-12-27 DIAGNOSIS — I10 Essential (primary) hypertension: Secondary | ICD-10-CM | POA: Diagnosis not present

## 2019-12-27 DIAGNOSIS — R7303 Prediabetes: Secondary | ICD-10-CM | POA: Diagnosis not present

## 2019-12-27 DIAGNOSIS — M79641 Pain in right hand: Secondary | ICD-10-CM | POA: Diagnosis not present

## 2019-12-27 DIAGNOSIS — R972 Elevated prostate specific antigen [PSA]: Secondary | ICD-10-CM | POA: Diagnosis not present

## 2019-12-31 DIAGNOSIS — H2513 Age-related nuclear cataract, bilateral: Secondary | ICD-10-CM | POA: Diagnosis not present

## 2019-12-31 DIAGNOSIS — H25013 Cortical age-related cataract, bilateral: Secondary | ICD-10-CM | POA: Diagnosis not present

## 2019-12-31 DIAGNOSIS — H5010 Unspecified exotropia: Secondary | ICD-10-CM | POA: Diagnosis not present

## 2019-12-31 DIAGNOSIS — H35371 Puckering of macula, right eye: Secondary | ICD-10-CM | POA: Diagnosis not present

## 2020-01-01 DIAGNOSIS — L568 Other specified acute skin changes due to ultraviolet radiation: Secondary | ICD-10-CM | POA: Diagnosis not present

## 2020-01-01 DIAGNOSIS — M1A9XX0 Chronic gout, unspecified, without tophus (tophi): Secondary | ICD-10-CM | POA: Diagnosis not present

## 2020-01-01 DIAGNOSIS — M65841 Other synovitis and tenosynovitis, right hand: Secondary | ICD-10-CM | POA: Diagnosis not present

## 2020-01-13 DIAGNOSIS — M65841 Other synovitis and tenosynovitis, right hand: Secondary | ICD-10-CM | POA: Diagnosis not present

## 2020-01-13 DIAGNOSIS — M109 Gout, unspecified: Secondary | ICD-10-CM | POA: Diagnosis not present

## 2020-01-13 DIAGNOSIS — Z5189 Encounter for other specified aftercare: Secondary | ICD-10-CM | POA: Diagnosis not present

## 2020-01-13 DIAGNOSIS — M79641 Pain in right hand: Secondary | ICD-10-CM | POA: Diagnosis not present

## 2020-01-14 DIAGNOSIS — M1A9XX0 Chronic gout, unspecified, without tophus (tophi): Secondary | ICD-10-CM | POA: Diagnosis not present

## 2020-01-14 DIAGNOSIS — M65841 Other synovitis and tenosynovitis, right hand: Secondary | ICD-10-CM | POA: Diagnosis not present

## 2020-01-14 DIAGNOSIS — M130 Polyarthritis, unspecified: Secondary | ICD-10-CM | POA: Diagnosis not present

## 2020-01-15 DIAGNOSIS — M7732 Calcaneal spur, left foot: Secondary | ICD-10-CM | POA: Diagnosis not present

## 2020-01-15 DIAGNOSIS — M65841 Other synovitis and tenosynovitis, right hand: Secondary | ICD-10-CM | POA: Diagnosis not present

## 2020-01-15 DIAGNOSIS — M7731 Calcaneal spur, right foot: Secondary | ICD-10-CM | POA: Diagnosis not present

## 2020-01-15 DIAGNOSIS — M25531 Pain in right wrist: Secondary | ICD-10-CM | POA: Diagnosis not present

## 2020-01-15 DIAGNOSIS — M109 Gout, unspecified: Secondary | ICD-10-CM | POA: Diagnosis not present

## 2020-01-15 DIAGNOSIS — Z5189 Encounter for other specified aftercare: Secondary | ICD-10-CM | POA: Diagnosis not present

## 2020-01-15 DIAGNOSIS — M79641 Pain in right hand: Secondary | ICD-10-CM | POA: Diagnosis not present

## 2020-01-16 DIAGNOSIS — L568 Other specified acute skin changes due to ultraviolet radiation: Secondary | ICD-10-CM | POA: Diagnosis not present

## 2020-01-16 DIAGNOSIS — M1A9XX0 Chronic gout, unspecified, without tophus (tophi): Secondary | ICD-10-CM | POA: Diagnosis not present

## 2020-01-16 DIAGNOSIS — M65841 Other synovitis and tenosynovitis, right hand: Secondary | ICD-10-CM | POA: Diagnosis not present

## 2020-01-16 DIAGNOSIS — M130 Polyarthritis, unspecified: Secondary | ICD-10-CM | POA: Diagnosis not present

## 2020-01-20 DIAGNOSIS — M79641 Pain in right hand: Secondary | ICD-10-CM | POA: Diagnosis not present

## 2020-01-20 DIAGNOSIS — M65841 Other synovitis and tenosynovitis, right hand: Secondary | ICD-10-CM | POA: Diagnosis not present

## 2020-01-20 DIAGNOSIS — Z5189 Encounter for other specified aftercare: Secondary | ICD-10-CM | POA: Diagnosis not present

## 2020-01-20 DIAGNOSIS — M109 Gout, unspecified: Secondary | ICD-10-CM | POA: Diagnosis not present

## 2020-02-04 DIAGNOSIS — M79641 Pain in right hand: Secondary | ICD-10-CM | POA: Diagnosis not present

## 2020-02-04 DIAGNOSIS — Z5189 Encounter for other specified aftercare: Secondary | ICD-10-CM | POA: Diagnosis not present

## 2020-02-04 DIAGNOSIS — M65841 Other synovitis and tenosynovitis, right hand: Secondary | ICD-10-CM | POA: Diagnosis not present

## 2020-02-07 DIAGNOSIS — M65841 Other synovitis and tenosynovitis, right hand: Secondary | ICD-10-CM | POA: Diagnosis not present

## 2020-02-07 DIAGNOSIS — Z5189 Encounter for other specified aftercare: Secondary | ICD-10-CM | POA: Diagnosis not present

## 2020-02-07 DIAGNOSIS — M79641 Pain in right hand: Secondary | ICD-10-CM | POA: Diagnosis not present

## 2020-02-11 DIAGNOSIS — Z5189 Encounter for other specified aftercare: Secondary | ICD-10-CM | POA: Diagnosis not present

## 2020-02-11 DIAGNOSIS — M79641 Pain in right hand: Secondary | ICD-10-CM | POA: Diagnosis not present

## 2020-02-11 DIAGNOSIS — M65841 Other synovitis and tenosynovitis, right hand: Secondary | ICD-10-CM | POA: Diagnosis not present

## 2020-02-13 DIAGNOSIS — M47812 Spondylosis without myelopathy or radiculopathy, cervical region: Secondary | ICD-10-CM | POA: Diagnosis not present

## 2020-02-14 DIAGNOSIS — Z5189 Encounter for other specified aftercare: Secondary | ICD-10-CM | POA: Diagnosis not present

## 2020-02-14 DIAGNOSIS — M79641 Pain in right hand: Secondary | ICD-10-CM | POA: Diagnosis not present

## 2020-02-14 DIAGNOSIS — M65841 Other synovitis and tenosynovitis, right hand: Secondary | ICD-10-CM | POA: Diagnosis not present

## 2020-02-18 DIAGNOSIS — M79641 Pain in right hand: Secondary | ICD-10-CM | POA: Diagnosis not present

## 2020-02-18 DIAGNOSIS — M65841 Other synovitis and tenosynovitis, right hand: Secondary | ICD-10-CM | POA: Diagnosis not present

## 2020-02-18 DIAGNOSIS — Z5189 Encounter for other specified aftercare: Secondary | ICD-10-CM | POA: Diagnosis not present

## 2020-02-27 DIAGNOSIS — H43393 Other vitreous opacities, bilateral: Secondary | ICD-10-CM | POA: Diagnosis not present

## 2020-02-27 DIAGNOSIS — H43813 Vitreous degeneration, bilateral: Secondary | ICD-10-CM | POA: Diagnosis not present

## 2020-02-27 DIAGNOSIS — H2513 Age-related nuclear cataract, bilateral: Secondary | ICD-10-CM | POA: Diagnosis not present

## 2020-02-27 DIAGNOSIS — H35371 Puckering of macula, right eye: Secondary | ICD-10-CM | POA: Diagnosis not present

## 2020-10-15 ENCOUNTER — Encounter: Payer: Self-pay | Admitting: Family Medicine

## 2020-10-15 ENCOUNTER — Other Ambulatory Visit: Payer: Self-pay

## 2020-10-15 ENCOUNTER — Ambulatory Visit (INDEPENDENT_AMBULATORY_CARE_PROVIDER_SITE_OTHER): Payer: Medicare Other | Admitting: Family Medicine

## 2020-10-15 VITALS — BP 137/83 | HR 73 | Temp 98.6°F | Wt 204.0 lb

## 2020-10-15 DIAGNOSIS — R239 Unspecified skin changes: Secondary | ICD-10-CM

## 2020-10-15 NOTE — Progress Notes (Signed)
OFFICE VISIT  10/15/2020  CC:  Chief Complaint  Patient presents with   odor    Pt c/o foul odor and discharge x 12 yrs with odor in the last 3 days   HPI:    Patient is a 70 y.o. male who presents for "belly button stinks and has discharge". Irritated area on belly button on/off last couple years, occ blood spot on t shirt. No pain or itching.  No topical med applied. Has smelled foul last few days.   Past Medical History:  Diagnosis Date   Abnormal ECG 09/19/2017   NSR. Left axis deviation.    Alcohol abuse    Anxiety    Depression    ED (erectile dysfunction)    GERD (gastroesophageal reflux disease)    History of kidney stones    Hypertension    Moderate tetrahydrocannabinol (THC) dependence (HCC)    Photodermatitis    Spinal stenosis    Urge incontinence     Past Surgical History:  Procedure Laterality Date   CARPAL TUNNEL RELEASE Bilateral 1991   COLONOSCOPY     CYST REMOVAL TRUNK N/A 07/2014   back   ESOPHAGOGASTRODUODENOSCOPY     ROTATOR CUFF REPAIR Left 2003/2018   ROTATOR CUFF REPAIR Right 02/10/2014   Study: Echo  06/27/2016   LV size normal.  LV wall thickness normal.  LVEF 55-60%.  Diastolic filling pattern indicates impaired relaxation.  GLS test 17%.  LA is mildly dilated.  Moderate AV sclerosis without stenosis.  Trace MR.  RVSP 22.25 mmHg p   TRANSFORAMINAL LUMBAR INTERBODY FUSION (TLIF) WITH PEDICLE SCREW FIXATION 1 LEVEL N/A 11/22/2017   Procedure: TRANSFORAMINAL LUMBAR INTERBODY FUSION Lumbar four-five;  Surgeon: Melina Schools, MD;  Location: [facility];  Service: Orthopedics;  Laterality: N/A;  4.5 hrs   ULNAR NERVE TRANSPOSITION Left 2018    Outpatient Medications Prior to Visit  Medication Sig Dispense Refill   allopurinol (ZYLOPRIM) 300 MG tablet Take 1.5 tablets (450 mg total) by mouth daily. 135 tablet 3   amLODipine (NORVASC) 2.5 MG tablet Take 2.5 mg by mouth daily.     buPROPion (WELLBUTRIN SR) 150 MG 12 hr tablet Take 150 mg by mouth See  admin instructions. Taking with the Wellbutrin '300mg'$  dose for a total of '450mg'$  daily     buPROPion (WELLBUTRIN XL) 300 MG 24 hr tablet Take 300 mg by mouth daily. Taking with Wellbutrin SR '150mg'$  for '450mg'$  Total daily.     fluticasone (FLONASE) 50 MCG/ACT nasal spray Place 1 spray into both nostrils as needed for allergies.      lisinopril (PRINIVIL,ZESTRIL) 10 MG tablet Take 10 mg by mouth daily.     LORazepam (ATIVAN) 1 MG tablet Take 1 mg by mouth every 8 (eight) hours as needed for anxiety or sleep.      montelukast (SINGULAIR) 10 MG tablet Take 10 mg by mouth at bedtime.     Multiple Vitamin (MULTIVITAMIN) capsule Take 1 capsule by mouth daily.     pantoprazole (PROTONIX) 40 MG tablet Take 40 mg by mouth daily.     predniSONE (DELTASONE) 20 MG tablet 60 mg x3d, 40 mg x3d, 20 mg x2d, 10 mg x2d 18 tablet 0   sertraline (ZOLOFT) 50 MG tablet Take 1 tablet by mouth daily.     tamsulosin (FLOMAX) 0.4 MG CAPS capsule Take 0.4 mg by mouth daily.      No facility-administered medications prior to visit.    Allergies  Allergen Reactions   Cortisone  Pt stated, "I am only allergic to cortisone injections"   Nsaids Other (See Comments)    GI Bleeding    ROS As per HPI  PE: Vitals with BMI 10/15/2020 11/12/2019 08/09/2019  Height - - '5\' 7"'$   Weight 204 lbs 200 lbs 217 lbs 4 oz  BMI - - [id number]  Systolic 0000000 [id number] 0000000  Diastolic 83 80 82  Pulse 73 - 96    Gen: Alert, well appearing.  Patient is oriented to person, place, time, and situation. AFFECT: pleasant, lucid thought and speech. Abd: his umbilicus is w/out rash, erythema, or swelling.  I probed with a cotton tipped swab and a scant amount of yellow moisture was obtained.  No tenderness. No maceration.  LABS:    Chemistry      Component Value Date/Time   NA 135 08/09/2019 1530   K 4.4 08/09/2019 1530   CL 100 08/09/2019 1530   CO2 25 08/09/2019 1530   BUN 20 08/09/2019 1530   CREATININE 0.98 08/09/2019 1530      Component  Value Date/Time   CALCIUM 10.0 08/09/2019 1530   ALKPHOS 49 11/13/2017 1200   AST 26 11/13/2017 1200   ALT 21 11/13/2017 1200   BILITOT 1.1 11/13/2017 1200     Lab Results  Component Value Date   WBC 7.4 11/13/2017   HGB 15.0 11/13/2017   HCT 45.4 11/13/2017   MCV 97.6 11/13/2017   PLT 181 11/13/2017   Lab Results  Component Value Date   HGBA1C 5.1 10/11/2017   IMPRESSION AND PLAN:  Umbilicus odor: his exam is normal.  I suspect the odor is simply due to a deep umbilicus that traps water after bathing and the moisture combines with sloughed skin cells to make a slight yellow + odor.  Discussed using q tip after every bath/shower to gently clean/dry the internal/deep aspect of umbilicus.   Reassured pt.  An After Visit Summary was printed and given to the patient.  FOLLOW UP: No follow-ups on file.  Signed:  Crissie Sickles, MD           10/15/2020

## 2020-11-22 ENCOUNTER — Telehealth: Payer: Self-pay

## 2020-11-22 NOTE — Telephone Encounter (Signed)
   Medicaid Managed Care   Unsuccessful Attempt Note   11/22/2020 Name: Francisco Cowan MRN: [id number] DOB: 12/01/1950   Reason for call: schedule AWV  Follow Up Plan: A HIPAA compliant phone message was left for the patient providing contact information and requesting a return call.   Eugenia Mcalpine

## 2021-01-28 ENCOUNTER — Other Ambulatory Visit: Payer: Self-pay

## 2021-01-28 DIAGNOSIS — M1A9XX0 Chronic gout, unspecified, without tophus (tophi): Secondary | ICD-10-CM

## 2021-01-28 MED ORDER — ALLOPURINOL 300 MG PO TABS: 450.0000 mg | ORAL_TABLET | Freq: Every day | ORAL | 0 refills | Status: DC

## 2021-05-05 ENCOUNTER — Telehealth: Payer: Self-pay

## 2021-05-05 NOTE — Telephone Encounter (Signed)
A user error has taken place: encounter opened in error, closed for administrative reasons.

## 2021-08-06 ENCOUNTER — Encounter: Payer: Self-pay | Admitting: Podiatry

## 2021-08-06 ENCOUNTER — Ambulatory Visit (INDEPENDENT_AMBULATORY_CARE_PROVIDER_SITE_OTHER): Payer: Medicare Other | Admitting: Podiatry

## 2021-08-06 DIAGNOSIS — L600 Ingrowing nail: Secondary | ICD-10-CM

## 2021-08-06 NOTE — Patient Instructions (Signed)

## 2021-08-06 NOTE — Progress Notes (Signed)
Subjective:   Patient ID: Francisco Cowan, male   DOB: 71 y.o.   MRN: [id number]   HPI Patient presents stating I need to get my big toe nail removed left as its become very tender on the nailbed.  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  I infiltrated the left hallux 60 mg like Marcaine mixture sterile prep and using sterile instrumentation remove the hallux nail exposed matrix and applied phenol 5 applications 30 seconds followed by alcohol by sterile dressing gave instructions on soaks and leave dressing on 24 hours but take it off earlier if any throbbing were to occur.  Encouraged him to call questions concerns which may arise

## 2021-09-01 ENCOUNTER — Ambulatory Visit: Payer: Medicare Other | Admitting: Family Medicine

## 2021-09-01 NOTE — Telephone Encounter (Signed)
Please advise. Pt hasn't seen you for CPE or chronic conditions being controlled by Pcp in [place]. Pt was informed me of PCP in 05/20 mychart message.

## 2021-09-01 NOTE — Telephone Encounter (Signed)
This was patient I was told to cancel appt because he was not a [facility] patient.  He has had several visits with our office.  Please advise patient.

## 2021-09-01 NOTE — Telephone Encounter (Signed)
Does not seem that patient has seen this provider since establishing with new PCP.

## 2021-09-06 NOTE — Telephone Encounter (Signed)
Pt states that he is est with you and with pcp in [place]. Provider in [facility] takes care of meds but would like to have you avail when he is in [place] if needs to see doctor.

## 2021-09-06 NOTE — Telephone Encounter (Signed)
We have to be the primary team to provide service. We are primary care physicians/clinic. He is desiring acute care/sick visits only, and that is not the type of service we are- that would be urgent care.

## 2021-09-06 NOTE — Telephone Encounter (Signed)
I do not think I understand the question at hand?   If he is seeing [place], and he is due for an appointment or refill then he needs to make an appointment.  If he is not seeing [place] and is established in [place], then he needs to call back provider for his refills. Sorry. Dr. Raliegh Ip

## 2021-09-07 NOTE — Telephone Encounter (Signed)
Called pt to explain the reasoning of cancelling most resent appt. Pt was explained ho states he lives 6 mos in [place] and 6 mos in [place]. Pt wanted to know what is he to do if he need to see a provider in [place] and was sick pt was recommended that he goes to an UC. Pt stated that we as a medical facility should be ashamed of ourselves and disconnecting call.

## 2021-10-21 ENCOUNTER — Telehealth: Payer: Self-pay

## 2021-10-21 NOTE — Telephone Encounter (Signed)
A user error has taken place: encounter opened in error, closed for administrative reasons.

## 2023-01-19 ENCOUNTER — Encounter: Payer: Self-pay | Admitting: Family Medicine

## 2023-01-19 ENCOUNTER — Ambulatory Visit: Payer: Medicare Other | Admitting: Family Medicine

## 2023-01-19 VITALS — BP 132/93 | HR 67 | Ht 67.0 in | Wt 205.2 lb

## 2023-01-19 DIAGNOSIS — R58 Hemorrhage, not elsewhere classified: Secondary | ICD-10-CM

## 2023-01-19 DIAGNOSIS — Z7689 Persons encountering health services in other specified circumstances: Secondary | ICD-10-CM | POA: Diagnosis not present

## 2023-01-19 DIAGNOSIS — M1A9XX0 Chronic gout, unspecified, without tophus (tophi): Secondary | ICD-10-CM

## 2023-01-19 MED ORDER — ALLOPURINOL 300 MG PO TABS: 450.0000 mg | ORAL_TABLET | Freq: Every day | ORAL | Status: DC

## 2023-01-19 MED ORDER — SERTRALINE HCL 50 MG PO TABS: 50.0000 mg | ORAL_TABLET | Freq: Every day | ORAL | Status: DC

## 2023-01-19 MED ORDER — AMLODIPINE BESYLATE 2.5 MG PO TABS: 2.5000 mg | ORAL_TABLET | Freq: Every day | ORAL | Status: DC

## 2023-01-19 MED ORDER — TAMSULOSIN HCL 0.4 MG PO CAPS: 0.4000 mg | ORAL_CAPSULE | Freq: Every day | ORAL | Status: DC

## 2023-01-19 MED ORDER — PANTOPRAZOLE SODIUM 40 MG PO TBEC: 40.0000 mg | DELAYED_RELEASE_TABLET | Freq: Every day | ORAL | Status: DC

## 2023-01-19 MED ORDER — BUPROPION HCL ER (XL) 300 MG PO TB24: 300.0000 mg | ORAL_TABLET | Freq: Every day | ORAL | Status: DC

## 2023-01-19 MED ORDER — BUPROPION HCL ER (SR) 150 MG PO TB12: 150.0000 mg | ORAL_TABLET | ORAL | Status: DC

## 2023-01-19 MED ORDER — LORAZEPAM 1 MG PO TABS: 1.0000 mg | ORAL_TABLET | Freq: Three times a day (TID) | ORAL | Status: DC | PRN

## 2023-01-19 NOTE — Progress Notes (Cosign Needed Addendum)
    SUBJECTIVE:   CHIEF COMPLAINT / HPI:   Belly button bleeding: Started six months ago with an odor, then after a couple weeks noticed blood on his t shirt. Treated with cotton ball dressing, it stopped, and then returned a few weeks later. No recent trauma to the area, nothing like this has ever happened before. He did report cleaning the area with soap and water, and some intermittent cleaning with a dry cue tip. Has had hair appear there in the past. No fevers chills chest pain, shortness of breath. No history of abdominal surgeries.   HTN, takes amlodipine 2.5mg , bp typically runs 120/76 GERD- takes protonix, 40mg  Gout- allopurinol 150 mg, 50 years hasn't had a flare in three years, takes tylenol when he does, flares occur in multiple joints.  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   OBJECTIVE:   BP (!) 132/93   Pulse 67   Ht 5\' 7"  (1.702 m)   Wt 205 lb 3.2 oz (93.1 kg)   SpO2 99%   BMI 32.14 kg/m    General: A&O, NAD HEENT: No sign of trauma, EOM grossly intact Cardiac: RRR, no m/r/g Respiratory: CTAB, normal WOB, no w/c/r GI: Soft, NTTP, non-distended.  Small area of irritation at the back of the navel.  No active bleeding, signs of infection. Extremities: NTTP, no peripheral edema. Neuro: Normal gait, moves all four extremities appropriately. Psych: Appropriate mood and affect   ASSESSMENT/PLAN:   Encounter to establish care with new doctor Discussed with the patient has chronic conditions and healthcare needs.  Transferred prescriptions over to our practice so that patient would have  easier time getting refills when needed.  Patient does not currently need any medication refills.  Patient last had routine blood work done in September with his doctors at the [facility].  At that time all labs were WNL.  It was unsure when his last annual wellness visit for Medicare was, will check records and plan to schedule if needed.  Bleeding After careful examination of the bellybutton, no active lesion was identified.  There is an area of irritation which could be the source of his bleeding.  Advised patient to leave the location be for a few weeks to allow it to heal, and encouraged him to use Vaseline on a Q-tip if he needed to explore the area for any reason.  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

## 2023-01-19 NOTE — Assessment & Plan Note (Signed)
Discussed with the patient has chronic conditions and healthcare needs.  Transferred prescriptions over to our practice so that patient would have easier time getting refills when needed.  Patient does not currently need any medication refills.  Patient last had routine blood work done in September with his doctors at the [facility].  At that time all labs were WNL.  It was unsure when his last annual wellness visit for Medicare was, will check records and plan to schedule if needed.

## 2023-01-19 NOTE — Assessment & Plan Note (Signed)
After careful examination of the bellybutton, no active lesion was identified.  There is an area of irritation which could be the source of his bleeding.  Advised patient to leave the location be for a few weeks to allow it to heal, and encouraged him to use Vaseline on a Q-tip if he needed to explore the area for any reason.  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

## 2023-01-19 NOTE — Patient Instructions (Signed)
It was wonderful to see you today!  Today we completed your new patient exam.  We went over all of your chronic medical conditions as well as your medications and have transitioned them over to our system.  We also talked about the bellybutton bleeding you have been experiencing.  This is likely due to some irritation at the back of the bellybutton.  Please avoid irritating the area again and if you do feel as though there is something going on and need to explore with a Q-tip please make sure you use Vaseline on the end to avoid scratching the very delicate skin.  If you continue to have bleeding, we recommend that you discuss this with your team at [facility] as it may be related to your CTCL.  You are doing really well so there is no need for you to come in for a follow-up at this time, however if you need anything before your next wellness check in do not hesitate to reach out.  Please call [phone] with any questions about today's appointment.   If you need any additional refills, please call your pharmacy before calling the office.  Gerrit Heck, DO Family Medicine

## 2023-02-03 ENCOUNTER — Other Ambulatory Visit: Payer: Self-pay | Admitting: Medical Genetics

## 2023-03-02 ENCOUNTER — Encounter: Payer: Self-pay | Admitting: Family Medicine

## 2023-03-02 MED ORDER — PANTOPRAZOLE SODIUM 40 MG PO TBEC: 40.0000 mg | DELAYED_RELEASE_TABLET | Freq: Every day | ORAL | 2 refills | Status: DC

## 2023-03-02 MED ORDER — TAMSULOSIN HCL 0.4 MG PO CAPS: 0.4000 mg | ORAL_CAPSULE | Freq: Every day | ORAL | 3 refills | Status: DC

## 2023-03-04 ENCOUNTER — Encounter: Payer: Self-pay | Admitting: Family Medicine

## 2023-03-06 MED ORDER — TAMSULOSIN HCL 0.4 MG PO CAPS: 0.4000 mg | ORAL_CAPSULE | Freq: Every day | ORAL | 3 refills | Status: DC

## 2023-03-06 NOTE — Telephone Encounter (Signed)
 I called [facility] and they did not receive the prescription for Flomax.   Will resend now.

## 2023-03-10 ENCOUNTER — Other Ambulatory Visit (HOSPITAL_COMMUNITY): Payer: Medicare HMO | Attending: Medical Genetics

## 2023-03-13 ENCOUNTER — Encounter: Payer: Self-pay | Admitting: Family Medicine

## 2023-03-16 NOTE — Progress Notes (Deleted)
    SUBJECTIVE:   CHIEF COMPLAINT / HPI:   Belly button discharge is back  PERTINENT  PMH / PSH: ***  OBJECTIVE:   There were no vitals taken for this visit.  ***  ASSESSMENT/PLAN:   No problem-specific Assessment & Plan notes found for this encounter.     Gerrit Heck, DO [facility]

## 2023-03-17 ENCOUNTER — Ambulatory Visit: Payer: Medicare Other | Admitting: Family Medicine

## 2023-03-24 ENCOUNTER — Ambulatory Visit (INDEPENDENT_AMBULATORY_CARE_PROVIDER_SITE_OTHER): Payer: Medicare HMO | Admitting: Family Medicine

## 2023-03-24 ENCOUNTER — Ambulatory Visit: Payer: Medicare Other | Admitting: Family Medicine

## 2023-03-24 VITALS — BP 132/97 | HR 60 | Ht 68.0 in | Wt 204.4 lb

## 2023-03-24 DIAGNOSIS — R198 Other specified symptoms and signs involving the digestive system and abdomen: Secondary | ICD-10-CM | POA: Diagnosis not present

## 2023-03-24 DIAGNOSIS — Z0000 Encounter for general adult medical examination without abnormal findings: Secondary | ICD-10-CM | POA: Diagnosis not present

## 2023-03-24 MED ORDER — SHINGRIX 50 MCG/0.5ML IM SUSR: INTRAMUSCULAR | 1 refills | Status: DC

## 2023-03-24 NOTE — Progress Notes (Signed)
    SUBJECTIVE:   CHIEF COMPLAINT / HPI:   Umbilical discharge Reports ongoing watery but at times brown/black discharge from his umbilicus.  Foul-smelling and has been ongoing the past few months.  Was seen 01/18/2019 for to establish care and advised to continue monitoring.  Has not had imaging done previously.  Can be associated with some pain deeper within his umbilicus.  Denies fevers and N/V.  PERTINENT  PMH / PSH: HTN, GERD, gout, spinal stenosis, depression  OBJECTIVE:   BP (!) 132/97   Pulse 60   Ht 5\' 8"  (1.727 m)   Wt 204 lb 6.4 oz (92.7 kg)   SpO2 98%   BMI 31.08 kg/m    General: NAD, pleasant, able to participate in exam Abdomen: Large area of black crusting around umbilicus.  Upon deeper palpation with a Q-tip notably yellow/brown foul-smelling fluid present.  No surrounding erythema, edema or fluctuance.  No palpable hernia.  Some pain with deep palpation around umbilicus.  ASSESSMENT/PLAN:   Assessment & Plan Umbilical discharge Ongoing watery to brown/black foul-smelling drainage from umbilicus concerning for possible urachal remnant vs fistulization.  Discussed imaging recommendation with radiology on-call, will start with CT abdomen pelvis with contrast and if clear fistulization may need manual injection of dye into umbilicus to further visualize. -CT abdomen pelvis with contrast, office staff to call patient with scheduling Healthcare maintenance -Signed ROI for [facility] clinic to obtain last colonoscopy -Provided paper Rx for shingles vaccine   Dr. Elberta Fortis, DO [facility]

## 2023-03-24 NOTE — Patient Instructions (Addendum)
It was wonderful to see you today! Thank you for choosing [facility].   Please bring ALL of your medications with you to every visit.   Today we talked about:  We are attempting to get records from the [facility] for when you had your last colonoscopy. I am ordering imaging to further assess the drainage from your umbilicus. Our office will schedule it for you and follow up to discuss further.  I also printed your Shingles vaccine prescription, please have it done at the pharmacy.   Please follow up in 1 month to assess your symptoms  If you haven't already, sign up for My Chart to have easy access to your labs results, and communication with your primary care physician.   We are checking some labs today. If they are abnormal, I will call you. If they are normal, I will send you a MyChart message (if it is active) or a letter in the mail. If you do not hear about your labs in the next 2 weeks, please call the office.  Call the clinic at [phone] if your symptoms worsen or you have any concerns.  Please be sure to schedule follow up at the front desk before you leave today.   Elberta Fortis, DO Family Medicine

## 2023-03-27 ENCOUNTER — Ambulatory Visit (INDEPENDENT_AMBULATORY_CARE_PROVIDER_SITE_OTHER): Payer: Medicare Other

## 2023-03-27 VITALS — Wt 195.0 lb

## 2023-03-27 DIAGNOSIS — Z0000 Encounter for general adult medical examination without abnormal findings: Secondary | ICD-10-CM

## 2023-03-27 NOTE — Progress Notes (Cosign Needed Addendum)
Subjective:   Francisco Cowan is a 73 y.o. male who presents for Medicare Annual/Subsequent preventive examination.  Visit Complete: Virtual I connected with  Henrene Hawking on 03/27/23 by a video and audio enabled telemedicine application and verified that I am speaking with the correct person using two identifiers.  Patient Location: Home  Provider Location: Office/Clinic  I discussed the limitations of evaluation and management by telemedicine. The patient expressed understanding and agreed to proceed.  Vital Signs: Because this visit was a virtual/telehealth visit, some criteria may be missing or patient reported. Any vitals not documented were not able to be obtained and vitals that have been documented are patient reported.  Patient Medicare AWV questionnaire was completed by the patient on 03/23/2023; I have confirmed that all information answered by patient is correct and no changes since this date.  Cardiac Risk Factors include: advanced age (>36men, >76 women);family history of premature cardiovascular disease;male gender;hypertension;sedentary lifestyle     Objective:    Today's Vitals   03/27/23 1045  Weight: 195 lb (88.5 kg)  PainSc: 0-No pain   Body mass index is 29.65 kg/m.     03/27/2023   10:47 AM 12/18/2017   10:11 AM 11/13/2017   11:44 AM  Advanced Directives  Does Patient Have a Medical Advance Directive? Yes Yes Yes  Type of Estate agent of [place];Living will Living will;Healthcare Power of Attorney Living will;Healthcare Power of Attorney  Copy of Healthcare Power of Attorney in Chart? No - copy requested No - copy requested No - copy requested    Current Medications (verified) Outpatient Encounter Medications as of 03/27/2023  Medication Sig   allopurinol (ZYLOPRIM) 300 MG tablet Take 1.5 tablets (450 mg total) by mouth daily.   amLODipine (NORVASC) 2.5 MG tablet Take 1 tablet (2.5 mg total) by mouth daily.   buPROPion  (WELLBUTRIN SR) 150 MG 12 hr tablet Take 1 tablet (150 mg total) by mouth See admin instructions. Taking with the Wellbutrin 300mg  dose for a total of 450mg  daily   buPROPion (WELLBUTRIN XL) 300 MG 24 hr tablet Take 1 tablet (300 mg total) by mouth daily. Taking with Wellbutrin SR 150mg  for 450mg  Total daily.   LORazepam (ATIVAN) 1 MG tablet Take 1 tablet (1 mg total) by mouth every 8 (eight) hours as needed for anxiety or sleep.   Multiple Vitamin (MULTIVITAMIN) capsule Take 1 capsule by mouth daily.   pantoprazole (PROTONIX) 40 MG tablet Take 1 tablet (40 mg total) by mouth daily.   sertraline (ZOLOFT) 50 MG tablet Take 1 tablet (50 mg total) by mouth daily.   tamsulosin (FLOMAX) 0.4 MG CAPS capsule Take 1 capsule (0.4 mg total) by mouth daily.   Zoster Vaccine Adjuvanted [facility]) injection Administer Shingrix vaccination now and repeat in two months   No facility-administered encounter medications on file as of 03/27/2023.    Allergies (verified) Cortisone and Nsaids   History: Past Medical History:  Diagnosis Date   Abnormal ECG 09/19/2017   NSR. Left axis deviation.    Alcohol abuse    Anxiety    Depression    ED (erectile dysfunction)    GERD (gastroesophageal reflux disease)    History of kidney stones    Hypertension    Moderate tetrahydrocannabinol (THC) dependence (HCC)    Photodermatitis    Spinal stenosis    Urge incontinence    Past Surgical History:  Procedure Laterality Date   CARPAL TUNNEL RELEASE Bilateral 1991   COLONOSCOPY  CYST REMOVAL TRUNK N/A 07/2014   back   ESOPHAGOGASTRODUODENOSCOPY     ROTATOR CUFF REPAIR Left 2003/2018   ROTATOR CUFF REPAIR Right 02/10/2014   Study: Echo  06/27/2016   LV size normal.  LV wall thickness normal.  LVEF 55-60%.  Diastolic filling pattern indicates impaired relaxation.  GLS test 17%.  LA is mildly dilated.  Moderate AV sclerosis without stenosis.  Trace MR.  RVSP 22.25 mmHg p   TRANSFORAMINAL LUMBAR INTERBODY  FUSION (TLIF) WITH PEDICLE SCREW FIXATION 1 LEVEL N/A 11/22/2017   Procedure: TRANSFORAMINAL LUMBAR INTERBODY FUSION Lumbar four-five;  Surgeon: Venita Lick, MD;  Location: [facility] OR;  Service: Orthopedics;  Laterality: N/A;  4.5 hrs   ULNAR NERVE TRANSPOSITION Left 2018   Family History  Problem Relation Age of Onset   Bipolar disorder Mother    Arthritis Mother    Depression Mother    Stroke Mother    Angina Father    Hypertension Father    Stroke Father    Lung cancer Brother    COPD Brother    Hearing loss Brother    Colon cancer Paternal Aunt    Bone cancer Paternal Uncle    Social History   Socioeconomic History   Marital status: Married    Spouse name: Not on file   Number of children: 2   Years of education: Not on file   Highest education level: Some college, no degree  Occupational History   Occupation: Radio broadcast assistant  Tobacco Use   Smoking status: Former    Current packs/day: 0.00    Types: Cigarettes    Start date: 1971    Quit date: 2002    Years since quitting: 23.0   Smokeless tobacco: Never  Vaping Use   Vaping status: Never Used  Substance and Sexual Activity   Alcohol use: Yes    Alcohol/week: 28.0 standard drinks of alcohol    Types: 14 Cans of beer, 14 Shots of liquor per week   Drug use: Not Currently    Types: Marijuana, Benzodiazepines   Sexual activity: Yes    Partners: Female  Other Topics Concern   Not on file  Social History Narrative   Marital status/children/pets: married. Lives in [place], [place] 6 months out of the year (usually leaves in November yearly to [place]).   Education/employment: Some college, Radio broadcast assistant.    Daily ETOH and THC use ( on records 02/2017).    Safety:      -smoke alarm in the home:Yes     - wears seatbelt: Yes     - Feels safe in their relationships: Yes   Social Drivers of Corporate investment banker Strain: Low Risk  (03/27/2023)   Overall Financial Resource Strain (CARDIA)     Difficulty of Paying Living Expenses: Not very hard  Food Insecurity: No Food Insecurity (03/27/2023)   Hunger Vital Sign    Worried About Running Out of Food in the Last Year: Never true    Ran Out of Food in the Last Year: Never true  Transportation Needs: No Transportation Needs (03/27/2023)   PRAPARE - Administrator, Civil Service (Medical): No    Lack of Transportation (Non-Medical): No  Physical Activity: Insufficiently Active (03/27/2023)   Exercise Vital Sign    Days of Exercise per Week: 1 day    Minutes of Exercise per Session: 30 min  Stress: No Stress Concern Present (03/27/2023)   [employer] of Occupational Health - Occupational Stress  Questionnaire    Feeling of Stress : Only a little  Social Connections: Socially Isolated (03/27/2023)   Social Connection and Isolation Panel [NHANES]    Frequency of Communication with Friends and Family: Once a week    Frequency of Social Gatherings with Friends and Family: Once a week    Attends Religious Services: Never    Database administrator or Organizations: No    Attends Engineer, structural: Never    Marital Status: Married    Tobacco Counseling Counseling given: Not Answered   Clinical Intake:     Pain Score: 0-No pain        What is the last grade level you completed in school?: Some College         Activities of Daily Living    03/27/2023   10:54 AM 03/23/2023   10:09 AM  In your present state of health, do you have any difficulty performing the following activities:  Hearing? 0 0  Vision? 0 0  Difficulty concentrating or making decisions? 0 0  Walking or climbing stairs? 0 0  Dressing or bathing? 0 0  Doing errands, shopping? 0 0  Preparing Food and eating ? N N  Using the Toilet? N N  In the past six months, have you accidently leaked urine? Y Y  Do you have problems with loss of bowel control? N N  Managing your Medications? N N  Managing your Finances? N N   Housekeeping or managing your Housekeeping? N N    Patient Care Team: Gerrit Heck, DO as PCP - General (Family Medicine) Venita Lick, MD as Consulting Physician (Orthopedic Surgery) Caryn Bee  Indicate any recent Medical Services you may have received from other than [facility] providers in the past year (date may be approximate).     Assessment:   This is a routine wellness examination for Filipe.  Hearing/Vision screen Hearing Screening - Comments:: Denies hearing difficulties. No hearing aids.   Vision Screening - Comments:: Wears rx glasses - up to date with routine eye exams with [place], [place].    Goals Addressed             This Visit's Progress    To stay alive.        Depression Screen    03/27/2023   10:54 AM 03/24/2023   11:39 AM 10/15/2020    2:43 PM 10/08/2018   10:32 AM 10/08/2018   10:31 AM 12/18/2017   10:11 AM 10/11/2017    2:03 PM  PHQ 2/9 Scores  PHQ - 2 Score 0 0 0 0 0 0 0  PHQ- 9 Score 2 2 0 0  2 0    Fall Risk    03/27/2023   10:53 AM 03/23/2023   10:09 AM 10/15/2020    2:43 PM 12/18/2017   10:11 AM 10/11/2017    1:49 PM  Fall Risk   Falls in the past year? 1 1 0 No No  Number falls in past yr: 1 1 0    Injury with Fall? 0 0 0    Risk for fall due to : History of fall(s)  No Fall Risks    Follow up Education provided;Falls prevention discussed;Falls evaluation completed  Falls evaluation completed      MEDICARE RISK AT HOME: Medicare Risk at Home Any stairs in or around the home?: Yes If so, are there any without handrails?: No Home free of loose throw rugs in walkways, pet beds, electrical cords, etc?:  Yes Adequate lighting in your home to reduce risk of falls?: Yes Life alert?: No Use of a cane, walker or w/c?: No Grab bars in the bathroom?: Yes Shower chair or bench in shower?: Yes Elevated toilet seat or a handicapped toilet?: No  TIMED UP AND GO:  Was the test performed?  No    Cognitive Function:    03/27/2023    10:54 AM 12/18/2017   10:13 AM  MMSE - Mini Mental State Exam  Not completed: Unable to complete   Orientation to time  5  Orientation to Place  5  Registration  3  Attention/ Calculation  5  Recall  3  Language- name 2 objects  2  Language- repeat  1  Language- follow 3 step command  3  Language- read & follow direction  1  Write a sentence  1  Copy design  1  Total score  30        03/27/2023   10:55 AM  6CIT Screen  What Year? 0 points  What month? 0 points  What time? 0 points  Count back from 20 0 points  Months in reverse 0 points  Repeat phrase 0 points  Total Score 0 points    Immunizations Immunization History  Administered Date(s) Administered   Influenza Split 11/28/2007, 02/04/2009, 01/15/2011, 12/26/2011, 01/22/2013   Influenza, High Dose Seasonal PF 12/18/2017, 11/29/2018, 03/20/2020   Influenza,inj,Quad PF,6+ Mos 11/18/2016   Influenza,inj,quad, With Preservative 11/28/2007, 02/04/2009, 01/15/2011, 12/26/2011, 01/22/2013, 03/31/2017   Influenza-Unspecified 12/23/2022   PFIZER(Purple Top)SARS-COV-2 Vaccination 04/29/2019, 05/20/2019, 02/12/2020   Pneumococcal Conjugate-13 12/18/2017   Pneumococcal Polysaccharide-23 11/18/2016   Pneumococcal-Unspecified 04/28/2016   Tdap 03/13/2013, 09/30/2015   Zoster, Live 07/03/2011    TDAP status: Up to date  Flu Vaccine status: Up to date  Pneumococcal vaccine status: Up to date  Covid-19 vaccine status: Completed vaccines  Qualifies for Shingles Vaccine? Yes   Zostavax completed Yes   Shingrix Completed?: No.    Education has been provided regarding the importance of this vaccine. Patient has been advised to call insurance company to determine out of pocket expense if they have not yet received this vaccine. Advised may also receive vaccine at local pharmacy or Health Dept. Verbalized acceptance and understanding.  Screening Tests Health Maintenance  Topic Date Due   Zoster Vaccines- Shingrix (1 of 2)  05/08/1969   Colonoscopy  09/08/2021   COVID-19 Vaccine (4 - 2024-25 season) 02/04/2024 (Originally 10/30/2022)   Medicare Annual Wellness (AWV)  03/26/2024   DTaP/Tdap/Td (3 - Td or Tdap) 09/29/2025   Pneumonia Vaccine 61+ Years old  Completed   INFLUENZA VACCINE  Completed   HPV VACCINES  Aged Out   Hepatitis C Screening  Discontinued    Health Maintenance  Health Maintenance Due  Topic Date Due   Zoster Vaccines- Shingrix (1 of 2) [birth year]1970   Colonoscopy  09/08/2021    Colorectal cancer screening: Type of screening: Colonoscopy. Completed 09/09/2011. Repeat every 10 years-Patient is overdue.  Lung Cancer Screening: (Low Dose CT Chest recommended if Age 52-80 years, 20 pack-year currently smoking OR have quit w/in 15years.) does not qualify.   Lung Cancer Screening Referral: no  Additional Screening:  Hepatitis C Screening: does qualify; Completed: no  Vision Screening: Recommended annual ophthalmology exams for early detection of glaucoma and other disorders of the eye. Is the patient up to date with their annual eye exam?  Yes  Who is the provider or what is the name of the office  in which the patient attends annual eye exams? [place], [place] If pt is not established with a provider, would they like to be referred to a provider to establish care? No .   Dental Screening: Recommended annual dental exams for proper oral hygiene  Community Resource Referral / Chronic Care Management: CRR required this visit?  No   CCM required this visit?  No     Plan:     I have personally reviewed and noted the following in the patient's chart:   Medical and social history Use of alcohol, tobacco or illicit drugs  Current medications and supplements including opioid prescriptions. Patient is not currently taking opioid prescriptions. Functional ability and status Nutritional status Physical activity Advanced directives List of other physicians Hospitalizations, surgeries, and  ER visits in previous 12 months Vitals Screenings to include cognitive, depression, and falls Referrals and appointments  In addition, I have reviewed and discussed with patient certain preventive protocols, quality metrics, and best practice recommendations. A written personalized care plan for preventive services as well as general preventive health recommendations were provided to patient.     Mickeal Needy, LPN   06/06/8117   After Visit Summary: (MyChart) Due to this being a telephonic visit, the after visit summary with patients personalized plan was offered to patient via MyChart   Nurse Notes: Please see routing comments.

## 2023-03-30 ENCOUNTER — Ambulatory Visit
Admission: RE | Admit: 2023-03-30 | Discharge: 2023-03-30 | Disposition: A | Discharge status: Home / Self Care | Payer: Medicare HMO | Source: Ambulatory Visit | Attending: Family Medicine | Admitting: Family Medicine

## 2023-03-30 DIAGNOSIS — R198 Other specified symptoms and signs involving the digestive system and abdomen: Secondary | ICD-10-CM

## 2023-03-30 MED ORDER — IOPAMIDOL (ISOVUE-300) INJECTION 61%
100.0000 mL | Freq: Once | INTRAVENOUS | Status: AC | PRN
  Administered 2023-03-30: 100 mL via INTRAVENOUS

## 2023-04-09 ENCOUNTER — Encounter: Payer: Self-pay | Admitting: Family Medicine

## 2023-04-12 ENCOUNTER — Encounter: Payer: Self-pay | Admitting: Family Medicine

## 2023-04-17 MED ORDER — PANTOPRAZOLE SODIUM 40 MG PO TBEC: 40.0000 mg | DELAYED_RELEASE_TABLET | Freq: Every day | ORAL | Status: DC

## 2023-04-18 ENCOUNTER — Encounter: Payer: Self-pay | Admitting: Diagnostic Neuroimaging

## 2023-04-18 ENCOUNTER — Ambulatory Visit: Payer: Medicare HMO | Admitting: Diagnostic Neuroimaging

## 2023-04-18 VITALS — BP 140/76 | HR 64 | Ht 68.0 in | Wt 204.2 lb

## 2023-04-18 DIAGNOSIS — G629 Polyneuropathy, unspecified: Secondary | ICD-10-CM | POA: Diagnosis not present

## 2023-04-18 DIAGNOSIS — R531 Weakness: Secondary | ICD-10-CM | POA: Diagnosis not present

## 2023-04-18 NOTE — Progress Notes (Signed)
[facility]  PATIENT: Francisco Cowan DOB: 01-May-1950  REFERRING CLINICIAN: Arlyce Harman, MD HISTORY FROM: patient  REASON FOR VISIT: new consult   HISTORICAL  CHIEF COMPLAINT:  Chief Complaint  Patient presents with   New Patient (Initial Visit)    Pt in room 7. Alone. New patient paper referral for frequent falls, weakness in left leg. Pt said last fall was 2 months ago. Pt report no dizziness, leg weakness for a couple of years now. No pain in legs, had numbness/tingling in left leg.     HISTORY OF PRESENT ILLNESS:   73 year old male here for evaluation of frequent falls.  Around 2022 patient had episodes of falling down.  Sometimes he would be standing at home and all of a sudden would fall.  Sometimes he would feel like his left leg would give out.  Left leg weakness could last seconds or minutes at a time.  2018 patient was having increasing low back pain rating to the right leg.  He underwent L4-5 decompression and fusion in September 2019 with improvement in right leg symptoms.  Patient has had some problems with left arm numbness.  He was diagnosed with left ulnar neuropathy and treated with ulnar transposition surgery.  Having chronic neck pain issues and pain radiating into his arms.   REVIEW OF SYSTEMS: Full 14 system review of systems performed and negative with exception of: as per HPI.  ALLERGIES: Allergies  Allergen Reactions   Cortisone     Pt stated, "I am only allergic to cortisone injections"   Nsaids Other (See Comments)    GI Bleeding    HOME MEDICATIONS: Outpatient Medications Prior to Visit  Medication Sig Dispense Refill   allopurinol (ZYLOPRIM) 300 MG tablet Take 1.5 tablets (450 mg total) by mouth daily.     ammonium lactate (LAC-HYDRIN) 12 % lotion Apply 1 Application topically.     atenolol (TENORMIN) 25 MG tablet Take 1 tablet by mouth daily.     bisoprolol (ZEBETA) 5 MG tablet Take 5 mg by mouth daily.      buPROPion (WELLBUTRIN SR) 150 MG 12 hr tablet Take 1 tablet (150 mg total) by mouth See admin instructions. Taking with the Wellbutrin 300mg  dose for a total of 450mg  daily     buPROPion (WELLBUTRIN XL) 300 MG 24 hr tablet Take 1 tablet (300 mg total) by mouth daily. Taking with Wellbutrin SR 150mg  for 450mg  Total daily.     clobetasol cream (TEMOVATE) 0.05 % APPLY TOPICALLY TO INVOLVED AREAS ONE TIME DAILY FOR 2 WEEKS, THEN FOR 1 WEEK OFF, AND THEN REPEAT     lisinopril (ZESTRIL) 10 MG tablet Take 1 tablet by mouth daily.     LORazepam (ATIVAN) 1 MG tablet Take 1 tablet (1 mg total) by mouth every 8 (eight) hours as needed for anxiety or sleep.     Multiple Vitamin (MULTIVITAMIN) capsule Take 1 capsule by mouth daily.     pantoprazole (PROTONIX) 40 MG tablet Take 1 tablet (40 mg total) by mouth daily. (Patient taking differently: Take 40 mg by mouth 2 (two) times daily.)     sertraline (ZOLOFT) 50 MG tablet Take 1 tablet (50 mg total) by mouth daily.     sildenafil (VIAGRA) 100 MG tablet TAKE 1 TABLET BY MOUTH AS NEEDED. TAKE 30-60 MINUTES BEFORE SEXUAL INTERCOURSE.     tamsulosin (FLOMAX) 0.4 MG CAPS capsule Take 1 capsule (0.4 mg total) by mouth daily. (Patient taking differently: Take 0.4 mg by mouth  2 (two) times daily.) 30 capsule 3   traMADol (ULTRAM) 50 MG tablet Take 1 tablet by mouth every 6 (six) hours as needed.     traZODone (DESYREL) 100 MG tablet Take 100-200 mg by mouth at bedtime as needed.     triamcinolone (KENALOG) 0.025 % cream APPLY TWICE DAILY AS NEEDED IN AFFECTED AREAS FOR ITCHYNESS.     Zoster Vaccine Adjuvanted [facility]) injection Administer Shingrix vaccination now and repeat in two months 1 each 1   amLODipine (NORVASC) 2.5 MG tablet Take 1 tablet (2.5 mg total) by mouth daily.     No facility-administered medications prior to visit.    PAST MEDICAL HISTORY: Past Medical History:  Diagnosis Date   Abnormal ECG 09/19/2017   NSR. Left axis deviation.    Alcohol  abuse    Anxiety    Depression    ED (erectile dysfunction)    GERD (gastroesophageal reflux disease)    History of kidney stones    Hypertension    Moderate tetrahydrocannabinol (THC) dependence (HCC)    Photodermatitis    Spinal stenosis    Urge incontinence     PAST SURGICAL HISTORY: Past Surgical History:  Procedure Laterality Date   CARPAL TUNNEL RELEASE Bilateral 1991   COLONOSCOPY     CYST REMOVAL TRUNK N/A 07/2014   back   ESOPHAGOGASTRODUODENOSCOPY     ROTATOR CUFF REPAIR Left 2003/2018   ROTATOR CUFF REPAIR Right 02/10/2014   Study: Echo  06/27/2016   LV size normal.  LV wall thickness normal.  LVEF 55-60%.  Diastolic filling pattern indicates impaired relaxation.  GLS test 17%.  LA is mildly dilated.  Moderate AV sclerosis without stenosis.  Trace MR.  RVSP 22.25 mmHg p   TRANSFORAMINAL LUMBAR INTERBODY FUSION (TLIF) WITH PEDICLE SCREW FIXATION 1 LEVEL N/A 11/22/2017   Procedure: TRANSFORAMINAL LUMBAR INTERBODY FUSION Lumbar four-five;  Surgeon: Venita Lick, MD;  Location: [facility] OR;  Service: Orthopedics;  Laterality: N/A;  4.5 hrs   ULNAR NERVE TRANSPOSITION Left 2018    FAMILY HISTORY: Family History  Problem Relation Age of Onset   Bipolar disorder Mother    Arthritis Mother    Depression Mother    Stroke Mother    Angina Father    Hypertension Father    Stroke Father    Lung cancer Brother    COPD Brother    Hearing loss Brother    Colon cancer Paternal Aunt    Bone cancer Paternal Uncle     SOCIAL HISTORY: Social History   Socioeconomic History   Marital status: Married    Spouse name: Not on file   Number of children: 2   Years of education: Not on file   Highest education level: Some college, no degree  Occupational History   Occupation: Radio broadcast assistant  Tobacco Use   Smoking status: Former    Current packs/day: 0.00    Types: Cigarettes    Start date: 1971    Quit date: 2002    Years since quitting: 23.1   Smokeless tobacco:  Never  Vaping Use   Vaping status: Never Used  Substance and Sexual Activity   Alcohol use: Not Currently    Comment: drinks 2 cans of beer daily   Drug use: Not Currently    Types: Marijuana, Benzodiazepines    Comment: smokes marijuana daily   Sexual activity: Yes    Partners: Female  Other Topics Concern   Not on file  Social History Narrative   Marital status/children/pets:  married. Lives in [place], [place] 6 months out of the year (usually leaves in November yearly to [place]).   Education/employment: Some college, Radio broadcast assistant.    Daily ETOH and THC use ( on records 02/2017).    Safety:      -smoke alarm in the home:Yes     - wears seatbelt: Yes     - Feels safe in their relationships: Yes               Right handed   Wear glasses    Drink 1-2 cups per day       Social Drivers of Health   Financial Resource Strain: Low Risk  (03/27/2023)   Overall Financial Resource Strain (CARDIA)    Difficulty of Paying Living Expenses: Not very hard  Food Insecurity: No Food Insecurity (03/27/2023)   Hunger Vital Sign    Worried About Running Out of Food in the Last Year: Never true    Ran Out of Food in the Last Year: Never true  Transportation Needs: No Transportation Needs (03/27/2023)   PRAPARE - Administrator, Civil Service (Medical): No    Lack of Transportation (Non-Medical): No  Physical Activity: Insufficiently Active (03/27/2023)   Exercise Vital Sign    Days of Exercise per Week: 1 day    Minutes of Exercise per Session: 30 min  Stress: No Stress Concern Present (03/27/2023)   [employer] of Occupational Health - Occupational Stress Questionnaire    Feeling of Stress : Only a little  Social Connections: Socially Isolated (03/27/2023)   Social Connection and Isolation Panel [NHANES]    Frequency of Communication with Friends and Family: Once a week    Frequency of Social Gatherings with Friends and Family: Once a week    Attends Religious  Services: Never    Database administrator or Organizations: No    Attends Banker Meetings: Never    Marital Status: Married  Catering manager Violence: Not At Risk (03/27/2023)   Humiliation, Afraid, Rape, and Kick questionnaire    Fear of Current or Ex-Partner: No    Emotionally Abused: No    Physically Abused: No    Sexually Abused: No     PHYSICAL EXAM  GENERAL EXAM/CONSTITUTIONAL: Vitals:  Vitals:   04/18/23 1055 04/18/23 1103  BP: (!) 159/86 (!) 140/76  Pulse: 64   Weight: 204 lb 3.2 oz (92.6 kg)   Height: 5\' 8"  (1.727 m)    Body mass index is 31.05 kg/m. Wt Readings from Last 3 Encounters:  04/18/23 204 lb 3.2 oz (92.6 kg)  03/27/23 195 lb (88.5 kg)  03/24/23 204 lb 6.4 oz (92.7 kg)   Patient is in no distress; well developed, nourished and groomed; neck is supple  CARDIOVASCULAR: Examination of carotid arteries is normal; no carotid bruits Regular rate and rhythm, no murmurs Examination of peripheral vascular system by observation and palpation is normal  EYES: Ophthalmoscopic exam of optic discs and posterior segments is normal; no papilledema or hemorrhages No results found.  MUSCULOSKELETAL: Gait, strength, tone, movements noted in Neurologic exam below  NEUROLOGIC: MENTAL STATUS:     03/27/2023   10:54 AM 12/18/2017   10:13 AM  MMSE - Mini Mental State Exam  Not completed: Unable to complete   Orientation to time  5  Orientation to Place  5  Registration  3  Attention/ Calculation  5  Recall  3  Language- name 2 objects  2  Language- repeat  1  Language- follow 3 step command  3  Language- read & follow direction  1  Write a sentence  1  Copy design  1  Total score  30   awake, alert, oriented to person, place and time recent and remote memory intact normal attention and concentration language fluent, comprehension intact, naming intact fund of knowledge appropriate  CRANIAL NERVE:  2nd - no papilledema on fundoscopic  exam 2nd, 3rd, 4th, 6th - pupils equal and reactive to light, visual fields full to confrontation, extraocular muscles intact, no nystagmus 5th - facial sensation symmetric 7th - facial strength symmetric 8th - hearing intact 9th - palate elevates symmetrically, uvula midline 11th - shoulder shrug symmetric 12th - tongue protrusion midline  MOTOR:  ATROPHY AND WEAKNESS IN BILATERAL HAND INTRINSIC MUSCLES (3-4) OTHERWISE 5/6, EXCEPT RIGHT FOOT DF 4+, LEFT FOOT DF 4  SENSORY:  normal and symmetric to light touch, temperature, vibration  COORDINATION:  finger-nose-finger, fine finger movements normal  REFLEXES:  deep tendon reflexes 1+ and symmetric  GAIT/STATION:  WIDE GAIT; LEFT LEG CIRCUMDUCTION; MILD FOOT DROP GAIT ON LEFT     DIAGNOSTIC DATA (LABS, IMAGING, TESTING) - I reviewed patient records, labs, notes, testing and imaging myself where available.  Lab Results  Component Value Date   WBC 7.4 11/13/2017   HGB 15.0 11/13/2017   HCT 45.4 11/13/2017   MCV 97.6 11/13/2017   PLT 181 11/13/2017      Component Value Date/Time   NA 135 08/09/2019 1530   K 4.4 08/09/2019 1530   CL 100 08/09/2019 1530   CO2 25 08/09/2019 1530   GLUCOSE 133 (H) 08/09/2019 1530   BUN 20 08/09/2019 1530   CREATININE 0.98 08/09/2019 1530   CALCIUM 10.0 08/09/2019 1530   PROT 6.6 11/13/2017 1200   ALBUMIN 3.9 11/13/2017 1200   AST 26 11/13/2017 1200   ALT 21 11/13/2017 1200   ALKPHOS 49 11/13/2017 1200   BILITOT 1.1 11/13/2017 1200   GFRNONAA >60 11/13/2017 1200   GFRAA >60 11/13/2017 1200   No results found for: "CHOL", "HDL", "LDLCALC", "LDLDIRECT", "TRIG", "CHOLHDL" Lab Results  Component Value Date   HGBA1C 5.1 10/11/2017   No results found for: "VITAMINB12" No results found for: "TSH"     ASSESSMENT AND PLAN  73 y.o. year old male here with:   Dx:  1. Neuropathy   2. Weakness      PLAN:  INTERMITTENT FALLS (LEFT LEG WEAKNESS; h/o lumbar radiculopathy on  right with decompression and fusion in 2019; also neck pain; bilateral hand weakness) - check MRI cervical and lumbar spine (ordered by orthopedic clinic) - check EMG/NCS - check neuropathy labs  Orders Placed This Encounter  Procedures   Hemoglobin A1c   Vitamin B12   AChR Abs with Reflex to MuSK   NCV with EMG(electromyography)   Return for for NCV/EMG.    Suanne Marker, MD 04/18/2023, 12:18 PM Certified in Neurology, Neurophysiology and Neuroimaging  [facility] [address] [place], [place] [address] [phone]

## 2023-04-28 LAB — HEMOGLOBIN A1C
Est. average glucose Bld gHb Est-mCnc: 103 mg/dL
Hgb A1c MFr Bld: 5.2 % (ref 4.8–5.6)

## 2023-04-28 LAB — VITAMIN B12: Vitamin B-12: >2000 pg/mL — ABNORMAL HIGH (ref 232–1245)

## 2023-04-28 LAB — ACHR ABS WITH REFLEX TO MUSK: AChR Binding Ab, Serum: <0.03 nmol/L (ref 0.00–0.24)

## 2023-04-28 LAB — MUSK ANTIBODIES: MuSK Antibodies: <1 U/mL

## 2023-05-06 ENCOUNTER — Encounter: Payer: Self-pay | Admitting: Family Medicine

## 2023-05-06 DIAGNOSIS — N400 Enlarged prostate without lower urinary tract symptoms: Secondary | ICD-10-CM

## 2023-05-06 DIAGNOSIS — K219 Gastro-esophageal reflux disease without esophagitis: Secondary | ICD-10-CM

## 2023-05-08 MED ORDER — TAMSULOSIN HCL 0.4 MG PO CAPS: 0.4000 mg | ORAL_CAPSULE | Freq: Two times a day (BID) | ORAL | 2 refills | Status: DC

## 2023-05-08 MED ORDER — PANTOPRAZOLE SODIUM 40 MG PO TBEC: 40.0000 mg | DELAYED_RELEASE_TABLET | Freq: Every day | ORAL | 3 refills | Status: AC

## 2023-05-11 ENCOUNTER — Encounter: Payer: Medicare HMO | Admitting: Diagnostic Neuroimaging

## 2023-05-18 DIAGNOSIS — H524 Presbyopia: Secondary | ICD-10-CM | POA: Diagnosis not present

## 2023-05-18 DIAGNOSIS — H5203 Hypermetropia, bilateral: Secondary | ICD-10-CM | POA: Diagnosis not present

## 2023-05-18 DIAGNOSIS — H40013 Open angle with borderline findings, low risk, bilateral: Secondary | ICD-10-CM | POA: Diagnosis not present

## 2023-05-18 DIAGNOSIS — H2513 Age-related nuclear cataract, bilateral: Secondary | ICD-10-CM | POA: Diagnosis not present

## 2023-05-18 DIAGNOSIS — H01024 Squamous blepharitis left upper eyelid: Secondary | ICD-10-CM | POA: Diagnosis not present

## 2023-05-18 DIAGNOSIS — H3562 Retinal hemorrhage, left eye: Secondary | ICD-10-CM | POA: Diagnosis not present

## 2023-05-18 DIAGNOSIS — H52223 Regular astigmatism, bilateral: Secondary | ICD-10-CM | POA: Diagnosis not present

## 2023-05-18 DIAGNOSIS — H01021 Squamous blepharitis right upper eyelid: Secondary | ICD-10-CM | POA: Diagnosis not present

## 2023-05-19 DIAGNOSIS — Z0100 Encounter for examination of eyes and vision without abnormal findings: Secondary | ICD-10-CM | POA: Diagnosis not present

## 2023-05-25 ENCOUNTER — Encounter: Payer: Self-pay | Admitting: Family Medicine

## 2023-05-25 MED ORDER — BUPROPION HCL ER (XL) 300 MG PO TB24: 300.0000 mg | ORAL_TABLET | Freq: Every day | ORAL | 0 refills | Status: DC

## 2023-06-01 ENCOUNTER — Encounter: Admitting: Diagnostic Neuroimaging

## 2023-06-01 ENCOUNTER — Telehealth: Payer: Self-pay | Admitting: Diagnostic Neuroimaging

## 2023-06-01 NOTE — Telephone Encounter (Signed)
 Called to reschedule 06/01/23 NCV/EMG due to Dr. Marjory Lies not being ready to do this test. Patient stated that he cannot wait until the first available appointment of 06/29/23 because he is wanting to know the reason why he can't walk. Is it possible that the patient could see Dr. Terrace Arabia or Dr. Lucia Gaskins for this test?

## 2023-06-01 NOTE — Telephone Encounter (Addendum)
 Yes, Ok to put patient on my EMG/NCS schedule.  Do you have his MRI report yet?  If not, Stanton Kidney would you please get it?  PLAN:   INTERMITTENT FALLS (LEFT LEG WEAKNESS; h/o lumbar radiculopathy on right with decompression and fusion in 2019; also neck pain; bilateral hand weakness) - check MRI cervical and lumbar spine (ordered by orthopedic clinic) - check EMG/NCS - check neuropathy labs      Orders Placed This Encounter  Procedures   Hemoglobin A1c   Vitamin B12   AChR Abs with Reflex to MuSK   NCV with EMG(electromyography)    Return for for NCV/EMG.

## 2023-06-01 NOTE — Telephone Encounter (Signed)
 Dr. Terrace Arabia or Dr. Lucia Gaskins- are you willing to complete EMG/NCS on this pt? If so, anywhere we can work them into your schedule?

## 2023-06-02 NOTE — Telephone Encounter (Signed)
 Patient scheduled with Dr. Terrace Arabia for NCV/EMG for 06/07/23 at 10:30am

## 2023-06-02 NOTE — Telephone Encounter (Signed)
 Noted.

## 2023-06-03 ENCOUNTER — Other Ambulatory Visit: Payer: Self-pay | Admitting: Family Medicine

## 2023-06-03 DIAGNOSIS — N400 Enlarged prostate without lower urinary tract symptoms: Secondary | ICD-10-CM

## 2023-06-06 DIAGNOSIS — M50323 Other cervical disc degeneration at C6-C7 level: Secondary | ICD-10-CM | POA: Diagnosis not present

## 2023-06-06 DIAGNOSIS — M47812 Spondylosis without myelopathy or radiculopathy, cervical region: Secondary | ICD-10-CM | POA: Diagnosis not present

## 2023-06-06 DIAGNOSIS — M5033 Other cervical disc degeneration, cervicothoracic region: Secondary | ICD-10-CM | POA: Diagnosis not present

## 2023-06-06 DIAGNOSIS — M5451 Vertebrogenic low back pain: Secondary | ICD-10-CM | POA: Diagnosis not present

## 2023-06-06 DIAGNOSIS — M50322 Other cervical disc degeneration at C5-C6 level: Secondary | ICD-10-CM | POA: Diagnosis not present

## 2023-06-07 ENCOUNTER — Encounter: Payer: Self-pay | Admitting: Neurology

## 2023-06-07 ENCOUNTER — Ambulatory Visit: Admitting: Neurology

## 2023-06-07 VITALS — BP 142/76 | HR 57 | Resp 15

## 2023-06-07 DIAGNOSIS — W19XXXD Unspecified fall, subsequent encounter: Secondary | ICD-10-CM

## 2023-06-07 DIAGNOSIS — W19XXXA Unspecified fall, initial encounter: Secondary | ICD-10-CM | POA: Insufficient documentation

## 2023-06-07 DIAGNOSIS — G629 Polyneuropathy, unspecified: Secondary | ICD-10-CM | POA: Diagnosis not present

## 2023-06-07 DIAGNOSIS — R531 Weakness: Secondary | ICD-10-CM | POA: Insufficient documentation

## 2023-06-07 NOTE — Procedures (Signed)
 Full Name: Francisco Cowan Gender: Male MRN #: [id number] Date of Birth: 1950/05/18    Visit Date: 06/07/2023 11:04 Age: 73 Years Examining Physician: Levert Feinstein Referring Physician: Dr. Joselyn Glassman Height: 5 feet 8 inch History: 73 year old male with history of lumbar decompression for right lumbar radiculopathy, bilateral carpal tunnel release surgery and a left ulnar transposition of surgery presenting with intermittent bilateral feet paresthesia, unsteady gait, neck pain radiating pain to left upper extremity  Summary of the test:  Nerve conduction study: Right sural, superficial peroneal sensory responses were absent.  Right tibial motor responses were absent.  Right peroneal to EDB motor response showed mildly decreased conduction velocity, with well-preserved CMAP amplitude.  Bilateral ulnar sensory responses were absent.  Bilateral median sensory responses showed moderately prolonged peak latency with moderately decreased snap amplitude.  Bilateral ulnar motor responses showed mild to moderately prolonged distal latency, with mild to moderately decreased CMAP amplitude, significant velocity drop across bilateral elbow.  Bilateral median motor responses showed significantly prolonged distal latency, with significantly decreased CMAP amplitude on the right side, mild on the left side.  Electromyography: Selected needle examinations were performed of the right lower extremity muscles, right lumbosacral paraspinal; bilateral upper extremity muscles and bilateral cervical paraspinal muscles.  There was evidence of chronic neuropathic changes involving bilateral T1, C8, myotomes.  There was also evidence of increased insertional activity and neuropathic motor unit potential at bilateral lower cervical paraspinals.  There was mild chronic neuropathic changes involving right L4-5 myotomes.  Conclusion: This is an abnormal study.  There is electrodiagnostic evidence of  moderate length-dependent axonal sensorimotor polyneuropathy, with superimposed mild right L4-5 lumbar radiculopathy.  There is also evidence of chronic bilateral C8-T1 cervical radiculopathy, with superimposed bilateral distal median neuropathy, and ulnar neuropathy across the elbow.    Levert Feinstein. M.D. Ph.D.   [facility] [address] [place], [place] [address] Tel: [phone] Fax: [phone]  Verbal informed consent was obtained from the patient, patient was informed of potential risk of procedure, including bruising, bleeding, hematoma formation, infection, muscle weakness, muscle pain, numbness, among others.        MNC    Nerve / Sites Muscle Latency Ref. Amplitude Ref. Rel Amp Segments Distance Velocity Ref. Area    ms ms mV mV %  cm m/s m/s mVms  R Median - APB     Wrist APB 6.1 <=4.4 1.5 >=4.0 100 Wrist - APB 7   3.8     Upper arm APB 10.9  1.3  82.4 Upper arm - Wrist 21 44 >=49 3.1  L Median - APB     Wrist APB 5.6 <=4.4 3.4 >=4.0 100 Wrist - APB 7   10.1     Upper arm APB 10.1  2.9  87 Upper arm - Wrist 22 48 >=49 9.0  R Ulnar - ADM     Wrist ADM 3.9 <=3.3 2.9 >=6.0 100 Wrist - ADM 7   10.5     B.Elbow ADM 7.1  3.1  106 B.Elbow - Wrist 16 51 >=49 11.0     A.Elbow ADM 11.4  3.8  123 A.Elbow - B.Elbow 17 40 >=49 13.5  L Ulnar - ADM     Wrist ADM 4.7 <=3.3 4.2 >=6.0 100 Wrist - ADM 9   12.7     B.Elbow ADM 7.0  3.9  92.9 B.Elbow - Wrist 14 59 >=49 11.6     A.Elbow ADM  11.2  4.5  118 A.Elbow - B.Elbow 15 36 >=49 14.2  R Peroneal - EDB     Ankle EDB 6.1 <=6.5 5.0 >=2.0 100 Ankle - EDB 9   16.3     Fib head EDB 14.8  4.6  91.5 Fib head - Ankle 26 30 >=44 15.6     Pop fossa EDB 19.1  3.8  83.1 Pop fossa - Fib head 17 40 >=44 14.5         Pop fossa - Ankle      R Tibial - AH     Ankle AH NR <=5.8 NR >=4.0 NR Ankle - AH 9   NR     Pop fossa AH      Pop fossa - Ankle   >=41                  SNC    Nerve / Sites Rec. Site Peak Lat Ref.  Amp  Ref. Segments Distance    ms ms V V  cm  R Sural - Ankle (Calf)     Calf Ankle NR <=4.4 NR >=6 Calf - Ankle 14  R Superficial peroneal - Ankle     Lat leg Ankle NR <=4.4 NR >=6 Lat leg - Ankle 14  R Median - Orthodromic (Dig II, Mid palm)     Dig II Wrist 4.1 <=3.4 5 >=10 Dig II - Wrist 13  L Median - Orthodromic (Dig II, Mid palm)     Dig II Wrist 4.1 <=3.4 5 >=10 Dig II - Wrist 15  R Ulnar - Orthodromic, (Dig V, Mid palm)     Dig V Wrist NR <=3.1 NR >=5 Dig V - Wrist 11  L Ulnar - Orthodromic, (Dig V, Mid palm)     Dig V Wrist NR <=3.1 NR >=5 Dig V - Wrist 65                 F  Wave    Nerve F Lat Ref.   ms ms  R Ulnar - ADM 46.9 <=32.0  L Ulnar - ADM 36.8 <=32.0         EMG Summary Table    Spontaneous MUAP Recruitment  Muscle IA Fib PSW Fasc Other Amp Dur. Poly Pattern  R. Tibialis anterior Increased None None None _______ Increased Increased 1+ Reduced  R. Tibialis posterior Normal None None None _______ Normal Normal Normal Normal  R. Peroneus longus Increased None None None _______ Increased Increased 1+ Reduced  R. Gastrocnemius (Medial head) Normal None None None _______ Normal Normal Normal Normal  R. Vastus lateralis Normal None None None _______ Normal Normal Normal Normal  R. Lumbar paraspinals (low) Normal None None None _______ Normal Normal Normal Normal  R. Lumbar paraspinals (mid) Normal None None None _______ Normal Normal Normal Normal  R. Cervical paraspinals Increased None None None _______ Increased Increased 1+ Normal  L. Cervical paraspinals Increased None None None _______ Increased Increased 1+ Normal  R. Abductor pollicis brevis Increased None None None _______ Normal Normal Normal Reduced  R. Abductor digiti minimi (manus) Increased None None None _______ Normal Normal Normal Reduced  R. Extensor digitorum communis Increased None None None _______ Increased Increased 1+ Reduced  R. Pronator teres Normal None None None _______ Normal Normal Normal  Normal  R. Biceps brachii Normal None None None _______ Normal Normal Normal Normal  R. Deltoid Normal None None None _______ Normal Normal Normal Normal  R. Triceps brachii Normal None  None None _______ Normal Normal Normal Normal  L. First dorsal interosseous Increased None None None _______ Increased Increased 1+ Reduced  L. Abductor digiti minimi (manus) Increased None None None _______ Normal Normal Normal Reduced  L. Abductor pollicis brevis Normal None None None _______ Normal Normal Normal Reduced  L. Pronator teres Normal None None None _______ Normal Normal Normal Reduced  L. Biceps brachii Normal None None None _______ Normal Normal Normal Normal  L. Deltoid Normal None None None _______ Normal Normal Normal Normal  L. Triceps brachii Normal None None None _______ Normal Normal Normal Normal  L. Extensor digitorum communis Increased 1+ None None _______ Normal Normal Normal Reduced

## 2023-06-07 NOTE — Progress Notes (Signed)
 Chief Complaint  Patient presents with   NERVE CONDUCTION STUDY    Emgrm4, alone, NERVE CONDUCTION STUDY referral from penumalli md: pt reports left leg tingly and numbness primarily but occasional right leg as well. Also has numbness and tingly in left arm.  Reports falls recently (falls screening completed).       ASSESSMENT AND PLAN  Francisco Cowan is a 73 y.o. 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   DIAGNOSTIC DATA (LABS, IMAGING, TESTING) - I reviewed patient records, labs, notes, testing and imaging myself where available. MRI of cervical spine from [facility] clinic April 17, 2020, multilevel degenerative changes, mild canal stenosis C6-7, AP diameter of 9 mm, C6-7 moderate foraminal on the right, C5-6, the AP diameter of 10 mm, bilateral foraminal narrowing    MEDICAL HISTORY:  Francisco Cowan, is a 73 year old male referred by Dr. Marjory Lies seen in request by his primary care doctor   Gerrit Heck, for evaluation of unsteady gait, neck pain radiating pain to left upper extremity  History is obtained from the patient and review of electronic medical records. I personally reviewed pertinent available imaging films in PACS.   PMHx of  HTN Depression, anxiety Drink 24 oz beer daily Hx of Kidney stone Hx of bilateral CTS release in 1991 Left Ulnar transpositional surgery in 2018 Lumbar decompression in 2021, for low back pain, radiating pain to right side,    Since 2023, he noticed dizziness sensation, fell few times, he described lightheaded dizziness when he stands up quickly or change position quickly, fell frequently, often proceeding with similar sensation of lightheadedness, then lower extremity give up underneath him, denies similar sensation when sitting down or lying down position  He had a history of bilateral carpal tunnel release in 1991, now complains of mild bilateral feet cold numb sensation, finger numbness tingling, neck pain, if he turns his neck wrong, he describes shocking sensation up and down his spine, radiating paresthesia to his left upper extremity  He was seen by orthopedic surgeon Dr. Shon Baton, had MRI of cervical spine, but I do not have access to the film or report.   Laboratory evaluation for neuropathy showed no treatable etiology: A1c 5.2, normal B12, negative acetylcholine receptor antibody, hemoglobin 15.7, normal CMP, negative immunofixative protein electrophoresis  PHYSICAL EXAM:   Vitals:   06/07/23 1018  BP: (!) 142/76  Pulse: (!) 57  Resp: 15  SpO2: 96%   Not recorded     There is no height or weight on file to calculate BMI.  PHYSICAL EXAMNIATION:  Gen: NAD, conversant, well nourised, well groomed                     Cardiovascular: Regular rate rhythm, no peripheral edema, warm, nontender. Eyes: Conjunctivae clear without exudates or hemorrhage Neck: Supple, no carotid bruits. Pulmonary: Clear to auscultation bilaterally   NEUROLOGICAL EXAM:  MENTAL STATUS: Speech/cognition: Awake, alert, oriented to history taking and casual conversation CRANIAL NERVES: CN II: Visual fields are full to confrontation. Pupils are round equal and briskly reactive  to light. CN III, IV, VI: extraocular movement are normal. No ptosis. CN V: Facial sensation is intact to light touch CN VII: Face is symmetric with normal eye closure  CN VIII: Hearing is normal to causal conversation. CN IX, X: Phonation is  normal. CN XI: Head turning and shoulder shrug are intact  MOTOR: Right abductor pollicis brevis and bilateral intrinsic muscle atrophy, bilateral hammertoes  UE Shoulder Abduction Shoulder External Rotation Elbow Flexion Elbow  Extension Pronation Supination Wrist Flexion Wrist Extension Grip Finger  Abduction Abductor pollicis brevis Opponents  R 5 5 5 5 5 5 5 5  5- 5-  4 4  L 5 5 5 5 5 5 5 5  5- 5- 5  5   LE Hip Flexion Knee flexion Knee extension Ankle Dorsiflexion Eversion Ankle plantar Flexion Inversion Toe flexion/Extension  R 5 5 5 5 5 5 5  4/4  L 5 5 5 5 5 5 5  4/4     REFLEXES: R/L biceps 1/1, triceps1/1, knees 2/2, and absent ankles. Plantar responses are extensor bilaterally  SENSORY: Length-dependent decreased light touch, pinprick vibratory sensation to ankle level  COORDINATION: There is no trunk or limb dysmetria noted.  GAIT/STANCE: Posture is normal. Gait is steady, mild difficulty with heel walking, better with tiptoe  REVIEW OF SYSTEMS:  Full 14 system review of systems performed and notable only for as above All other review of systems were negative.   ALLERGIES: Allergies  Allergen Reactions   Cortisone     Pt stated, "I am only allergic to cortisone injections"   Nsaids Other (See Comments)    GI Bleeding    HOME MEDICATIONS: Current Outpatient Medications  Medication Sig Dispense Refill   allopurinol (ZYLOPRIM) 300 MG tablet Take 1.5 tablets (450 mg total) by mouth daily.     ammonium lactate (LAC-HYDRIN) 12 % lotion Apply 1 Application topically.     bisoprolol (ZEBETA) 5 MG tablet Take 5 mg by mouth daily.     buPROPion (WELLBUTRIN SR) 150 MG 12 hr tablet Take 1 tablet (150 mg total) by mouth See admin instructions. Taking with the Wellbutrin 300mg  dose for a total of 450mg  daily     buPROPion (WELLBUTRIN XL) 300 MG 24 hr tablet Take 1 tablet (300 mg total) by mouth daily. Taking with Wellbutrin SR 150mg  for 450mg  Total daily. 90 tablet 0    clobetasol cream (TEMOVATE) 0.05 % APPLY TOPICALLY TO INVOLVED AREAS ONE TIME DAILY FOR 2 WEEKS, THEN FOR 1 WEEK OFF, AND THEN REPEAT     lisinopril (ZESTRIL) 10 MG tablet Take 1 tablet by mouth daily.     LORazepam (ATIVAN) 1 MG tablet Take 1 tablet (1 mg total) by mouth every 8 (eight) hours as needed for anxiety or sleep.     Multiple Vitamin (MULTIVITAMIN) capsule Take 1 capsule by mouth daily.     pantoprazole (PROTONIX) 40 MG tablet Take 1 tablet (40 mg total) by mouth daily. 90 tablet 3   sertraline (ZOLOFT) 50 MG tablet Take 1 tablet (50 mg total) by mouth daily.     sildenafil (VIAGRA) 100 MG tablet TAKE 1 TABLET BY MOUTH AS NEEDED. TAKE 30-60 MINUTES BEFORE SEXUAL INTERCOURSE.     tamsulosin (FLOMAX) 0.4 MG CAPS capsule TAKE 1 CAPSULE BY MOUTH EVERY DAY 90 capsule 1   traMADol (ULTRAM) 50 MG tablet Take 1 tablet by mouth every 6 (six) hours as needed.     traZODone (DESYREL) 100 MG tablet Take 100-200 mg by mouth at bedtime  as needed.     Zoster Vaccine Adjuvanted [facility]) injection Administer Shingrix vaccination now and repeat in two months 1 each 1   No current facility-administered medications for this visit.    PAST MEDICAL HISTORY: Past Medical History:  Diagnosis Date   Abnormal ECG 09/19/2017   NSR. Left axis deviation.    Alcohol abuse    Anxiety    Depression    ED (erectile dysfunction)    GERD (gastroesophageal reflux disease)    History of kidney stones    Hypertension    Moderate tetrahydrocannabinol (THC) dependence (HCC)    Photodermatitis    Spinal stenosis    Urge incontinence     PAST SURGICAL HISTORY: Past Surgical History:  Procedure Laterality Date   CARPAL TUNNEL RELEASE Bilateral 1991   COLONOSCOPY     CYST REMOVAL TRUNK N/A 07/2014   back   ESOPHAGOGASTRODUODENOSCOPY     ROTATOR CUFF REPAIR Left 2003/2018   ROTATOR CUFF REPAIR Right 02/10/2014   Study: Echo  06/27/2016   LV size normal.  LV wall thickness normal.  LVEF 55-60%.   Diastolic filling pattern indicates impaired relaxation.  GLS test 17%.  LA is mildly dilated.  Moderate AV sclerosis without stenosis.  Trace MR.  RVSP 22.25 mmHg p   TRANSFORAMINAL LUMBAR INTERBODY FUSION (TLIF) WITH PEDICLE SCREW FIXATION 1 LEVEL N/A 11/22/2017   Procedure: TRANSFORAMINAL LUMBAR INTERBODY FUSION Lumbar four-five;  Surgeon: Venita Lick, MD;  Location: [facility] OR;  Service: Orthopedics;  Laterality: N/A;  4.5 hrs   ULNAR NERVE TRANSPOSITION Left 2018    FAMILY HISTORY: Family History  Problem Relation Age of Onset   Bipolar disorder Mother    Arthritis Mother    Depression Mother    Stroke Mother    Angina Father    Hypertension Father    Stroke Father    Lung cancer Brother    COPD Brother    Hearing loss Brother    Colon cancer Paternal Aunt    Bone cancer Paternal Uncle     SOCIAL HISTORY: Social History   Socioeconomic History   Marital status: Married    Spouse name: Not on file   Number of children: 2   Years of education: Not on file   Highest education level: Some college, no degree  Occupational History   Occupation: Radio broadcast assistant  Tobacco Use   Smoking status: Former    Current packs/day: 0.00    Types: Cigarettes    Start date: 1971    Quit date: 2002    Years since quitting: 23.2   Smokeless tobacco: Never  Vaping Use   Vaping status: Never Used  Substance and Sexual Activity   Alcohol use: Not Currently    Comment: drinks 2 cans of beer daily   Drug use: Not Currently    Types: Marijuana, Benzodiazepines    Comment: smokes marijuana daily   Sexual activity: Yes    Partners: Female  Other Topics Concern   Not on file  Social History Narrative   Marital status/children/pets: married. Lives in [place], [place] 6 months out of the year (usually leaves in November yearly to [place]).   Education/employment: Some college, Radio broadcast assistant.    Daily ETOH and THC use ( on records 02/2017).    Safety:      -smoke alarm in the  home:Yes     - wears seatbelt: Yes     - Feels safe in their relationships: Yes  Right handed   Wear glasses    Drink 1-2 cups per day       Social Drivers of Health   Financial Resource Strain: Low Risk  (03/27/2023)   Overall Financial Resource Strain (CARDIA)    Difficulty of Paying Living Expenses: Not very hard  Food Insecurity: No Food Insecurity (03/27/2023)   Hunger Vital Sign    Worried About Running Out of Food in the Last Year: Never true    Ran Out of Food in the Last Year: Never true  Transportation Needs: No Transportation Needs (03/27/2023)   PRAPARE - Administrator, Civil Service (Medical): No    Lack of Transportation (Non-Medical): No  Physical Activity: Insufficiently Active (03/27/2023)   Exercise Vital Sign    Days of Exercise per Week: 1 day    Minutes of Exercise per Session: 30 min  Stress: No Stress Concern Present (03/27/2023)   [employer] of Occupational Health - Occupational Stress Questionnaire    Feeling of Stress : Only a little  Social Connections: Socially Isolated (03/27/2023)   Social Connection and Isolation Panel [NHANES]    Frequency of Communication with Friends and Family: Once a week    Frequency of Social Gatherings with Friends and Family: Once a week    Attends Religious Services: Never    Database administrator or Organizations: No    Attends Banker Meetings: Never    Marital Status: Married  Catering manager Violence: Not At Risk (03/27/2023)   Humiliation, Afraid, Rape, and Kick questionnaire    Fear of Current or Ex-Partner: No    Emotionally Abused: No    Physically Abused: No    Sexually Abused: No      Levert Feinstein, M.D. Ph.D.  [facility] [address] [place], [place] [address] Ph: [phone] Fax: [phone]  CC:  Gerrit Heck, DO [address] [place],  [place] [address]  Gerrit Heck, DO

## 2023-06-08 ENCOUNTER — Other Ambulatory Visit: Payer: Self-pay

## 2023-06-08 ENCOUNTER — Other Ambulatory Visit (HOSPITAL_COMMUNITY): Payer: Self-pay

## 2023-06-08 ENCOUNTER — Other Ambulatory Visit: Payer: Self-pay | Admitting: Family Medicine

## 2023-06-08 MED ORDER — TRAZODONE HCL 100 MG PO TABS
100.0000 mg | ORAL_TABLET | Freq: Every evening | ORAL | 2 refills | Status: DC | PRN
Start: 2023-06-08 — End: 2023-07-28
  Filled 2023-06-08 – 2023-06-09 (×2): qty 60, 30d supply, fill #0

## 2023-06-09 ENCOUNTER — Other Ambulatory Visit (HOSPITAL_COMMUNITY): Payer: Self-pay

## 2023-06-09 ENCOUNTER — Other Ambulatory Visit: Payer: Self-pay

## 2023-06-15 DIAGNOSIS — M5412 Radiculopathy, cervical region: Secondary | ICD-10-CM | POA: Diagnosis not present

## 2023-06-16 ENCOUNTER — Encounter: Payer: Self-pay | Admitting: Neurology

## 2023-06-19 ENCOUNTER — Encounter: Payer: Self-pay | Admitting: Family Medicine

## 2023-06-19 MED ORDER — BUPROPION HCL ER (SR) 150 MG PO TB12: 150.0000 mg | ORAL_TABLET | ORAL | 3 refills | Status: DC

## 2023-06-19 MED ORDER — SERTRALINE HCL 50 MG PO TABS
100.0000 mg | ORAL_TABLET | Freq: Every day | ORAL | 3 refills | Status: DC
Start: 2023-06-19 — End: 2023-06-24

## 2023-06-21 ENCOUNTER — Encounter: Payer: Self-pay | Admitting: Diagnostic Neuroimaging

## 2023-06-22 NOTE — Telephone Encounter (Signed)
 noted

## 2023-06-24 MED ORDER — SERTRALINE HCL 100 MG PO TABS: 100.0000 mg | ORAL_TABLET | Freq: Every day | ORAL | 2 refills | Status: DC

## 2023-06-29 ENCOUNTER — Other Ambulatory Visit: Payer: Self-pay

## 2023-06-29 ENCOUNTER — Ambulatory Visit
Admission: EM | Admit: 2023-06-29 | Discharge: 2023-06-29 | Disposition: A | Discharge status: Home / Self Care | Attending: Family Medicine | Admitting: Family Medicine

## 2023-06-29 ENCOUNTER — Ambulatory Visit: Admitting: Family Medicine

## 2023-06-29 ENCOUNTER — Encounter: Admitting: Diagnostic Neuroimaging

## 2023-06-29 DIAGNOSIS — H9313 Tinnitus, bilateral: Secondary | ICD-10-CM | POA: Diagnosis not present

## 2023-06-29 DIAGNOSIS — Z77122 Contact with and (suspected) exposure to noise: Secondary | ICD-10-CM
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
## 2023-06-29 NOTE — Discharge Instructions (Signed)
 If your tinnitus and hearing do not improve, I recommend you see an ENT specialist  [facility] [address] [place] , [address] [phone]
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
## 2023-07-04 ENCOUNTER — Telehealth: Payer: Self-pay | Admitting: *Deleted

## 2023-07-04 NOTE — Telephone Encounter (Signed)
 Pt cd from [facility] file in medical records

## 2023-07-11 DIAGNOSIS — H9123 Sudden idiopathic hearing loss, bilateral: Secondary | ICD-10-CM | POA: Diagnosis not present

## 2023-07-11 DIAGNOSIS — H903 Sensorineural hearing loss, bilateral: Secondary | ICD-10-CM | POA: Diagnosis not present

## 2023-07-11 DIAGNOSIS — H9313 Tinnitus, bilateral: Secondary | ICD-10-CM | POA: Diagnosis not present

## 2023-07-18 ENCOUNTER — Encounter: Payer: Self-pay | Admitting: Diagnostic Neuroimaging

## 2023-07-25 ENCOUNTER — Encounter: Payer: Self-pay | Admitting: Neurology

## 2023-07-27 DIAGNOSIS — M5412 Radiculopathy, cervical region: Secondary | ICD-10-CM | POA: Diagnosis not present

## 2023-07-28 ENCOUNTER — Encounter: Payer: Self-pay | Admitting: Family Medicine

## 2023-07-28 MED ORDER — TRAZODONE HCL 100 MG PO TABS: 100.0000 mg | ORAL_TABLET | Freq: Every evening | ORAL | 2 refills | Status: DC | PRN

## 2023-08-10 ENCOUNTER — Telehealth: Admitting: Neurology

## 2023-08-10 DIAGNOSIS — G629 Polyneuropathy, unspecified: Secondary | ICD-10-CM | POA: Diagnosis not present

## 2023-08-10 DIAGNOSIS — R531 Weakness: Secondary | ICD-10-CM | POA: Diagnosis not present

## 2023-08-10 DIAGNOSIS — G5603 Carpal tunnel syndrome, bilateral upper limbs: Secondary | ICD-10-CM

## 2023-08-10 NOTE — Progress Notes (Signed)
 No chief complaint on file.     ASSESSMENT AND PLAN  Francisco Cowan is a 73 y.o. 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 Vaughn Georges,     DIAGNOSTIC DATA (LABS, IMAGING, TESTING) - I reviewed patient records, labs, notes, testing and imaging myself where available. MRI of cervical spine from [facility] clinic April 17, 2020, multilevel degenerative changes, mild canal stenosis C6-7, AP diameter of 9 mm, C6-7 moderate foraminal on the right, C5-6, the AP diameter of 10 mm, bilateral foraminal narrowing    MEDICAL HISTORY:  Francisco Cowan, is a 74 year old male referred by Dr. Salli Crawley seen in request by his primary care doctor   Rayma Calandra, for evaluation of unsteady gait, neck pain radiating pain to left upper extremity  History is obtained from the patient and review of electronic medical records. I personally reviewed pertinent available imaging films in PACS.   PMHx of  HTN Depression, anxiety Drink 24 oz beer daily Hx of Kidney stone Hx of bilateral CTS release in 1991 Left Ulnar transpositional surgery in 2018 Lumbar decompression in 2021, for low back pain, radiating pain to right side,   Since 2023, he noticed dizziness sensation, fell few times, he described lightheaded dizziness when he stands up quickly or change position quickly, fell frequently, often proceeding with similar sensation of lightheadedness, then lower extremity give up underneath  him, denies similar sensation when sitting down or lying down position  He had a history of bilateral carpal tunnel release in 1991, now complains of mild bilateral feet cold numb sensation, finger numbness tingling, neck pain, if he turns his neck wrong, he describes shocking sensation up and down his spine, radiating paresthesia to his left upper extremity  He was seen by orthopedic surgeon Dr. Vaughn Georges, had MRI of cervical spine, but I do not have access to the film or report.   Laboratory evaluation for neuropathy showed no treatable etiology: A1c 5.2, normal B12, negative acetylcholine receptor antibody, hemoglobin 15.7, normal CMP, negative immunofixative protein electrophoresis   Virtual Visit via video UPDATE August 10 2023   I discussed the limitations of evaluation and management by telemedicine and the availability of in person appointments. The patient expressed understanding and agreed to proceed  Location: Provider: [facility] office; Patient: Home  I connected with Francisco Cowan  on  August 10 2023 by a video enabled telemedicine application and verified that I am speaking with the correct person using two identifiers.  UPDATED HiSTORY He is concerned about his continuing bilateral feet numbness mild gait abnormality, and intermittent hands paresthesia  I explained the EMG nerve conduction study findings to him, he do have moderate length-dependent axonal sensorimotor polyneuropathy, laboratory evaluation so far showed no treatable etiology, to have a history of moderate alcohol use, which can potentially contribute.  In addition, he has moderately severe bilateral carpal tunnel syndrome, right worse than left; and mild bilateral ulnar neuropathy, crossed the elbow, did have a history of left ulnar transposition surgery in the past  He is also under the care of of Dr. Vaughn Georges  for neck pain, radiating pain to left upper extremity, pending appointment, I faxed the EMG nerve conduction  study report over  Observations/Objective: I have reviewed problem lists, medications, allergies. Awake, alert, oriented to history taking and casual conversation, facial symmetric, moving 4 extremities without difficulties,  REVIEW OF SYSTEMS:  Full 14 system review of systems performed and notable only for as above All other review of systems were negative.   ALLERGIES: Allergies  Allergen Reactions   Nsaids Other (See Comments)    GI Bleeding    HOME MEDICATIONS: Current Outpatient Medications  Medication Sig Dispense Refill   allopurinol  (ZYLOPRIM ) 300 MG tablet Take 1.5 tablets (450 mg total) by mouth daily.     ammonium lactate (LAC-HYDRIN) 12 % lotion Apply 1 Application topically.     bisoprolol (ZEBETA) 5 MG tablet Take 5 mg by mouth daily.     buPROPion  (WELLBUTRIN  SR) 150 MG 12 hr tablet Take 1 tablet (150 mg total) by mouth See admin instructions. Taking with the Wellbutrin  300mg  dose for a total of 450mg  daily 90 tablet 3   buPROPion  (WELLBUTRIN  XL) 300 MG 24 hr tablet Take 1 tablet (300 mg total) by mouth daily. Taking with Wellbutrin  SR 150mg  for 450mg  Total daily. 90 tablet 0   clobetasol cream (TEMOVATE) 0.05 % APPLY TOPICALLY TO INVOLVED AREAS ONE TIME DAILY FOR 2 WEEKS, THEN FOR 1 WEEK OFF, AND THEN REPEAT     LORazepam  (ATIVAN ) 1 MG tablet Take 1 tablet (1 mg total) by mouth every 8 (eight) hours as needed for anxiety or sleep.     Multiple Vitamin (MULTIVITAMIN) capsule Take 1 capsule by mouth daily.     pantoprazole  (PROTONIX ) 40 MG tablet Take 1 tablet (40 mg total) by mouth daily. 90 tablet 3   sertraline  (ZOLOFT ) 100 MG tablet Take 1 tablet (100 mg total) by mouth daily. 90 tablet 2   tamsulosin  (FLOMAX ) 0.4 MG CAPS capsule TAKE 1 CAPSULE BY MOUTH EVERY DAY 90 capsule 1   traZODone  (DESYREL ) 100 MG tablet Take 1-2 tablets (100-200 mg total) by mouth at bedtime as needed. 180 tablet 2   No current facility-administered medications for this visit.    PAST  MEDICAL HISTORY: Past Medical History:  Diagnosis Date   Abnormal ECG 09/19/2017   NSR. Left axis deviation.    Alcohol abuse    Anxiety    Depression    ED (erectile dysfunction)    GERD (gastroesophageal reflux disease)    History of kidney stones    Hypertension    Moderate tetrahydrocannabinol (THC) dependence (HCC)    Photodermatitis    Spinal stenosis    Urge incontinence     PAST SURGICAL HISTORY: Past Surgical History:  Procedure Laterality Date   CARPAL TUNNEL RELEASE Bilateral 1991   COLONOSCOPY     CYST REMOVAL TRUNK N/A 07/2014   back   ESOPHAGOGASTRODUODENOSCOPY     ROTATOR CUFF REPAIR Left 2003/2018   ROTATOR CUFF REPAIR Right 02/10/2014   Study: Echo  06/27/2016   LV size normal.  LV wall thickness normal.  LVEF 55-60%.  Diastolic filling pattern indicates impaired relaxation.  GLS test 17%.  LA is mildly dilated.  Moderate AV sclerosis without stenosis.  Trace MR.  RVSP 22.25 mmHg p   TRANSFORAMINAL LUMBAR INTERBODY FUSION (TLIF) WITH PEDICLE SCREW FIXATION 1 LEVEL N/A 11/22/2017   Procedure: TRANSFORAMINAL LUMBAR INTERBODY FUSION Lumbar four-five;  Surgeon: Mort Ards, MD;  Location: [facility] OR;  Service: Orthopedics;  Laterality: N/A;  4.5  hrs   ULNAR NERVE TRANSPOSITION Left 2018    FAMILY HISTORY: Family History  Problem Relation Age of Onset   Bipolar disorder Mother    Arthritis Mother    Depression Mother    Stroke Mother    Angina Father    Hypertension Father    Stroke Father    Lung cancer Brother    COPD Brother    Hearing loss Brother    Colon cancer Paternal Aunt    Bone cancer Paternal Uncle     SOCIAL HISTORY: Social History   Socioeconomic History   Marital status: Married    Spouse name: Not on file   Number of children: 2   Years of education: Not on file   Highest education level: Some college, no degree  Occupational History   Occupation: Radio broadcast assistant  Tobacco Use   Smoking status: Former    Current packs/day:  0.00    Types: Cigarettes    Start date: 1971    Quit date: 2002    Years since quitting: 23.4   Smokeless tobacco: Never  Vaping Use   Vaping status: Never Used  Substance and Sexual Activity   Alcohol use: Not Currently    Comment: drinks 2 cans of beer daily   Drug use: Not Currently    Types: Marijuana, Benzodiazepines    Comment: smokes marijuana daily   Sexual activity: Yes    Partners: Female  Other Topics Concern   Not on file  Social History Narrative   Marital status/children/pets: married. Lives in [place], [place] 6 months out of the year (usually leaves in November yearly to [place] ).   Education/employment: Some college, Radio broadcast assistant.    Daily ETOH and THC use ( on records 02/2017).    Safety:      -smoke alarm in the home:Yes     - wears seatbelt: Yes     - Feels safe in their relationships: Yes               Right handed   Wear glasses    Drink 1-2 cups per day       Social Drivers of Health   Financial Resource Strain: Low Risk  (03/27/2023)   Overall Financial Resource Strain (CARDIA)    Difficulty of Paying Living Expenses: Not very hard  Food Insecurity: No Food Insecurity (03/27/2023)   Hunger Vital Sign    Worried About Running Out of Food in the Last Year: Never true    Ran Out of Food in the Last Year: Never true  Transportation Needs: No Transportation Needs (03/27/2023)   PRAPARE - Administrator, Civil Service (Medical): No    Lack of Transportation (Non-Medical): No  Physical Activity: Insufficiently Active (03/27/2023)   Exercise Vital Sign    Days of Exercise per Week: 1 day    Minutes of Exercise per Session: 30 min  Stress: No Stress Concern Present (03/27/2023)   [employer] of Occupational Health - Occupational Stress Questionnaire    Feeling of Stress : Only a little  Social Connections: Socially Isolated (03/27/2023)   Social Connection and Isolation Panel    Frequency of Communication with Friends and  Family: Once a week    Frequency of Social Gatherings with Friends and Family: Once a week    Attends Religious Services: Never    Database administrator or Organizations: No    Attends Banker Meetings: Never    Marital Status: Married  Intimate Partner Violence: Not At Risk (03/27/2023)   Humiliation, Afraid, Rape, and Kick questionnaire    Fear of Current or Ex-Partner: No    Emotionally Abused: No    Physically Abused: No    Sexually Abused: No      Francisco Cowan, M.D. Ph.D.  [facility] [address], [address] [place], [place] [address] Ph: [phone] Fax: [phone]  CC:  Rayma Calandra, DO [address] [place],  [place] [address]  Rayma Calandra, DO

## 2023-08-11 ENCOUNTER — Encounter: Payer: Self-pay | Admitting: Family Medicine

## 2023-08-11 DIAGNOSIS — G4733 Obstructive sleep apnea (adult) (pediatric): Secondary | ICD-10-CM

## 2023-08-11 NOTE — Telephone Encounter (Signed)
 Orders placed.

## 2023-08-14 NOTE — Telephone Encounter (Signed)
 Community message sent to [employer]. Will await response.   Veronda Prude, RN

## 2023-08-16 DIAGNOSIS — M5412 Radiculopathy, cervical region: Secondary | ICD-10-CM | POA: Diagnosis not present

## 2023-08-16 DIAGNOSIS — M542 Cervicalgia: Secondary | ICD-10-CM | POA: Diagnosis not present

## 2023-08-16 NOTE — Telephone Encounter (Signed)
 Receipt confirmed by [employer].   Veronda Prude, RN

## 2023-08-18 ENCOUNTER — Encounter: Payer: Self-pay | Admitting: Family Medicine

## 2023-08-18 DIAGNOSIS — M542 Cervicalgia: Secondary | ICD-10-CM | POA: Diagnosis not present

## 2023-08-22 DIAGNOSIS — H40013 Open angle with borderline findings, low risk, bilateral: Secondary | ICD-10-CM | POA: Diagnosis not present

## 2023-08-22 DIAGNOSIS — H3562 Retinal hemorrhage, left eye: Secondary | ICD-10-CM | POA: Diagnosis not present

## 2023-08-22 DIAGNOSIS — M542 Cervicalgia: Secondary | ICD-10-CM | POA: Diagnosis not present

## 2023-08-23 ENCOUNTER — Ambulatory Visit: Admitting: Student

## 2023-08-23 ENCOUNTER — Encounter: Payer: Self-pay | Admitting: Student

## 2023-08-23 VITALS — BP 131/74 | HR 63 | Ht 68.0 in | Wt 198.8 lb

## 2023-08-23 DIAGNOSIS — G4733 Obstructive sleep apnea (adult) (pediatric): Secondary | ICD-10-CM

## 2023-08-23 NOTE — Progress Notes (Signed)
    SUBJECTIVE:   CHIEF COMPLAINT / HPI: Need for CPAP  Discussed the use of AI scribe software for clinical note transcription with the patient, who gave verbal consent to proceed.  Patient has been using CPAP machine for around 2 years.  Re located from [place] [facility] [place]  and received a sleep study at that time.    History of Present Illness Francisco Cowan is a 73 year old male with severe obstructive sleep apnea who presents for continuation of CPAP therapy.  He has used a CPAP machine for two years to manage his severe obstructive sleep apnea, confirmed by a 2022 sleep study. His CPAP machine recently became non-functional, requiring replacement. Since then, he experiences increased daytime sleepiness, loud snoring, and episodes of waking up choking. He does not fall asleep in random places.  He has high blood pressure. He also has cervical vertebrae issues, receiving injections and undergoing physical therapy, though injections have not alleviated symptoms.  STOP-BANG 8 points  Sleep study results:   PERTINENT  PMH / PSH: Hypertension, GERD, gout, depression, obesity  OBJECTIVE:   BP 131/74   Pulse 63   Ht 5' 8 (1.727 m)   Wt 198 lb 12.8 oz (90.2 kg)   SpO2 98%   BMI 30.23 kg/m   General: Well appearing, NAD, awake, alert, responsive to questions Head: Normocephalic atraumatic, neck circumference of 46 cm CV: Regular rate and rhythm no murmurs rubs or gallops Respiratory: Clear to ausculation bilaterally, no wheezes rales or crackles, chest rises symmetrically,  no increased work of breathing  ASSESSMENT/PLAN:   Assessment & Plan OSA (obstructive sleep apnea) Severe obstructive sleep apnea confirmed by 2022 sleep study. CPAP machine essential for symptom management and blood pressure control. Insurance requires documentation for approval. - Submit 2022 sleep study documentation and this progress note to insurance for CPAP approval. - Ensure nursing team  processes CPAP machine approval  Wendel Lesch, MD [facility]

## 2023-08-23 NOTE — Patient Instructions (Signed)
 It was great to see you! Thank you for allowing me to participate in your care!   Our plans for today:  - I will write your note and send to our nursing team to help with approval of CPAP machine  Take care and seek immediate care sooner if you develop any concerns.  Wendel Lesch, MD

## 2023-08-24 ENCOUNTER — Other Ambulatory Visit: Payer: Self-pay | Admitting: Family Medicine

## 2023-08-25 DIAGNOSIS — M542 Cervicalgia: Secondary | ICD-10-CM | POA: Diagnosis not present

## 2023-08-28 DIAGNOSIS — M542 Cervicalgia: Secondary | ICD-10-CM | POA: Diagnosis not present

## 2023-08-30 ENCOUNTER — Other Ambulatory Visit: Payer: Self-pay | Admitting: Family Medicine

## 2023-08-30 DIAGNOSIS — G4733 Obstructive sleep apnea (adult) (pediatric): Secondary | ICD-10-CM | POA: Insufficient documentation

## 2023-08-30 NOTE — Telephone Encounter (Signed)
 Sent follow up message to [employer]. Will await response.   Chiquita JAYSON English, RN

## 2023-08-31 DIAGNOSIS — M542 Cervicalgia: Secondary | ICD-10-CM | POA: Diagnosis not present

## 2023-08-31 DIAGNOSIS — M5416 Radiculopathy, lumbar region: Secondary | ICD-10-CM | POA: Diagnosis not present

## 2023-09-04 DIAGNOSIS — M542 Cervicalgia: Secondary | ICD-10-CM | POA: Diagnosis not present

## 2023-09-05 DIAGNOSIS — H6122 Impacted cerumen, left ear: Secondary | ICD-10-CM | POA: Diagnosis not present

## 2023-09-05 DIAGNOSIS — H9123 Sudden idiopathic hearing loss, bilateral: Secondary | ICD-10-CM | POA: Diagnosis not present

## 2023-09-05 DIAGNOSIS — H90A32 Mixed conductive and sensorineural hearing loss, unilateral, left ear with restricted hearing on the contralateral side: Secondary | ICD-10-CM | POA: Diagnosis not present

## 2023-09-05 DIAGNOSIS — H90A21 Sensorineural hearing loss, unilateral, right ear, with restricted hearing on the contralateral side: Secondary | ICD-10-CM | POA: Diagnosis not present

## 2023-09-07 DIAGNOSIS — M542 Cervicalgia: Secondary | ICD-10-CM | POA: Diagnosis not present

## 2023-09-11 DIAGNOSIS — M542 Cervicalgia: Secondary | ICD-10-CM | POA: Diagnosis not present

## 2023-09-14 DIAGNOSIS — M542 Cervicalgia: Secondary | ICD-10-CM | POA: Diagnosis not present

## 2023-09-18 ENCOUNTER — Encounter: Payer: Self-pay | Admitting: Family Medicine

## 2023-09-18 NOTE — Telephone Encounter (Signed)
 Called and spoke with patient. He would like to proceed with changing to different DME supplier.   Discussed with patient sending orders to [facility]. This supplier is in network with his insurance. Patient asks that we send order to [facility].   I have printed off the order and patient demographics. Placed in medical records for release of OV note from 08/23/23, as this will need to be faxed with order.   Chiquita JAYSON English, RN

## 2023-09-21 ENCOUNTER — Encounter: Payer: Self-pay | Admitting: Podiatry

## 2023-09-21 ENCOUNTER — Ambulatory Visit: Admitting: Podiatry

## 2023-09-21 DIAGNOSIS — B351 Tinea unguium: Secondary | ICD-10-CM

## 2023-09-21 DIAGNOSIS — L600 Ingrowing nail: Secondary | ICD-10-CM

## 2023-09-21 NOTE — Patient Instructions (Signed)

## 2023-09-21 NOTE — Progress Notes (Signed)
  Subjective:  Patient ID: Francisco Cowan, male    DOB: 02-Oct-1950,   MRN: [id number]  Chief Complaint  Patient presents with   Nail Problem    I have a toenail on the third toe on my left foot that needs to be replaced.    73 y.o. male presents for concern of left third digit nail that is painful and thick. He has had previous nails removed in the past and would like to remove this one. It causes pain and has difficulty trimming it. Has tried to trim and treat in past without relief. Denies any other pedal complaints. Denies n/v/f/c.   Past Medical History:  Diagnosis Date   Abnormal ECG 09/19/2017   NSR. Left axis deviation.    Alcohol abuse    Anxiety    Depression    ED (erectile dysfunction)    GERD (gastroesophageal reflux disease)    History of kidney stones    Hypertension    Moderate tetrahydrocannabinol (THC) dependence (HCC)    Photodermatitis    Spinal stenosis    Urge incontinence     Objective:  Physical Exam: Vascular: DP/PT pulses 2/4 bilateral. CFT <3 seconds. Normal hair growth on digits. No edema.  Skin. No lacerations or abrasions bilateral feet. Left third digit nail thickened and dystrophic and painful to palpation.  Musculoskeletal: MMT 5/5 bilateral lower extremities in DF, PF, Inversion and Eversion. Deceased ROM in DF of ankle joint.  Neurological: Sensation intact to light touch.   Assessment:   1. Ingrown nail   2. Onychomycosis      Plan:  Patient was evaluated and treated and all questions answered. Discussed ingrown toenails etiology and treatment options including procedure for removal vs conservative care.  Patient requesting removal of ingrown nail today. Procedure below.  Discussed procedure and post procedure care and patient expressed understanding.  Will follow-up in 2 weeks for nail check or sooner if any problems arise.    Procedure:  Procedure: total Nail Avulsion of left third digit  Surgeon: Asberry Failing, DPM   Pre-op Dx: Ingrown toenail without infection Post-op: Same  Place of Surgery: Office exam room.  Indications for surgery: Painful and ingrown toenail.    The patient is requesting removal of nail with  chemical matrixectomy. Risks and complications were discussed with the patient for which they understand and written consent was obtained. Under sterile conditions a total of 3 mL of  1% lidocaine  plain was infiltrated in a hallux block fashion. Once anesthetized, the skin was prepped in sterile fashion. A tourniquet was then applied. Next the entire left third digit nail was removed.   Next phenol was then applied under standard conditions to permanently destroy the matrix and copiously irrigated. Silvadene was applied. A dry sterile dressing was applied. After application of the dressing the tourniquet was removed and there is found to be an immediate capillary refill time to the digit. The patient tolerated the procedure well without any complications. Post procedure instructions were discussed the patient for which he verbally understood. Follow-up in two weeks for nail check or sooner if any problems are to arise. Discussed signs/symptoms of infection and directed to call the office immediately should any occur or go directly to the emergency room. In the meantime, encouraged to call the office with any questions, concerns, changes symptoms.   Asberry Failing, DPM

## 2023-09-22 ENCOUNTER — Encounter: Payer: Self-pay | Admitting: Family Medicine

## 2023-09-22 DIAGNOSIS — G4733 Obstructive sleep apnea (adult) (pediatric): Secondary | ICD-10-CM

## 2023-09-24 DIAGNOSIS — M5459 Other low back pain: Secondary | ICD-10-CM | POA: Diagnosis not present

## 2023-09-25 NOTE — Telephone Encounter (Signed)
 Message sent to [facility] with updates.   Chiquita JAYSON English, RN

## 2023-10-03 DIAGNOSIS — M545 Low back pain, unspecified: Secondary | ICD-10-CM | POA: Diagnosis not present

## 2023-10-04 ENCOUNTER — Telehealth: Payer: Self-pay | Admitting: Neurology

## 2023-10-04 NOTE — Telephone Encounter (Signed)
 Called and scheduled pt

## 2023-10-04 NOTE — Telephone Encounter (Signed)
 Patient called to schedule an appointment. Spoke with nurse, April and she working on getting patient schedule at the end of Dr. Georgianne schedule on for this month.

## 2023-10-04 NOTE — Telephone Encounter (Signed)
 Are you available to speak with Dr. Burnetta, [facility] calling to discuss thoughts about patient's cervical condition. Contact info: [phone]  Emailed Dr. Margaret and Dr. Onita.

## 2023-10-04 NOTE — Telephone Encounter (Signed)
 I talked with orthopedic surgeon Dr. Burnetta, patient has worsening left lower extremity weakness, give out underneath him some, making fall,  He did have a significant abnormality on MRI of the lumbar, making him a potential candidate for lumbar decompression surgery,  Previous EMG nerve conduction study in April 2025, needle examination was only performed at right lower extremity muscles,  Please give him a follow-up visit, at the end of the day,(in case I need to repeat some EMG component), advised him to bring MRI CD from orthopedic surgeon for me to review, including MRI of the cervical and lumbar spine

## 2023-10-05 ENCOUNTER — Ambulatory Visit: Admitting: Podiatry

## 2023-10-11 ENCOUNTER — Encounter: Payer: Self-pay | Admitting: Podiatry

## 2023-10-11 ENCOUNTER — Ambulatory Visit: Admitting: Podiatry

## 2023-10-11 DIAGNOSIS — L600 Ingrowing nail: Secondary | ICD-10-CM

## 2023-10-11 NOTE — Progress Notes (Signed)
  Subjective:  Patient ID: Francisco Cowan, male    DOB: 1950-04-15,   MRN: [id number]  Chief Complaint  Patient presents with   Nail Problem    It's doing great.    73 y.o. male presents for follow-up of left third digit nail removal. Relates doing well and soaking as instructed . Denies any other pedal complaints. Denies n/v/f/c.   Past Medical History:  Diagnosis Date   Abnormal ECG 09/19/2017   NSR. Left axis deviation.    Alcohol abuse    Anxiety    Depression    ED (erectile dysfunction)    GERD (gastroesophageal reflux disease)    History of kidney stones    Hypertension    Moderate tetrahydrocannabinol (THC) dependence (HCC)    Photodermatitis    Spinal stenosis    Urge incontinence     Objective:  Physical Exam: Vascular: DP/PT pulses 2/4 bilateral. CFT <3 seconds. Normal hair growth on digits. No edema.  Skin. No lacerations or abrasions bilateral feet. Left third digit nail healing well.  Musculoskeletal: MMT 5/5 bilateral lower extremities in DF, PF, Inversion and Eversion. Deceased ROM in DF of ankle joint.  Neurological: Sensation intact to light touch.   Assessment:   1. Ingrown nail      Plan:  Patient was evaluated and treated and all questions answered. Toe was evaluated and appears to be healing well.  May discontinue soaks and neosporin.  Patient to follow-up as needed.    Asberry Failing, DPM

## 2023-10-18 ENCOUNTER — Ambulatory Visit: Admitting: Neurology

## 2023-10-18 ENCOUNTER — Telehealth: Payer: Self-pay | Admitting: Neurology

## 2023-10-18 VITALS — BP 141/77 | HR 55 | Wt 199.5 lb

## 2023-10-18 DIAGNOSIS — R531 Weakness: Secondary | ICD-10-CM

## 2023-10-18 DIAGNOSIS — W19XXXD Unspecified fall, subsequent encounter: Secondary | ICD-10-CM

## 2023-10-18 DIAGNOSIS — G629 Polyneuropathy, unspecified: Secondary | ICD-10-CM

## 2023-10-18 NOTE — Procedures (Signed)
 Full Name: Natthew Marlatt Gender: Male MRN #: [id number] Date of Birth: 03/21/1950    Visit Date: 10/18/2023 16:15 Age: 73 Years History: 73 year old male with history of unsteady gait, frequent falling,  Summary of the test: Nerve conduction study: Left sural, superficial peroneal sensory responses were absent.  Left tibial motor response showed significantly decreased CMAP amplitude.  Left peroneal to EDB motor responses were within normal limits, with the exception of mildly decreased conduction velocity.  Electromyography: Selected needle examination were performed at bilateral lower extremity muscles, bilateral lumbosacral paraspinal muscles. There is only mild chronic neuropathic changes involving bilateral L4-5 myotomes, there was no significant difference at left and right extremities.  There was no spontaneous activity at bilateral lumbosacral paraspinal muscles.  Conclusion: This is an abnormal study.  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  Compared to previous study in April 2025, there was no significant change.    ------------------------------- Modena Callander. M.D. Ph.D.   [facility] [address] [place], [place] [address] Tel: [phone] Fax: [phone]  Verbal informed consent was obtained from the patient, patient was informed of potential risk of procedure, including bruising, bleeding, hematoma formation, infection, muscle weakness, muscle pain, numbness, among others.        MNC    Nerve / Sites Muscle Latency Ref. Amplitude Ref. Rel Amp Segments Distance Velocity Ref. Area    ms ms mV mV %  cm m/s m/s mVms  L Peroneal - EDB     Ankle EDB 5.8 <=6.5 4.0 >=2.0 100 Ankle - EDB 9   11.7     Fib head EDB 14.1  3.2  79.4 Fib head - Ankle 29 35 >=44 10.9     Pop fossa EDB 16.8  3.2  99.6 Pop fossa -  Fib head 10 36 >=44 10.9         Pop fossa - Ankle      L Tibial - AH     Ankle AH 6.2 <=5.8 0.7 >=4.0 100 Ankle - AH 9   2.0     Pop fossa AH 17.6  0.2  34.4 Pop fossa - Ankle 45 39 >=41          SNC    Nerve / Sites Rec. Site Peak Lat Ref.  Amp Ref. Segments Distance    ms ms V V  cm  L Sural - Ankle (Calf)     Calf Ankle NR <=4.4 NR >=6 Calf - Ankle 14  L Superficial peroneal - Ankle     Lat leg Ankle NR <=4.4 NR >=6 Lat leg - Ankle 14         F  Wave    Nerve F Lat Ref.   ms ms  L Peroneal - EDB 62.3 <=56.0  L Tibial - AH 70.9 <=56.0         EMG Summary Table    Spontaneous MUAP Recruitment  Muscle IA Fib PSW Fasc Other Amp Dur. Poly Pattern  R. Tibialis anterior Normal None None None _______ Normal Normal Normal Reduced  R. Tibialis posterior Normal None None None _______ Normal Normal Normal Normal  R. Peroneus longus Normal None None None _______ Normal Normal Normal Reduced  R. Gastrocnemius (Medial head) Normal None None None _______ Normal Normal Normal Normal  R. Vastus lateralis Normal None None None _______ Normal Normal Normal Normal  L.  Vastus lateralis Normal None None None _______ Normal Normal Normal Normal  L. Tibialis anterior Normal None None None _______ Normal Normal Normal Reduced  L. Tibialis posterior Normal None None None _______ Normal Normal Normal Normal  L. Gastrocnemius (Medial head) Normal None None None _______ Normal Normal Normal Normal  L. Peroneus longus Normal None None None _______ Normal Normal Normal Reduced  L. Biceps femoris (short head) Normal None None None _______ Normal Normal Normal Normal  R. Biceps femoris (short head) Normal None None None _______ Normal Normal Normal Normal  R. Lumbar paraspinals (low) Normal None None None _______ Normal Normal Normal Normal  R. Lumbar paraspinals (mid) Normal None None None _______ Normal Normal Normal Normal  L. Lumbar paraspinals (low) Normal None None None _______ Normal Normal Normal  Normal  L. Lumbar paraspinals (mid) Normal None None None _______ Normal Normal Normal Normal

## 2023-10-18 NOTE — Progress Notes (Signed)
 Chief Complaint  Patient presents with   nerve conduction     Follow up, room 15, self      ASSESSMENT AND PLAN  Francisco Cowan is a 73 y.o. 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 MRI of cervical spine from [facility] April 17, 2020, multilevel degenerative changes, mild canal stenosis C6-7, AP diameter of 9 mm, C6-7 moderate foraminal on the right, C5-6, the AP diameter of 10 mm, bilateral foraminal narrowing  MEDICAL HISTORY:  Francisco Cowan, is a 73 year old male referred by Dr. Margaret seen in request by his primary care doctor   Cleotilde Lukes, for evaluation of unsteady gait, neck pain radiating pain to left upper extremity  History is obtained from the patient and review of electronic medical records. I personally reviewed pertinent available imaging films in PACS.   PMHx of  HTN Depression, anxiety Drink 24 oz beer daily Hx of Kidney stone Hx of bilateral CTS release in 1991 Left Ulnar transpositional surgery in 2018 Lumbar decompression in 2021, for low back pain, radiating pain to right side,   Since 2023, he noticed dizziness sensation, fell few times,  he described lightheaded dizziness when he stands up quickly or change position quickly, fell frequently, often proceeding with similar sensation of lightheadedness, then lower extremity give up underneath him, denies similar sensation when sitting down or lying down position  He had a history of bilateral carpal tunnel release in 1991, now complains of mild bilateral feet cold numb sensation, finger numbness tingling, neck pain, if he turns his neck wrong, he describes shocking sensation up and down his spine, radiating paresthesia to his left upper extremity  He was seen by orthopedic surgeon Dr. Burnetta, had MRI of cervical spine, but I do not have access to the film or report.   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  In addition, he has moderately severe bilateral carpal tunnel syndrome, right worse than left; and mild bilateral ulnar neuropathy, crossed the elbow, did have a history of left ulnar transposition surgery in the past  He is also under the care of of Dr. Burnetta for neck pain, radiating pain to left upper extremity, pending appointment, I faxed the EMG nerve conduction study report over  UPDATE October 18 2023: MRI of lumbar spine from [facility] September 24, 2023,  Postsurgical change of lumbar spine fusion L4-5, multiple level disc degeneration, L2-3, moderate to severe right and  moderate left foraminal stenosis, with potential encroachment on the exiting bilateral L2 nerve roots, L3-4, severe right, moderate to severe left foraminal stenosis with encroachment on the exiting bilateral L3 nerve roots, L4-5 postsurgical level, advanced facet arthrosis, mild to moderate left mild right foraminal stenosis, L5-S1, no significant central canal stenosis mild to moderate left mild right foraminal stenosis  I did not see MRI of cervical report  He complains of slow worsening gait abnormality, falling episode, it usually happens shortly after he stands up, walking towards the kitchen, or answering the door, his leg just gave out underneath him, left leg seems to give him more trouble  He has no significant orthostatic blood pressure change on today's examination,  Talked with his orthopedic surgeon Dr. Burnetta on October 04, 2023, he is concerned about his worsening unsteady gait, requesting EMG study of left lower extremity to evaluate left lumbar radiculopathy  EMG nerve conduction study of left lower extremity October 18, 2023, showed no significant difference compared to findings at right lower extremity in April 2025, continued evidence of moderate length-dependent axonal peripheral neuropathy, mild left L4-5 radiculopathy, mixture of normal and mild neuropathic motor unit potential, with slight decreased recruitment patterns  On examination, he has more right distal weakness, moderate right toe flexion extension weakness, right hammertoes, only slight left to distal weakness, length-dependent sensory changes,  He does complains of worsening urinary urgency, brisk patellar reflex,  PHYSICAL EXAMNIATION: Blood pressure lying down 136/78, heart rate of 60 Sitting up 139/84 heart rate of 64 Standing up 127/74 heart rate of 62 Standing up for 1 minute 124/77 heart rate of 60  Gen: NAD, conversant, well nourised, well groomed                     Cardiovascular: Regular rate rhythm,  no peripheral edema, warm, nontender. Eyes: Conjunctivae clear without exudates or hemorrhage Neck: Supple, no carotid bruits. Pulmonary: Clear to auscultation bilaterally   NEUROLOGICAL EXAM:  MENTAL STATUS: Speech/cognition: Awake, alert oriented to history taking and casual conversation  CRANIAL NERVES: CN II: Visual fields are full to confrontation.  Pupils are round equal and briskly reactive to light. CN III, IV, VI: extraocular movement are normal. No ptosis. CN V: Facial sensation is intact to pinprick in all 3 divisions bilaterally. Corneal responses are intact.  CN VII: Face is symmetric with normal eye closure and smile. CN VIII: Hearing is normal to casual conversation CN IX, X: Palate elevates symmetrically. Phonation is normal. CN XI: Head turning and shoulder shrug are intact CN XII: Tongue is midline with normal movements and no atrophy.  MOTOR: Right hammertoe, moderate right toe flexion extension weakness, slight weakness on the left toe  REFLEXES: Reflexes are 2  and symmetric at the biceps, triceps, brisk at bilateral knee knees, and absent at ankles. Plantar responses are extensor bilaterally  SENSORY: Intact to light touch, pinprick, positional and vibratory sensation are intact in fingers and toes.  COORDINATION: Rapid alternating movements and fine finger movements are intact. There is no dysmetria on finger-to-nose and heel-knee-shin.    GAIT/STANCE: Push-up, cautious, wide-based, unsteady    REVIEW OF SYSTEMS:  Full 14 system review of systems performed and notable only for as above All other review of systems were negative.   ALLERGIES: Allergies  Allergen Reactions   Nsaids  Other (See Comments)    GI Bleeding    HOME MEDICATIONS: Current Outpatient Medications  Medication Sig Dispense Refill   allopurinol  (ZYLOPRIM ) 300 MG tablet Take 1.5 tablets (450 mg total) by mouth daily. (Patient taking differently: Take 450 mg by mouth daily. Take  (150 mg) 1/2 tablet daily)     ammonium lactate (LAC-HYDRIN) 12 % lotion Apply 1 Application topically.     bisoprolol (ZEBETA) 5 MG tablet Take 5 mg by mouth daily.     buPROPion  (WELLBUTRIN  SR) 150 MG 12 hr tablet Take 1 tablet (150 mg total) by mouth See admin instructions. Taking with the Wellbutrin  300mg  dose for a total of 450mg  daily 90 tablet 3   buPROPion  (WELLBUTRIN  XL) 300 MG 24 hr tablet TAKE 1 TABLET (300 MG TOTAL) BY MOUTH DAILY. TAKING WITH WELLBUTRIN  SR 150MG  FOR 450MG  TOTAL DAILY. 90 tablet 0   clobetasol cream (TEMOVATE) 0.05 % APPLY TOPICALLY TO INVOLVED AREAS ONE TIME DAILY FOR 2 WEEKS, THEN FOR 1 WEEK OFF, AND THEN REPEAT     LORazepam  (ATIVAN ) 1 MG tablet Take 1 tablet (1 mg total) by mouth every 8 (eight) hours as needed for anxiety or sleep.     Multiple Vitamin (MULTIVITAMIN) capsule Take 1 capsule by mouth daily.     pantoprazole  (PROTONIX ) 40 MG tablet Take 1 tablet (40 mg total) by mouth daily. 90 tablet 3   sertraline  (ZOLOFT ) 100 MG tablet Take 1 tablet (100 mg total) by mouth daily. 90 tablet 2   tamsulosin  (FLOMAX ) 0.4 MG CAPS capsule TAKE 1 CAPSULE BY MOUTH EVERY DAY 90 capsule 1   traZODone  (DESYREL ) 100 MG tablet Take 1-2 tablets (100-200 mg total) by mouth at bedtime as needed. 180 tablet 2   No current facility-administered medications for this visit.    PAST MEDICAL HISTORY: Past Medical History:  Diagnosis Date   Abnormal ECG 09/19/2017   NSR. Left axis deviation.    Alcohol abuse    Anxiety    Depression    ED (erectile dysfunction)    GERD (gastroesophageal reflux disease)    History of kidney stones    Hypertension    Moderate tetrahydrocannabinol (THC) dependence (HCC)    Photodermatitis    Spinal stenosis    Urge incontinence     PAST SURGICAL HISTORY: Past Surgical History:  Procedure Laterality Date   CARPAL TUNNEL RELEASE Bilateral 1991   COLONOSCOPY     CYST REMOVAL TRUNK N/A 07/2014   back   ESOPHAGOGASTRODUODENOSCOPY      ROTATOR CUFF REPAIR Left 2003/2018   ROTATOR CUFF REPAIR Right 02/10/2014   Study: Echo  06/27/2016   LV size normal.  LV wall thickness normal.  LVEF 55-60%.  Diastolic filling pattern indicates impaired relaxation.  GLS test 17%.  LA is mildly dilated.  Moderate AV sclerosis without stenosis.  Trace MR.  RVSP 22.25 mmHg p   TRANSFORAMINAL LUMBAR INTERBODY FUSION (TLIF) WITH PEDICLE SCREW FIXATION 1 LEVEL N/A 11/22/2017   Procedure: TRANSFORAMINAL LUMBAR INTERBODY FUSION Lumbar four-five;  Surgeon: Burnetta Aures, MD;  Location: [facility] OR;  Service: Orthopedics;  Laterality: N/A;  4.5 hrs   ULNAR NERVE TRANSPOSITION Left 2018    FAMILY HISTORY: Family History  Problem Relation Age of Onset   Bipolar disorder Mother    Arthritis Mother    Depression Mother    Stroke Mother    Angina Father    Hypertension Father    Stroke Father    Lung cancer Brother  COPD Brother    Hearing loss Brother    Colon cancer Paternal Aunt    Bone cancer Paternal Uncle     SOCIAL HISTORY: Social History   Socioeconomic History   Marital status: Married    Spouse name: Not on file   Number of children: 2   Years of education: Not on file   Highest education level: Some college, no degree  Occupational History   Occupation: Radio broadcast assistant  Tobacco Use   Smoking status: Former    Current packs/day: 0.00    Types: Cigarettes    Start date: 1971    Quit date: 2002    Years since quitting: 23.6   Smokeless tobacco: Never  Vaping Use   Vaping status: Never Used  Substance and Sexual Activity   Alcohol use: Yes    Comment: drinks 2 cans of beer daily   Drug use: Yes    Types: Marijuana, Benzodiazepines    Comment: smokes marijuana - once in a while   Sexual activity: Yes    Partners: Female  Other Topics Concern   Not on file  Social History Narrative   Marital status/children/pets: married. Lives in [place], [place] 6 months out of the year (usually leaves in November yearly to  [place] ).   Education/employment: Some college, Radio broadcast assistant.    Daily ETOH and THC use ( on records 02/2017).    Safety:      -smoke alarm in the home:Yes     - wears seatbelt: Yes     - Feels safe in their relationships: Yes               Right handed   Wear glasses    Drink 1-2 cups per day       Social Drivers of Health   Financial Resource Strain: Low Risk  (03/27/2023)   Overall Financial Resource Strain (CARDIA)    Difficulty of Paying Living Expenses: Not very hard  Food Insecurity: No Food Insecurity (03/27/2023)   Hunger Vital Sign    Worried About Running Out of Food in the Last Year: Never true    Ran Out of Food in the Last Year: Never true  Transportation Needs: No Transportation Needs (03/27/2023)   PRAPARE - Administrator, Civil Service (Medical): No    Lack of Transportation (Non-Medical): No  Physical Activity: Insufficiently Active (03/27/2023)   Exercise Vital Sign    Days of Exercise per Week: 1 day    Minutes of Exercise per Session: 30 min  Stress: No Stress Concern Present (03/27/2023)   [employer] of Occupational Health - Occupational Stress Questionnaire    Feeling of Stress : Only a little  Social Connections: Socially Isolated (03/27/2023)   Social Connection and Isolation Panel    Frequency of Communication with Friends and Family: Once a week    Frequency of Social Gatherings with Friends and Family: Once a week    Attends Religious Services: Never    Database administrator or Organizations: No    Attends Banker Meetings: Never    Marital Status: Married  Catering manager Violence: Not At Risk (03/27/2023)   Humiliation, Afraid, Rape, and Kick questionnaire    Fear of Current or Ex-Partner: No    Emotionally Abused: No    Physically Abused: No    Sexually Abused: No      Modena Callander, M.D. Ph.D.  [facility] [address], [address] [place], [place] [address] Ph: (  [phone] Fax:  [phone]  CC:  Cleotilde Lukes, DO [address] [place],  [place] [address]  Cleotilde Lukes, DO

## 2023-10-18 NOTE — Telephone Encounter (Signed)
 Get MR cervical record and film from [facility]

## 2023-10-19 ENCOUNTER — Telehealth: Payer: Self-pay | Admitting: Neurology

## 2023-10-19 NOTE — Telephone Encounter (Signed)
 Referral  For  Physical Therapy  sent thru [facility] to [facility]  [facility] Phone- [phone]

## 2023-10-23 ENCOUNTER — Ambulatory Visit: Attending: Neurology

## 2023-10-23 ENCOUNTER — Other Ambulatory Visit: Payer: Self-pay

## 2023-10-23 DIAGNOSIS — G629 Polyneuropathy, unspecified: Secondary | ICD-10-CM | POA: Diagnosis not present

## 2023-10-23 DIAGNOSIS — R2681 Unsteadiness on feet: Secondary | ICD-10-CM | POA: Diagnosis not present

## 2023-10-23 DIAGNOSIS — R2689 Other abnormalities of gait and mobility: Secondary | ICD-10-CM | POA: Diagnosis not present

## 2023-10-23 DIAGNOSIS — W19XXXD Unspecified fall, subsequent encounter: Secondary | ICD-10-CM | POA: Insufficient documentation

## 2023-10-23 DIAGNOSIS — R262 Difficulty in walking, not elsewhere classified: Secondary | ICD-10-CM

## 2023-10-23 DIAGNOSIS — R531 Weakness: Secondary | ICD-10-CM | POA: Diagnosis not present

## 2023-10-23 NOTE — Therapy (Signed)
 OUTPATIENT PHYSICAL THERAPY NEURO EVALUATION   Patient Name: Francisco Cowan MRN: [id number] DOB:January 28, 1951, 73 y.o., male Today's Date: 10/23/2023   PCP: Francisco Lukes, DO REFERRING PROVIDER: Onita Duos, MD  END OF SESSION:  PT End of Session - 10/23/23 1153     Visit Number 1    Number of Visits 13    Date for PT Re-Evaluation 12/04/23    Authorization Type [employer]    Progress Note Due on Visit 10    PT Start Time 1153   late arrival   PT Stop Time 1233    PT Time Calculation (min) 40 min          Past Medical History:  Diagnosis Date   Abnormal ECG 09/19/2017   NSR. Left axis deviation.    Alcohol abuse    Anxiety    Depression    ED (erectile dysfunction)    GERD (gastroesophageal reflux disease)    History of kidney stones    Hypertension    Moderate tetrahydrocannabinol (THC) dependence (HCC)    Photodermatitis    Spinal stenosis    Urge incontinence    Past Surgical History:  Procedure Laterality Date   CARPAL TUNNEL RELEASE Bilateral 1991   COLONOSCOPY     CYST REMOVAL TRUNK N/A 07/2014   back   ESOPHAGOGASTRODUODENOSCOPY     ROTATOR CUFF REPAIR Left 2003/2018   ROTATOR CUFF REPAIR Right 02/10/2014   Study: Echo  06/27/2016   LV size normal.  LV wall thickness normal.  LVEF 55-60%.  Diastolic filling pattern indicates impaired relaxation.  GLS test 17%.  LA is mildly dilated.  Moderate AV sclerosis without stenosis.  Trace MR.  RVSP 22.25 mmHg p   TRANSFORAMINAL LUMBAR INTERBODY FUSION (TLIF) WITH PEDICLE SCREW FIXATION 1 LEVEL N/A 11/22/2017   Procedure: TRANSFORAMINAL LUMBAR INTERBODY FUSION Lumbar four-five;  Surgeon: Francisco Aures, MD;  Location: [facility] OR;  Service: Orthopedics;  Laterality: N/A;  4.5 hrs   ULNAR NERVE TRANSPOSITION Left 2018   Patient Active Problem List   Diagnosis Date Noted   OSA (obstructive sleep apnea) 08/30/2023   Bilateral carpal tunnel syndrome 08/10/2023   Fall 06/07/2023   Weakness 06/07/2023    Neuropathy 06/07/2023   Encounter to establish care with new doctor 01/19/2023   Obesity (BMI 30-39.9) 10/08/2018   Acute gout of left ankle 10/08/2018   Ingrown nail of second toe of left foot 10/08/2018   S/P lumbar fusion 11/22/2017   Allergy 10/12/2017   GERD (gastroesophageal reflux disease) 10/12/2017   Spinal stenosis 10/12/2017   Depression, major, single episode, mild (HCC) 10/12/2017   GAD (generalized anxiety disorder) 10/12/2017   Elevated glucose 10/11/2017   Essential hypertension 10/11/2017   Degeneration of lumbar intervertebral disc 08/01/2017   Degenerative lumbar spinal stenosis 08/01/2017   Degenerative spondylolisthesis 08/01/2017    ONSET DATE: 2 years  REFERRING DIAG: G62.9 (ICD-10-CM) - Neuropathy W19.XXXD (ICD-10-CM) - Fall, subsequent encounter R53.1 (ICD-10-CM) - Weakness  THERAPY DIAG:  Unsteadiness on feet  Other abnormalities of gait and mobility  Difficulty in walking, not elsewhere classified  Rationale for Evaluation and Treatment: Rehabilitation  SUBJECTIVE:  SUBJECTIVE STATEMENT: Notes ongoing issues with neuropathy and balance over past two years.  instances when he has been sedentary and goes to transfer or initiate movement his legs become weak and unstable and experiences similar feeling of weakness and legs giving way when standing/walking for prolonged periods. He notes neuropathy affecting his hands as well with atrophy present throughout. Balance has worsened significantly over course of time and several falls in past few months Pt accompanied by: self  PERTINENT HISTORY: Francisco Cowan is a 73 y.o. male   2 years history of lightheadedness when standing up, unsteady gait, falling episode Neck pain, radiating pain along his spine, left upper  extremity  PAIN:  Are you having pain? No  PRECAUTIONS: Fall  RED FLAGS: Notes some instances of urinary incontinence-urgency--denies any saddle anesthesia    WEIGHT BEARING RESTRICTIONS: No  FALLS: Has patient fallen in last 6 months? Yes. Number of falls at least 6-7 times--notes falling to the right more commonly and feels RLE weakness is more pronounced.   LIVING ENVIRONMENT: Lives with: lives with their family Lives in: House/apartment Stairs: ramp entrance, 1 level set-up Has following equipment at home: hurrycane, walking stick, RW, tub/shower w/ grab bars  PLOF: Independent, reports using AE for buttons  PATIENT GOALS: not fall down  OBJECTIVE:  Note: Objective measures were completed at Evaluation unless otherwise noted.  DIAGNOSTIC FINDINGS:   Per Dr. 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, only mild chronic lumbosacral radiculopathy, fairly symmetric left and right,             Referred to physical therapy             Get MRI cervical from [facility]  Blood pressure lying down 136/78, heart rate of 60 Sitting up 139/84 heart rate of 64 Standing up 127/74 heart rate of 62 Standing up for 1 minute 124/77 heart rate of 60  COGNITION: Overall cognitive status: Within functional limits for tasks assessed   SENSATION: Notes a stocking/glove distrubution of numbness/tingling of extremities   COORDINATION: Difficulty with rapid alternating movements, RUE> LUE    MUSCLE TONE: possibly 2 beat clonus right ankle      POSTURE: No Significant  postural limitations  LOWER EXTREMITY ROM:     WNL  LOWER EXTREMITY MMT:    Strength is grossly 5/5 to seated resisted tests/myotomes BLE BED MOBILITY:  Independent  TRANSFERS: Sit to stand: Complete Independence  Assistive device utilized: None     Stand to sit: Complete Independence  Assistive device utilized: None     Chair to chair: Complete Independence  Assistive device utilized: None       RAMP:    CURB:  Not tested  STAIRS: Not tested GAIT: Findings: Gait Characteristics: wide BOS and Comments: unsteady with turns  FUNCTIONAL TESTS:  5 times sit to stand: TBD 10 meter walk test: TBD Berg Balance Test: 46/56 Dynamic Gait Index: TBD  M-CTSIB  Condition 1: Firm Surface,  EO 30 Sec, Mild Sway  Condition 2: Firm Surface, EC 30 Sec, Moderate Sway  Condition 3: Foam Surface, EO 30 Sec, Mild Sway  Condition 4: Foam Surface, EC 18 Sec, Moderate and Severe Sway     PATIENT SURVEYS:  ABC scale: TBD                                                                                                                              TREATMENT DATE: 10/23/23    PATIENT EDUCATION: Education details: rationale of PT intervention Person educated: Patient Education method: Explanation Education comprehension: verbalized understanding  HOME EXERCISE PROGRAM: TBD  GOALS: Goals reviewed with patient? Yes  SHORT TERM GOALS: Target date: 11/13/2023    Patient will be independent in HEP to improve functional outcomes Baseline: Goal status: INITIAL  2.  Demo improved postural control per mild sway x 30 sec condition 4 M-CTSIB for improved safety during ADL Baseline: 18 sec mod-severe Goal status: INITIAL  3.  Demo improved standing balance per score 50/56 Berg Balance Test for improved safety during ADL Baseline: 46/56 Goal status: INITIAL    LONG TERM GOALS: Target date: 12/04/2023    Dynamic Gait Index Baseline: TBD Goal status: INITIAL  2.  ABC scale Baseline:  TBD Goal status: INITIAL  3.  Modified independent ambulation uneven surfaces to improve safety in community Baseline: reports avoiding Goal status: INITIAL  4.  Gait speed, 10 meter walk Baseline: TBD Goal status: INITIAL    ASSESSMENT:  CLINICAL IMPRESSION: Patient is a 73 y.o. male who was seen today for physical therapy evaluation and treatment for G62.9 (ICD-10-CM) - Neuropathy W19.XXXD (ICD-10-CM) - Fall, subsequent encounter R53.1 (ICD-10-CM) - Weakness.  Demonstrates difficulty with rapid alternating coordination demands and postural unsteadiness w/ M-CTSIB testing and ambulates with wide BOS and unsteadiness in turns.  Berg Balance Test w/ low risk for falls per score 46/56 which is below normative values for age-matched community-dwelling older adults.  Pt would benefit from ongoing assessment and development of relevant strategies to improve mobility, activity tolerance, balance, and reduce risk for falls.    OBJECTIVE IMPAIRMENTS: Abnormal gait, decreased activity tolerance, decreased balance, decreased coordination, decreased endurance, decreased knowledge of use of DME, difficulty walking, impaired sensation, and impaired UE functional use.   ACTIVITY LIMITATIONS: carrying, lifting, standing, stairs, transfers, reach over head, and locomotion level  PARTICIPATION LIMITATIONS: meal prep, cleaning, laundry, interpersonal relationship, shopping, community activity, and yard work  PERSONAL FACTORS: Age, Time since onset of injury/illness/exacerbation, and 1-2 comorbidities: PMH, hx of lumbar sx are also affecting patient's functional outcome.   REHAB POTENTIAL: Good  CLINICAL DECISION MAKING: Evolving/moderate complexity  EVALUATION COMPLEXITY: Moderate  PLAN:  PT FREQUENCY: 2x/week  PT DURATION: 6 weeks  PLANNED INTERVENTIONS: 97750- Physical Performance Testing, 97110-Therapeutic exercises, 97530- Therapeutic activity, [id number]- Neuromuscular re-education, 97535- Self  Care, 02859- Manual therapy, [id number]- Gait training, [phone]- Aquatic Therapy, [phone]- Electrical stimulation (unattended), and 20560 (1-2 muscles), 20561 (3+  muscles)- Dry Needling  PLAN FOR NEXT SESSION: ABC scale, Dynamic Gait Index, 10 meter walk, HEP for balance   1:39 PM, 10/23/23 M. Kelly Shan Padgett, PT, DPT Physical Therapist-  Office Number: [phone]

## 2023-10-24 ENCOUNTER — Encounter: Payer: Self-pay | Admitting: Family Medicine

## 2023-10-24 DIAGNOSIS — M1A9XX0 Chronic gout, unspecified, without tophus (tophi): Secondary | ICD-10-CM

## 2023-10-24 MED ORDER — TRAZODONE HCL 100 MG PO TABS: 100.0000 mg | ORAL_TABLET | Freq: Every evening | ORAL | 2 refills | Status: AC | PRN

## 2023-10-24 MED ORDER — ALLOPURINOL 300 MG PO TABS: 450.0000 mg | ORAL_TABLET | Freq: Every day | ORAL | 2 refills | Status: DC

## 2023-10-24 MED ORDER — LORAZEPAM 1 MG PO TABS: 1.0000 mg | ORAL_TABLET | Freq: Three times a day (TID) | ORAL | 0 refills | Status: DC | PRN

## 2023-10-25 DIAGNOSIS — M51362 Other intervertebral disc degeneration, lumbar region with discogenic back pain and lower extremity pain: Secondary | ICD-10-CM | POA: Diagnosis not present

## 2023-10-25 DIAGNOSIS — M48061 Spinal stenosis, lumbar region without neurogenic claudication: Secondary | ICD-10-CM | POA: Diagnosis not present

## 2023-10-25 DIAGNOSIS — G4733 Obstructive sleep apnea (adult) (pediatric): Secondary | ICD-10-CM | POA: Diagnosis not present

## 2023-10-27 ENCOUNTER — Encounter: Payer: Self-pay | Admitting: Neurology

## 2023-11-02 ENCOUNTER — Encounter: Payer: Self-pay | Admitting: Physical Therapy

## 2023-11-02 ENCOUNTER — Ambulatory Visit
Admission: RE | Admit: 2023-11-02 | Discharge: 2023-11-02 | Disposition: A | Discharge status: Home / Self Care | Source: Ambulatory Visit | Attending: Neurology | Admitting: Neurology

## 2023-11-02 ENCOUNTER — Ambulatory Visit: Attending: Neurology | Admitting: Physical Therapy

## 2023-11-02 ENCOUNTER — Ambulatory Visit: Payer: Self-pay | Admitting: Neurology

## 2023-11-02 DIAGNOSIS — G629 Polyneuropathy, unspecified: Secondary | ICD-10-CM

## 2023-11-02 DIAGNOSIS — R531 Weakness: Secondary | ICD-10-CM

## 2023-11-02 DIAGNOSIS — R262 Difficulty in walking, not elsewhere classified: Secondary | ICD-10-CM | POA: Insufficient documentation

## 2023-11-02 DIAGNOSIS — R2681 Unsteadiness on feet: Secondary | ICD-10-CM | POA: Insufficient documentation

## 2023-11-02 DIAGNOSIS — R2689 Other abnormalities of gait and mobility: Secondary | ICD-10-CM | POA: Diagnosis not present

## 2023-11-02 DIAGNOSIS — W19XXXD Unspecified fall, subsequent encounter: Secondary | ICD-10-CM

## 2023-11-02 NOTE — Therapy (Signed)
 OUTPATIENT PHYSICAL THERAPY NEURO TREATMENT NOTE   Patient Name: Francisco Cowan MRN: [id number] DOB:07-Feb-1951, 73 y.o., male Today's Date: 11/02/2023   PCP: Cleotilde Lukes, DO REFERRING PROVIDER: Onita Duos, MD  END OF SESSION:  PT End of Session - 11/02/23 1058     Visit Number 2    Number of Visits 13    Date for PT Re-Evaluation 12/04/23    Authorization Type [employer]    Progress Note Due on Visit 10    PT Start Time 1104    PT Stop Time 1143    PT Time Calculation (min) 39 min    Activity Tolerance Patient tolerated treatment well    Behavior During Therapy [facility] for tasks assessed/performed           Past Medical History:  Diagnosis Date   Abnormal ECG 09/19/2017   NSR. Left axis deviation.    Alcohol abuse    Anxiety    Depression    ED (erectile dysfunction)    GERD (gastroesophageal reflux disease)    History of kidney stones    Hypertension    Moderate tetrahydrocannabinol (THC) dependence (HCC)    Photodermatitis    Spinal stenosis    Urge incontinence    Past Surgical History:  Procedure Laterality Date   CARPAL TUNNEL RELEASE Bilateral 1991   COLONOSCOPY     CYST REMOVAL TRUNK N/A 07/2014   back   ESOPHAGOGASTRODUODENOSCOPY     ROTATOR CUFF REPAIR Left 2003/2018   ROTATOR CUFF REPAIR Right 02/10/2014   Study: Echo  06/27/2016   LV size normal.  LV wall thickness normal.  LVEF 55-60%.  Diastolic filling pattern indicates impaired relaxation.  GLS test 17%.  LA is mildly dilated.  Moderate AV sclerosis without stenosis.  Trace MR.  RVSP 22.25 mmHg p   TRANSFORAMINAL LUMBAR INTERBODY FUSION (TLIF) WITH PEDICLE SCREW FIXATION 1 LEVEL N/A 11/22/2017   Procedure: TRANSFORAMINAL LUMBAR INTERBODY FUSION Lumbar four-five;  Surgeon: Burnetta Aures, MD;  Location: [facility] OR;  Service: Orthopedics;  Laterality: N/A;  4.5 hrs   ULNAR NERVE TRANSPOSITION Left 2018   Patient Active Problem List   Diagnosis Date Noted   OSA (obstructive sleep apnea)  08/30/2023   Bilateral carpal tunnel syndrome 08/10/2023   Fall 06/07/2023   Weakness 06/07/2023   Neuropathy 06/07/2023   Encounter to establish care with new doctor 01/19/2023   Obesity (BMI 30-39.9) 10/08/2018   Acute gout of left ankle 10/08/2018   Ingrown nail of second toe of left foot 10/08/2018   S/P lumbar fusion 11/22/2017   Allergy 10/12/2017   GERD (gastroesophageal reflux disease) 10/12/2017   Spinal stenosis 10/12/2017   Depression, major, single episode, mild (HCC) 10/12/2017   GAD (generalized anxiety disorder) 10/12/2017   Elevated glucose 10/11/2017   Essential hypertension 10/11/2017   Degeneration of lumbar intervertebral disc 08/01/2017   Degenerative lumbar spinal stenosis 08/01/2017   Degenerative spondylolisthesis 08/01/2017    ONSET DATE: 2 years  REFERRING DIAG: G62.9 (ICD-10-CM) - Neuropathy W19.XXXD (ICD-10-CM) - Fall, subsequent encounter R53.1 (ICD-10-CM) - Weakness  THERAPY DIAG:  Unsteadiness on feet  Other abnormalities of gait and mobility  Rationale for Evaluation and Treatment: Rehabilitation  SUBJECTIVE:  SUBJECTIVE STATEMENT: Using walking pole and got a [employer] Good foot up brace that helps my quality of life.  Going for thoracic MRI today. Pt accompanied by: self  PERTINENT HISTORY: Francisco Cowan is a 73 y.o. male   2 years history of lightheadedness when standing up, unsteady gait, falling episode Neck pain, radiating pain along his spine, left upper extremity  PAIN:  Are you having pain? No  PRECAUTIONS: Fall  RED FLAGS: Notes some instances of urinary incontinence-urgency--denies any saddle anesthesia    WEIGHT BEARING RESTRICTIONS: No  FALLS: Has patient fallen in last 6 months? Yes. Number of falls at least 6-7 times--notes falling  to the right more commonly and feels RLE weakness is more pronounced.   LIVING ENVIRONMENT: Lives with: lives with their family Lives in: House/apartment Stairs: ramp entrance, 1 level set-up Has following equipment at home: hurrycane, walking stick, RW, tub/shower w/ grab bars  PLOF: Independent, reports using AE for buttons  PATIENT GOALS: not fall down  OBJECTIVE:    TODAY'S TREATMENT: 11/02/2023 Activity Comments  70M walk:  11.31 sec = 2.9 ft/sec   DGI:  17/24 Uses walking pole; scores <19/24 indicate increased fall risk  ABC scale = 51.25 %   Corner balance ex: Feet apart/together head turns/nods with EO/EC Increased sway EC  Alt step taps to 6 step, 2 x 10 BUE>1 UE support  5x sit to stand:  16.18 sec  From 18 mat height  Sit to stand with RLE tucked posteriorly, 3 reps More difficulty due to RLE weakness   Access Code: [email or web address] URL: [email or web address] Date: 11/02/2023 Prepared by: [facility] - Outpatient  Rehab - [facility]  Exercises - Corner Balance Feet Together With Eyes Open  - 1 x daily - 7 x weekly - 3 sets - 5-10 reps - Corner Balance Feet Together With Eyes Closed  - 1 x daily - 7 x weekly - 3 sets - 5-10 reps - Sit to Stand  - 1 x daily - 7 x weekly - 3 sets - 5 reps - Staggered Stance Forward Backward Weight Shift with Counter Support  - 1 x daily - 7 x weekly - 3 sets - 10 reps  PATIENT EDUCATION: Education details: HEP initiated-see above Person educated: Patient Education method: Explanation, Demonstration, and Handouts Education comprehension: verbalized understanding, returned demonstration, and needs further education  Note: Objective measures were completed at Evaluation unless otherwise noted.  DIAGNOSTIC FINDINGS:   Per Dr. 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, only mild chronic lumbosacral radiculopathy, fairly symmetric left and right,             Referred to physical therapy             Get MRI cervical from [facility]  Blood pressure lying down 136/78, heart rate of 60 Sitting up 139/84 heart rate of 64 Standing up 127/74 heart rate of 62 Standing up for 1 minute 124/77 heart rate of  60  COGNITION: Overall cognitive status: Within functional limits for tasks assessed   SENSATION: Notes a stocking/glove distrubution of numbness/tingling of extremities   COORDINATION: Difficulty with rapid alternating movements, RUE> LUE    MUSCLE TONE: possibly 2 beat clonus right ankle      POSTURE: No Significant postural limitations  LOWER EXTREMITY ROM:     WNL  LOWER EXTREMITY MMT:    Strength is grossly 5/5 to seated resisted tests/myotomes BLE BED MOBILITY:  Independent  TRANSFERS: Sit to stand: Complete Independence  Assistive device utilized: None     Stand to sit: Complete Independence  Assistive device utilized: None     Chair to chair: Complete Independence  Assistive device utilized: None       RAMP:    CURB:  Not tested  STAIRS: Not tested GAIT: Findings: Gait Characteristics: wide BOS and Comments: unsteady with turns  FUNCTIONAL TESTS:  5 times sit to stand: TBD 10 meter walk test: TBD Berg Balance Test: 46/56 Dynamic Gait Index: TBD  M-CTSIB  Condition 1: Firm Surface, EO 30 Sec, Mild Sway  Condition 2: Firm Surface, EC 30 Sec, Moderate Sway  Condition 3: Foam Surface, EO 30 Sec, Mild Sway  Condition 4: Foam Surface, EC 18 Sec, Moderate and Severe Sway     PATIENT SURVEYS:  ABC scale: TBD                                                                                                                               TREATMENT DATE: 10/23/23    PATIENT EDUCATION: Education details: rationale of PT intervention Person educated: Patient Education method: Explanation Education comprehension: verbalized understanding  HOME EXERCISE PROGRAM: TBD  GOALS: Goals reviewed with patient? Yes  SHORT TERM GOALS: Target date: 11/13/2023    Patient will be independent in HEP to improve functional outcomes Baseline: Goal status: INITIAL  2.  Demo improved postural control per mild sway x 30 sec condition 4 M-CTSIB for improved safety during ADL Baseline: 18 sec mod-severe Goal status: INITIAL  3.  Demo improved standing balance per score 50/56 Berg Balance Test for improved safety during ADL Baseline: 46/56 Goal status: INITIAL    LONG TERM GOALS: Target date: 12/04/2023    Dynamic Gait Index score to improve to 20/24 for decreased fall risk. Baseline: 17/24 Goal status: INITIAL  2.  ABC scale to improve to at least 60% for improved balance confidence. Baseline: 51.25% Goal status: INITIAL  3.  Modified independent ambulation uneven surfaces to improve safety in community Baseline: reports avoiding Goal status: INITIAL  4.  Gait speed, 10 meter walk Baseline: 2.9 ft/sec Goal status: DEFERRED, 11/02/2023    ASSESSMENT:  CLINICAL IMPRESSION: Pt presents today and reports he is going for thoracic spine MRI later today (per neurologist order). Looked at DGI (score 17/24), FTSTS (>16 sec) and gait velocity scores.  He is at fall risk per DGI and FTSTS scores.  Skilled PT session  focused on initiating HEP to address balance and functional strength. Pt performs well and does not have any complaints upon leaving. Pt will continue to benefit from skilled PT towards goals for improved functional mobility and decreased fall risk.   EVAL:  Patient is a 73 y.o. male who was seen today for physical therapy evaluation and treatment for  G62.9 (ICD-10-CM) - Neuropathy W19.XXXD (ICD-10-CM) - Fall, subsequent encounter R53.1 (ICD-10-CM) - Weakness.  Demonstrates difficulty with rapid alternating coordination demands and postural unsteadiness w/ M-CTSIB testing and ambulates with wide BOS and unsteadiness in turns.  Berg Balance Test w/ low risk for falls per score 46/56 which is below normative values for age-matched community-dwelling older adults.  Pt would benefit from ongoing assessment and development of relevant strategies to improve mobility, activity tolerance, balance, and reduce risk for falls.    OBJECTIVE IMPAIRMENTS: Abnormal gait, decreased activity tolerance, decreased balance, decreased coordination, decreased endurance, decreased knowledge of use of DME, difficulty walking, impaired sensation, and impaired UE functional use.   ACTIVITY LIMITATIONS: carrying, lifting, standing, stairs, transfers, reach over head, and locomotion level  PARTICIPATION LIMITATIONS: meal prep, cleaning, laundry, interpersonal relationship, shopping, community activity, and yard work  PERSONAL FACTORS: Age, Time since onset of injury/illness/exacerbation, and 1-2 comorbidities: PMH, hx of lumbar sx are also affecting patient's functional outcome.   REHAB POTENTIAL: Good  CLINICAL DECISION MAKING: Evolving/moderate complexity  EVALUATION COMPLEXITY: Moderate  PLAN:  PT FREQUENCY: 2x/week  PT DURATION: 6 weeks  PLANNED INTERVENTIONS: 97750- Physical Performance Testing, 97110-Therapeutic exercises, 97530- Therapeutic activity, 97112- Neuromuscular re-education, 97535- Self Care, 02859- Manual therapy, [phone]- Gait training, [phone]- Aquatic Therapy, [phone]- Electrical stimulation (unattended), and 20560 (1-2 muscles), 20561 (3+ muscles)- Dry Needling  PLAN FOR NEXT SESSION: Review and progress HEP for balance, RLE strength   Greig Anon, PT 11/02/23 1:43 PM Phone: [phone] Fax: [phone]  [facility] Re[facility] at  [facility] [address] [place], [place] [address] Phone # [phone] Fax # [phone]

## 2023-11-07 ENCOUNTER — Ambulatory Visit: Admitting: Physical Therapy

## 2023-11-07 ENCOUNTER — Encounter: Payer: Self-pay | Admitting: Physical Therapy

## 2023-11-07 DIAGNOSIS — R2689 Other abnormalities of gait and mobility: Secondary | ICD-10-CM

## 2023-11-07 DIAGNOSIS — R262 Difficulty in walking, not elsewhere classified: Secondary | ICD-10-CM | POA: Diagnosis not present

## 2023-11-07 DIAGNOSIS — R2681 Unsteadiness on feet: Secondary | ICD-10-CM

## 2023-11-07 NOTE — Therapy (Signed)
 OUTPATIENT PHYSICAL THERAPY NEURO TREATMENT NOTE   Patient Name: Francisco Cowan MRN: [id number] DOB:11-Sep-1950, 73 y.o., male Today's Date: 11/07/2023   PCP: Cleotilde Lukes, DO REFERRING PROVIDER: Onita Duos, MD  END OF SESSION:  PT End of Session - 11/07/23 1225     Visit Number 3    Number of Visits 13    Date for PT Re-Evaluation 12/04/23    Authorization Type [employer] Medicare    Progress Note Due on Visit 10    PT Start Time 1234    PT Stop Time 1314    PT Time Calculation (min) 40 min    Activity Tolerance Patient tolerated treatment well    Behavior During Therapy [facility] for tasks assessed/performed           Past Medical History:  Diagnosis Date   Abnormal ECG 09/19/2017   NSR. Left axis deviation.    Alcohol abuse    Anxiety    Depression    ED (erectile dysfunction)    GERD (gastroesophageal reflux disease)    History of kidney stones    Hypertension    Moderate tetrahydrocannabinol (THC) dependence (HCC)    Photodermatitis    Spinal stenosis    Urge incontinence    Past Surgical History:  Procedure Laterality Date   CARPAL TUNNEL RELEASE Bilateral 1991   COLONOSCOPY     CYST REMOVAL TRUNK N/A 07/2014   back   ESOPHAGOGASTRODUODENOSCOPY     ROTATOR CUFF REPAIR Left 2003/2018   ROTATOR CUFF REPAIR Right 02/10/2014   Study: Echo  06/27/2016   LV size normal.  LV wall thickness normal.  LVEF 55-60%.  Diastolic filling pattern indicates impaired relaxation.  GLS test 17%.  LA is mildly dilated.  Moderate AV sclerosis without stenosis.  Trace MR.  RVSP 22.25 mmHg p   TRANSFORAMINAL LUMBAR INTERBODY FUSION (TLIF) WITH PEDICLE SCREW FIXATION 1 LEVEL N/A 11/22/2017   Procedure: TRANSFORAMINAL LUMBAR INTERBODY FUSION Lumbar four-five;  Surgeon: Burnetta Aures, MD;  Location: [facility] OR;  Service: Orthopedics;  Laterality: N/A;  4.5 hrs   ULNAR NERVE TRANSPOSITION Left 2018   Patient Active Problem List   Diagnosis Date Noted   OSA (obstructive sleep apnea)  08/30/2023   Bilateral carpal tunnel syndrome 08/10/2023   Fall 06/07/2023   Weakness 06/07/2023   Neuropathy 06/07/2023   Encounter to establish care with new doctor 01/19/2023   Obesity (BMI 30-39.9) 10/08/2018   Acute gout of left ankle 10/08/2018   Ingrown nail of second toe of left foot 10/08/2018   S/P lumbar fusion 11/22/2017   Allergy 10/12/2017   GERD (gastroesophageal reflux disease) 10/12/2017   Spinal stenosis 10/12/2017   Depression, major, single episode, mild (HCC) 10/12/2017   GAD (generalized anxiety disorder) 10/12/2017   Elevated glucose 10/11/2017   Essential hypertension 10/11/2017   Degeneration of lumbar intervertebral disc 08/01/2017   Degenerative lumbar spinal stenosis 08/01/2017   Degenerative spondylolisthesis 08/01/2017    ONSET DATE: 2 years  REFERRING DIAG: G62.9 (ICD-10-CM) - Neuropathy W19.XXXD (ICD-10-CM) - Fall, subsequent encounter R53.1 (ICD-10-CM) - Weakness  THERAPY DIAG:  Unsteadiness on feet  Other abnormalities of gait and mobility  Rationale for Evaluation and Treatment: Rehabilitation  SUBJECTIVE:  SUBJECTIVE STATEMENT: Thoracic MRI showed normal age related changes.  No nerve impingement.  No falls, Pain and unsteadiness comes and goes.  The frustrating thing is that I don't know when I can count on the R LE strength.  The right side is the problem side-upper and lower body. Pt accompanied by: self  PERTINENT HISTORY: Francisco Cowan is a 73 y.o. male   2 years history of lightheadedness when standing up, unsteady gait, falling episode Neck pain, radiating pain along his spine, left upper extremity  PAIN:  Are you having pain? No  PRECAUTIONS: Fall  RED FLAGS: Notes some instances of urinary incontinence-urgency--denies any saddle  anesthesia    WEIGHT BEARING RESTRICTIONS: No  FALLS: Has patient fallen in last 6 months? Yes. Number of falls at least 6-7 times--notes falling to the right more commonly and feels RLE weakness is more pronounced.   LIVING ENVIRONMENT: Lives with: lives with their family Lives in: House/apartment Stairs: ramp entrance, 1 level set-up Has following equipment at home: hurrycane, walking stick, RW, tub/shower w/ grab bars  PLOF: Independent, reports using AE for buttons  PATIENT GOALS: not fall down  OBJECTIVE:    TODAY'S TREATMENT: 11/07/2023 Activity Comments  Reviewed stagger stance sit to stand, RLE tucked   Seated hamstring curl, 2 x 10 10# at weightrack  Standing strengthening: Hip abduction 2 x 10 Hip extension, 2 x 10 Hip/knee flexion 2 x 10 3# BLEs-pt reports feeling more fatigued in RLE  Sidestepping along counter, 4 reps 3# BLE  Forward/back walking along counter, 4 reps 3# BLE  Seated LAQ, 2 x 10 3# RLE  Standing RLE SLS with step taps to 4 and 6 steps, then into runner's stretch position, BUE support Difficulty with coordination of RLE bending/straightening     TODAY'S TREATMENT: 11/02/2023 Activity Comments  61M walk:  11.31 sec = 2.9 ft/sec   DGI:  17/24 Uses walking pole; scores <19/24 indicate increased fall risk  ABC scale = 51.25 %   Corner balance ex: Feet apart/together head turns/nods with EO/EC Increased sway EC  Alt step taps to 6 step, 2 x 10 BUE>1 UE support  5x sit to stand:  16.18 sec  From 18 mat height  Sit to stand with RLE tucked posteriorly, 3 reps More difficulty due to RLE weakness   Access Code: [email or web address] URL: [email or web address] Date: 11/02/2023 Prepared by: [facility] - Outpatient  Rehab - [facility]  Exercises - Corner Balance Feet Together With Eyes Open  - 1 x daily - 7 x weekly - 3 sets - 5-10 reps - Corner Balance Feet Together With Eyes Closed  - 1 x daily - 7 x weekly - 3 sets - 5-10 reps - Sit to  Stand  - 1 x daily - 7 x weekly - 3 sets - 5 reps - Staggered Stance Forward Backward Weight Shift with Counter Support  - 1 x daily - 7 x weekly - 3 sets - 10 reps  PATIENT EDUCATION: Education details: Continue current HEP Person educated: Patient Education method: Explanation, Demonstration, and Handouts Education comprehension: verbalized understanding, returned demonstration, and needs further education   ----------------------------------------------------- Note: Objective measures were completed at Evaluation unless otherwise noted.  DIAGNOSTIC FINDINGS:   Per Dr. 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, only mild chronic lumbosacral radiculopathy, fairly symmetric left and right,             Referred to physical therapy             Get MRI cervical from [facility]  Blood pressure lying down 136/78, heart rate of 60 Sitting up 139/84 heart rate of 64 Standing up 127/74 heart rate of 62 Standing up for 1 minute 124/77 heart rate of 60  COGNITION: Overall cognitive status: Within functional limits for tasks assessed   SENSATION: Notes a stocking/glove distrubution of numbness/tingling of extremities   COORDINATION: Difficulty with rapid alternating movements, RUE> LUE    MUSCLE TONE: possibly 2 beat clonus right ankle      POSTURE: No Significant postural limitations  LOWER EXTREMITY ROM:     WNL  LOWER EXTREMITY MMT:    Strength is grossly 5/5 to seated resisted tests/myotomes BLE BED MOBILITY:  Independent  TRANSFERS: Sit to stand: Complete  Independence  Assistive device utilized: None     Stand to sit: Complete Independence  Assistive device utilized: None     Chair to chair: Complete Independence  Assistive device utilized: None       RAMP:    CURB:  Not tested  STAIRS: Not tested GAIT: Findings: Gait Characteristics: wide BOS and Comments: unsteady with turns  FUNCTIONAL TESTS:  5 times sit to stand: TBD 10 meter walk test: TBD Berg Balance Test: 46/56 Dynamic Gait Index: TBD  M-CTSIB  Condition 1: Firm Surface, EO 30 Sec, Mild Sway  Condition 2: Firm Surface, EC 30 Sec, Moderate Sway  Condition 3: Foam Surface, EO 30 Sec, Mild Sway  Condition 4: Foam Surface, EC 18 Sec, Moderate and Severe Sway     PATIENT SURVEYS:  ABC scale: TBD                                                                                                                              TREATMENT DATE: 10/23/23    PATIENT EDUCATION: Education details: rationale of PT intervention Person educated: Patient Education method: Explanation Education comprehension: verbalized understanding  HOME EXERCISE PROGRAM: TBD  GOALS: Goals reviewed with patient? Yes  SHORT TERM GOALS: Target date: 11/13/2023    Patient will be independent in HEP to improve functional outcomes Baseline: Goal status: INITIAL  2.  Demo improved postural control per mild sway x 30 sec condition 4 M-CTSIB for improved safety during ADL Baseline: 18 sec mod-severe Goal status: INITIAL  3.  Demo improved standing balance per score 50/56 Berg Balance Test for improved safety during ADL Baseline: 46/56 Goal status: INITIAL  LONG TERM GOALS: Target date: 12/04/2023    Dynamic Gait Index score to improve to 20/24 for decreased fall risk. Baseline: 17/24 Goal status: INITIAL  2.  ABC scale to improve to at least 60% for improved balance confidence. Baseline: 51.25% Goal status: INITIAL  3.  Modified independent ambulation uneven surfaces to improve  safety in community Baseline: reports avoiding Goal status: INITIAL  4.  Gait speed, 10 meter walk Baseline: 2.9 ft/sec Goal status: DEFERRED, 11/02/2023    ASSESSMENT:  CLINICAL IMPRESSION: Pt presents today with no new complaints, but does report that the thoracic MRI was unremarkable for any nerve involvement.  He continues to report variable weakness in RLE. Skilled PT session focused on strengthening and balance. He fatigues in RLE with decreased coordination with strengthening exercises; with dynamic balance and gait, he improves with repetition.  Pt will continue to benefit from skilled PT towards goals for improved functional mobility and decreased fall risk.    EVAL:  Patient is a 73 y.o. male who was seen today for physical therapy evaluation and treatment for G62.9 (ICD-10-CM) - Neuropathy W19.XXXD (ICD-10-CM) - Fall, subsequent encounter R53.1 (ICD-10-CM) - Weakness.  Demonstrates difficulty with rapid alternating coordination demands and postural unsteadiness w/ M-CTSIB testing and ambulates with wide BOS and unsteadiness in turns.  Berg Balance Test w/ low risk for falls per score 46/56 which is below normative values for age-matched community-dwelling older adults.  Pt would benefit from ongoing assessment and development of relevant strategies to improve mobility, activity tolerance, balance, and reduce risk for falls.    OBJECTIVE IMPAIRMENTS: Abnormal gait, decreased activity tolerance, decreased balance, decreased coordination, decreased endurance, decreased knowledge of use of DME, difficulty walking, impaired sensation, and impaired UE functional use.   ACTIVITY LIMITATIONS: carrying, lifting, standing, stairs, transfers, reach over head, and locomotion level  PARTICIPATION LIMITATIONS: meal prep, cleaning, laundry, interpersonal relationship, shopping, community activity, and yard work  PERSONAL FACTORS: Age, Time since onset of injury/illness/exacerbation, and 1-2  comorbidities: PMH, hx of lumbar sx are also affecting patient's functional outcome.   REHAB POTENTIAL: Good  CLINICAL DECISION MAKING: Evolving/moderate complexity  EVALUATION COMPLEXITY: Moderate  PLAN:  PT FREQUENCY: 2x/week  PT DURATION: 6 weeks  PLANNED INTERVENTIONS: 97750- Physical Performance Testing, 97110-Therapeutic exercises, 97530- Therapeutic activity, 97112- Neuromuscular re-education, 97535- Self Care, 02859- Manual therapy, [phone]- Gait training, [phone]- Aquatic Therapy, [phone]- Electrical stimulation (unattended), and 20560 (1-2 muscles), 20561 (3+ muscles)- Dry Needling  PLAN FOR NEXT SESSION: SLS on RLE and add to HEP; progress HEP for balance, RLE strength   Greig Anon, PT 11/07/23 1:13 PM Phone: [phone] Fax: [phone]  [facility] Rehab at [facility] [address], [address] [place], [place] [address] Phone # [phone] Fax # [phone]

## 2023-11-09 ENCOUNTER — Encounter: Payer: Self-pay | Admitting: Physical Therapy

## 2023-11-09 ENCOUNTER — Ambulatory Visit: Admitting: Physical Therapy

## 2023-11-09 DIAGNOSIS — R2681 Unsteadiness on feet: Secondary | ICD-10-CM | POA: Diagnosis not present

## 2023-11-09 DIAGNOSIS — R2689 Other abnormalities of gait and mobility: Secondary | ICD-10-CM

## 2023-11-09 DIAGNOSIS — R262 Difficulty in walking, not elsewhere classified: Secondary | ICD-10-CM

## 2023-11-09 NOTE — Therapy (Signed)
 OUTPATIENT PHYSICAL THERAPY NEURO TREATMENT NOTE   Patient Name: Francisco Cowan MRN: [id number] DOB:[birth year], 73 y.o., male Today's Date: 11/09/2023   PCP: Francisco Lukes, DO REFERRING PROVIDER: Onita Duos, MD  END OF SESSION:  PT End of Session - 11/09/23 1014     Visit Number 4    Number of Visits 13    Date for PT Re-Evaluation 12/04/23    Authorization Type [employer]    Progress Note Due on Visit 10    PT Start Time 1015    PT Stop Time 1055    PT Time Calculation (min) 40 min    Activity Tolerance Patient tolerated treatment well    Behavior During Therapy Francisco Cowan for tasks assessed/performed            Past Medical History:  Diagnosis Date   Abnormal ECG 09/19/2017   NSR. Left axis deviation.    Alcohol abuse    Anxiety    Depression    ED (erectile dysfunction)    GERD (gastroesophageal reflux disease)    History of kidney stones    Hypertension    Moderate tetrahydrocannabinol (THC) dependence (HCC)    Photodermatitis    Spinal stenosis    Urge incontinence    Past Surgical History:  Procedure Laterality Date   CARPAL TUNNEL RELEASE Bilateral 1991   COLONOSCOPY     CYST REMOVAL TRUNK N/A 07/2014   back   ESOPHAGOGASTRODUODENOSCOPY     ROTATOR CUFF REPAIR Left 2003/2018   ROTATOR CUFF REPAIR Right 02/10/2014   Study: Echo  06/27/2016   LV size normal.  LV wall thickness normal.  LVEF 55-60%.  Diastolic filling pattern indicates impaired relaxation.  GLS test 17%.  LA is mildly dilated.  Moderate AV sclerosis without stenosis.  Trace MR.  RVSP 22.25 mmHg p   TRANSFORAMINAL LUMBAR INTERBODY FUSION (TLIF) WITH PEDICLE SCREW FIXATION 1 LEVEL N/A 11/22/2017   Procedure: TRANSFORAMINAL LUMBAR INTERBODY FUSION Lumbar four-five;  Surgeon: Francisco Aures, MD;  Location: [facility] OR;  Service: Orthopedics;  Laterality: N/A;  4.5 hrs   ULNAR NERVE TRANSPOSITION Left 2018   Patient Active Problem List   Diagnosis Date Noted   OSA (obstructive sleep apnea)  08/30/2023   Bilateral carpal tunnel syndrome 08/10/2023   Fall 06/07/2023   Weakness 06/07/2023   Neuropathy 06/07/2023   Encounter to establish care with new doctor 01/19/2023   Obesity (BMI 30-39.9) 10/08/2018   Acute gout of left ankle 10/08/2018   Ingrown nail of second toe of left foot 10/08/2018   S/P lumbar fusion 11/22/2017   Allergy 10/12/2017   GERD (gastroesophageal reflux disease) 10/12/2017   Spinal stenosis 10/12/2017   Depression, major, single episode, mild (HCC) 10/12/2017   GAD (generalized anxiety disorder) 10/12/2017   Elevated glucose 10/11/2017   Essential hypertension 10/11/2017   Degeneration of lumbar intervertebral disc 08/01/2017   Degenerative lumbar spinal stenosis 08/01/2017   Degenerative spondylolisthesis 08/01/2017    ONSET DATE: 2 years  REFERRING DIAG: G62.9 (ICD-10-CM) - Neuropathy W19.XXXD (ICD-10-CM) - Fall, subsequent encounter R53.1 (ICD-10-CM) - Weakness  THERAPY DIAG:  Unsteadiness on feet  Other abnormalities of gait and mobility  Difficulty in walking, not elsewhere classified  Rationale for Evaluation and Treatment: Rehabilitation  SUBJECTIVE:  SUBJECTIVE STATEMENT: Reports nothing new or different. Endorses no falls. Felt fine after last session. States HEP has been going okay. Pt states today his left arm is numb -- states he has neuropathy.  Pt accompanied by: self  PERTINENT HISTORY: Francisco Cowan is a 73 y.o. male   2 years history of lightheadedness when standing up, unsteady gait, falling episode Neck pain, radiating pain along his spine, left upper extremity  PAIN:  Are you having pain? No  PRECAUTIONS: Fall  RED FLAGS: Notes some instances of urinary incontinence-urgency--denies any saddle anesthesia    WEIGHT BEARING  RESTRICTIONS: No  FALLS: Has patient fallen in last 6 months? Yes. Number of falls at least 6-7 times--notes falling to the right more commonly and feels RLE weakness is more pronounced.   LIVING ENVIRONMENT: Lives with: lives with their family Lives in: House/apartment Stairs: ramp entrance, 1 level set-up Has following equipment at home: hurrycane, walking stick, RW, tub/shower w/ grab bars  PLOF: Independent, reports using AE for buttons  PATIENT GOALS: not fall down  OBJECTIVE:  TODAY'S TREATMENT: 11/09/2023 Activity Comments  Scifit L2-L3 x 8 min UEs/LEs   Standing strengthening: Hip abduction 2 x 10 Hip extension, 2 x 10 SLR 2 x 10  Hamstring curl 2 x 10 3# BLEs-pt reports feeling more fatigued in RLE  Decreased eccentric control on R  EO static balance feet together 2x30   Rocker board laterally side<>side weight shift x10   Standing alternating knee flexion + heel raise R&L x10 Working on weight shifting       TODAY'S TREATMENT: 11/07/2023 Activity Comments  Reviewed stagger stance sit to stand, RLE tucked   Seated hamstring curl, 2 x 10 10# at weightrack  Standing strengthening: Hip abduction 2 x 10 Hip extension, 2 x 10 Hip/knee flexion 2 x 10 3# BLEs-pt reports feeling more fatigued in RLE  Sidestepping along counter, 4 reps 3# BLE  Forward/back walking along counter, 4 reps 3# BLE  Seated LAQ, 2 x 10 3# RLE  Standing RLE SLS with step taps to 4 and 6 steps, then into runner's stretch position, BUE support Difficulty with coordination of RLE bending/straightening    HOME EXERCISE PROGRAM: Access Code: [email or web address] URL: [email or web address] Date: 11/02/2023 Prepared by: [facility] - Outpatient  Rehab - [facility]  Exercises - Corner Balance Feet Together With Eyes Open  - 1 x daily - 7 x weekly - 3 sets - 5-10 reps - Corner Balance Feet Together With Eyes Closed  - 1 x daily - 7 x weekly - 3 sets - 5-10 reps - Sit to Stand  - 1 x daily  - 7 x weekly - 3 sets - 5 reps - Staggered Stance Forward Backward Weight Shift with Counter Support  - 1 x daily - 7 x weekly - 3 sets - 10 reps  PATIENT EDUCATION: Education details: Continue current HEP Person educated: Patient Education method: Explanation, Demonstration, and Handouts Education comprehension: verbalized understanding, returned demonstration, and needs further education   ----------------------------------------------------- Note: Objective measures were completed at Evaluation unless otherwise noted.  DIAGNOSTIC FINDINGS:   Per Dr. 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, only mild chronic lumbosacral radiculopathy, fairly symmetric left and right,             Referred to physical therapy             Get MRI cervical from [facility]  Blood pressure lying down 136/78, heart rate of 60 Sitting up 139/84 heart rate of 64 Standing up 127/74 heart rate of 62 Standing up for 1 minute 124/77 heart rate of 60  COGNITION: Overall cognitive status: Within functional limits for tasks assessed   SENSATION: Notes a stocking/glove distrubution of numbness/tingling of extremities   COORDINATION: Difficulty with rapid alternating movements, RUE> LUE  MUSCLE TONE: possibly 2 beat clonus right ankle  POSTURE: No Significant postural limitations  LOWER EXTREMITY ROM:     WNL  LOWER EXTREMITY MMT:    Strength is grossly 5/5 to seated resisted tests/myotomes BLE BED MOBILITY:  Independent  TRANSFERS: Sit to stand: Complete Independence  Assistive device  utilized: None     Stand to sit: Complete Independence  Assistive device utilized: None     Chair to chair: Complete Independence  Assistive device utilized: None       RAMP:    CURB:  Not tested  STAIRS: Not tested GAIT: Findings: Gait Characteristics: wide BOS and Comments: unsteady with turns  FUNCTIONAL TESTS:  Eval: 5 times sit to stand: TBD 10 meter walk test: TBD Berg Balance Test: 46/56 Dynamic Gait Index: TBD  M-CTSIB  Condition 1: Firm Surface, EO 30 Sec, Mild Sway  Condition 2: Firm Surface, EC 30 Sec, Moderate Sway  Condition 3: Foam Surface, EO 30 Sec, Mild Sway  Condition 4: Foam Surface, EC 18 Sec, Moderate and Severe Sway   11/02/23: 78M walk:  11.31 sec = 2.9 ft/sec   DGI:  17/24 Uses walking pole; scores <19/24 indicate increased fall risk  ABC scale = 51.25 %   5x sit to stand:  16.18 sec  From 18 mat height    PATIENT SURVEYS:  ABC scale: TBD                                                                                                                         GOALS: Goals reviewed with patient? Yes  SHORT TERM GOALS: Target date: 11/13/2023    Patient will be independent in HEP to improve functional outcomes Baseline: Goal status: IN PROGRESS  2.  Demo improved postural control per mild sway x 30 sec condition 4 M-CTSIB for improved safety during ADL Baseline: 18 sec mod-severe Goal status: IN PROGRESS  3.  Demo improved standing balance per score 50/56 Berg Balance Test for improved safety during ADL Baseline: 46/56 Goal status: IN PROGRESS    LONG TERM GOALS: Target date: 12/04/2023    Dynamic Gait Index  score to improve to 20/24 for decreased fall risk. Baseline: 17/24 Goal status: INITIAL  2.  ABC scale to improve to at least 60% for improved balance confidence. Baseline: 51.25% Goal status: INITIAL  3.  Modified independent ambulation uneven surfaces to improve safety in community Baseline: reports avoiding Goal status:  INITIAL  4.  Gait speed, 10 meter walk Baseline: 2.9 ft/sec Goal status: DEFERRED, 11/02/2023    ASSESSMENT:  CLINICAL IMPRESSION: Treatment focused on continuing bilat LE strengthening with focus on decreasing R LE compensatory patterns. Extensor lag noticeable with SLR and pt was challenged to maintain eccentric quad control with bring leg down. Worked on increasing R LE stance/weight shift as pt tends to place the majority of his weight on his L LE.    EVAL:  Patient is a 73 y.o. male who was seen today for physical therapy evaluation and treatment for G62.9 (ICD-10-CM) - Neuropathy W19.XXXD (ICD-10-CM) - Fall, subsequent encounter R53.1 (ICD-10-CM) - Weakness.  Demonstrates difficulty with rapid alternating coordination demands and postural unsteadiness w/ M-CTSIB testing and ambulates with wide BOS and unsteadiness in turns.  Berg Balance Test w/ low risk for falls per score 46/56 which is below normative values for age-matched community-dwelling older adults.  Pt would benefit from ongoing assessment and development of relevant strategies to improve mobility, activity tolerance, balance, and reduce risk for falls.    OBJECTIVE IMPAIRMENTS: Abnormal gait, decreased activity tolerance, decreased balance, decreased coordination, decreased endurance, decreased knowledge of use of DME, difficulty walking, impaired sensation, and impaired UE functional use.   ACTIVITY LIMITATIONS: carrying, lifting, standing, stairs, transfers, reach over head, and locomotion level  PARTICIPATION LIMITATIONS: meal prep, cleaning, laundry, interpersonal relationship, shopping, community activity, and yard work  PERSONAL FACTORS: Age, Time since onset of injury/illness/exacerbation, and 1-2 comorbidities: PMH, hx of lumbar sx are also affecting patient's functional outcome.   REHAB POTENTIAL: Good  CLINICAL DECISION MAKING: Evolving/moderate complexity  EVALUATION COMPLEXITY: Moderate  PLAN:  PT  FREQUENCY: 2x/week  PT DURATION: 6 weeks  PLANNED INTERVENTIONS: 97750- Physical Performance Testing, 97110-Therapeutic exercises, 97530- Therapeutic activity, 97112- Neuromuscular re-education, 97535- Self Care, 02859- Manual therapy, [phone]- Gait training, [phone]- Aquatic Therapy, [phone]- Electrical stimulation (unattended), and 20560 (1-2 muscles), 20561 (3+ muscles)- Dry Needling  PLAN FOR NEXT SESSION: Increase single limb stance/weightbearing on RLE and progress towards SLS; progress HEP for balance, RLE strength   Abdullah Rizzi April Ma L Tatayana Beshears, PT, DPT 11/09/23 10:14 AM Phone: [phone] Fax: [phone]  [facility] at [facility] [address], [address] [place], [place] [address] Phone # [phone] Fax # [phone]

## 2023-11-14 ENCOUNTER — Ambulatory Visit: Admitting: Physical Therapy

## 2023-11-16 ENCOUNTER — Encounter: Payer: Self-pay | Admitting: Physical Therapy

## 2023-11-16 ENCOUNTER — Ambulatory Visit: Admitting: Physical Therapy

## 2023-11-16 DIAGNOSIS — R2689 Other abnormalities of gait and mobility: Secondary | ICD-10-CM | POA: Diagnosis not present

## 2023-11-16 DIAGNOSIS — R2681 Unsteadiness on feet: Secondary | ICD-10-CM

## 2023-11-16 DIAGNOSIS — R262 Difficulty in walking, not elsewhere classified: Secondary | ICD-10-CM | POA: Diagnosis not present

## 2023-11-16 NOTE — Therapy (Signed)
 OUTPATIENT PHYSICAL THERAPY NEURO TREATMENT NOTE   Patient Name: Francisco Cowan MRN: [id number] DOB:10-10-1950, 73 y.o., male Today's Date: 11/16/2023   PCP: Francisco Lukes, DO REFERRING PROVIDER: Onita Duos, MD  END OF SESSION:  PT End of Session - 11/16/23 1151     Visit Number 5    Number of Visits 13    Date for Recertification  12/04/23    Authorization Type [employer]    Progress Note Due on Visit 10    PT Start Time 1151    PT Stop Time 1231    PT Time Calculation (min) 40 min    Activity Tolerance Patient tolerated treatment well    Behavior During Therapy [facility] for tasks assessed/performed             Past Medical History:  Diagnosis Date   Abnormal ECG 09/19/2017   NSR. Left axis deviation.    Alcohol abuse    Anxiety    Depression    ED (erectile dysfunction)    GERD (gastroesophageal reflux disease)    History of kidney stones    Hypertension    Moderate tetrahydrocannabinol (THC) dependence (HCC)    Photodermatitis    Spinal stenosis    Urge incontinence    Past Surgical History:  Procedure Laterality Date   CARPAL TUNNEL RELEASE Bilateral 1991   COLONOSCOPY     CYST REMOVAL TRUNK N/A 07/2014   back   ESOPHAGOGASTRODUODENOSCOPY     ROTATOR CUFF REPAIR Left 2003/2018   ROTATOR CUFF REPAIR Right 02/10/2014   Study: Echo  06/27/2016   LV size normal.  LV wall thickness normal.  LVEF 55-60%.  Diastolic filling pattern indicates impaired relaxation.  GLS test 17%.  LA is mildly dilated.  Moderate AV sclerosis without stenosis.  Trace MR.  RVSP 22.25 mmHg p   TRANSFORAMINAL LUMBAR INTERBODY FUSION (TLIF) WITH PEDICLE SCREW FIXATION 1 LEVEL N/A 11/22/2017   Procedure: TRANSFORAMINAL LUMBAR INTERBODY FUSION Lumbar four-five;  Surgeon: Francisco Aures, MD;  Location: [facility] OR;  Service: Orthopedics;  Laterality: N/A;  4.5 hrs   ULNAR NERVE TRANSPOSITION Left 2018   Patient Active Problem List   Diagnosis Date Noted   OSA (obstructive sleep  apnea) 08/30/2023   Bilateral carpal tunnel syndrome 08/10/2023   Fall 06/07/2023   Weakness 06/07/2023   Neuropathy 06/07/2023   Encounter to establish care with new doctor 01/19/2023   Obesity (BMI 30-39.9) 10/08/2018   Acute gout of left ankle 10/08/2018   Ingrown nail of second toe of left foot 10/08/2018   S/P lumbar fusion 11/22/2017   Allergy 10/12/2017   GERD (gastroesophageal reflux disease) 10/12/2017   Spinal stenosis 10/12/2017   Depression, major, single episode, mild (HCC) 10/12/2017   GAD (generalized anxiety disorder) 10/12/2017   Elevated glucose 10/11/2017   Essential hypertension 10/11/2017   Degeneration of lumbar intervertebral disc 08/01/2017   Degenerative lumbar spinal stenosis 08/01/2017   Degenerative spondylolisthesis 08/01/2017    ONSET DATE: 2 years  REFERRING DIAG: G62.9 (ICD-10-CM) - Neuropathy W19.XXXD (ICD-10-CM) - Fall, subsequent encounter R53.1 (ICD-10-CM) - Weakness  THERAPY DIAG:  Unsteadiness on feet  Other abnormalities of gait and mobility  Rationale for Evaluation and Treatment: Rehabilitation  SUBJECTIVE:  SUBJECTIVE STATEMENT: Did a lot of driving to [place]  and [facility] over days.  No falls.  Still having some pain-L UE and RUE, RLE, lower back.  I'm to go to an organization to get a foot brace. (From orthopedic-pt states he thinks it may be [facility]) Pt accompanied by: self  PERTINENT HISTORY: Francisco Cowan is a 73 y.o. male   2 years history of lightheadedness when standing up, unsteady gait, falling episode Neck pain, radiating pain along his spine, left upper extremity  PAIN:  Are you having pain? No and Yes: NPRS scale: 3-4/10 Pain location: RUE and LUE, RLE, shoulder Pain description: tweaked pain Aggravating factors:  unsure Relieving factors: unsure  PRECAUTIONS: Fall  RED FLAGS: Notes some instances of urinary incontinence-urgency--denies any saddle anesthesia    WEIGHT BEARING RESTRICTIONS: No  FALLS: Has patient fallen in last 6 months? Yes. Number of falls at least 6-7 times--notes falling to the right more commonly and feels RLE weakness is more pronounced.   LIVING ENVIRONMENT: Lives with: lives with their family Lives in: House/apartment Stairs: ramp entrance, 1 level set-up Has following equipment at home: hurrycane, walking stick, RW, tub/shower w/ grab bars  PLOF: Independent, reports using AE for buttons  PATIENT GOALS: not fall down  OBJECTIVE:       TODAY'S TREATMENT: 11/16/2023 Activity Comments  NuStep, Level 4/5, 4 extremities x 4 minutes, then Level 3, BLEs only Aerobic warm up  Forward/back walking at parallel bars Sidestepping at parallel bars 3# BLEs x 1 min each  Standing strengthening: Hip abduction 2 x 10 Hip extension, 2 x 10 SLR 2 x 10  Hamstring curl 2 x 10 3# BLEs-pt reports feeling more fatigued in RLE  Decreased eccentric control on R  Forward lunge steps, 10 reps each leg BUE support and cues for technique  Forward step ups x 10 reps, each leg to 4 step BUE support  Standing balance on Airex:  feet apart/stagger stance EO/EC head turns  Light UE support  On Airex: alternating forward step taps x 10 Alternating side step taps x 10 Alternating back step taps x 10 BUE support, decreased foot clearance RLE          HOME EXERCISE PROGRAM: Access Code: [email or web address] URL: [email or web address] Date: 11/16/2023 Prepared by: [facility] - Outpatient  Rehab - [facility]  Exercises - Corner Balance Feet Together With Eyes Open  - 1 x daily - 7 x weekly - 3 sets - 5-10 reps - Corner Balance Feet Together With Eyes Closed  - 1 x daily - 7 x weekly - 3 sets - 5-10 reps - Sit to Stand  - 1 x daily - 7 x weekly - 3 sets - 5 reps - Staggered  Stance Forward Backward Weight Shift with Counter Support  - 1 x daily - 7 x weekly - 3 sets - 10 reps - Standing Hip Flexion AROM (Mirrored)  - 1 x daily - 7 x weekly - 2 sets - 10 reps - Alternating Heel Raises  - 1 x daily - 7 x weekly - 2 sets - 10 reps  PATIENT EDUCATION: Education details: Continue current HEP (above) Person educated: Patient Education method: Explanation, Demonstration, and Handouts Education comprehension: verbalized understanding, returned demonstration, and needs further education   ----------------------------------------------------- Note: Objective measures were completed at Evaluation unless otherwise noted.  DIAGNOSTIC FINDINGS:   Per Dr. 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, only mild chronic lumbosacral radiculopathy, fairly symmetric left and right,             Referred to physical therapy             Get MRI cervical from [facility]  Blood pressure lying down 136/78, heart rate of 60 Sitting up 139/84 heart rate of 64 Standing up 127/74 heart rate of 62 Standing up for 1 minute 124/77 heart rate of 60  COGNITION: Overall cognitive status: Within functional limits for tasks assessed   SENSATION: Notes a stocking/glove distrubution of numbness/tingling of extremities   COORDINATION: Difficulty with rapid alternating movements, RUE> LUE  MUSCLE TONE: possibly 2 beat clonus right ankle  POSTURE: No Significant postural limitations  LOWER EXTREMITY ROM:     WNL  LOWER EXTREMITY MMT:    Strength is grossly 5/5 to seated  resisted tests/myotomes BLE BED MOBILITY:  Independent  TRANSFERS: Sit to stand: Complete Independence  Assistive device utilized: None     Stand to sit: Complete Independence  Assistive device utilized: None     Chair to chair: Complete Independence  Assistive device utilized: None       RAMP:    CURB:  Not tested  STAIRS: Not tested GAIT: Findings: Gait Characteristics: wide BOS and Comments: unsteady with turns  FUNCTIONAL TESTS:  Eval: 5 times sit to stand: TBD 10 meter walk test: TBD Berg Balance Test: 46/56 Dynamic Gait Index: TBD  M-CTSIB  Condition 1: Firm Surface, EO 30 Sec, Mild Sway  Condition 2: Firm Surface, EC 30 Sec, Moderate Sway  Condition 3: Foam Surface, EO 30 Sec, Mild Sway  Condition 4: Foam Surface, EC 18 Sec, Moderate and Severe Sway   11/02/23: 67M walk:  11.31 sec = 2.9 ft/sec   DGI:  17/24 Uses walking pole; scores <19/24 indicate increased fall risk  ABC scale = 51.25 %   5x sit to stand:  16.18 sec  From 18 mat height    PATIENT SURVEYS:  ABC scale: TBD                                                                                                                         GOALS: Goals reviewed with patient? Yes  SHORT TERM GOALS: Target date: 11/13/2023    Patient will be independent in HEP to improve functional outcomes Baseline: Goal status: IN PROGRESS  2.  Demo improved postural control per mild sway x 30 sec condition 4 M-CTSIB for improved safety during ADL Baseline: 18 sec mod-severe Goal  status: IN PROGRESS  3.  Demo improved standing balance per score 50/56 Berg Balance Test for improved safety during ADL Baseline: 46/56 Goal status: IN PROGRESS    LONG TERM GOALS: Target date: 12/04/2023    Dynamic Gait Index score to improve to 20/24 for decreased fall risk. Baseline: 17/24 Goal status: INITIAL  2.  ABC scale to improve to at least 60% for improved balance confidence. Baseline: 51.25% Goal status:  INITIAL  3.  Modified independent ambulation uneven surfaces to improve safety in community Baseline: reports avoiding Goal status: INITIAL  4.  Gait speed, 10 meter walk Baseline: 2.9 ft/sec Goal status: DEFERRED, 11/02/2023    ASSESSMENT:  CLINICAL IMPRESSION: Pt presents today after traveling for several days.  Skilled PT session focused on strengthening plus balance activities.  With challenges to RLE with lunges and with step ups, RLE does not buckle (pt reports this happens sometimes at home, causing falls).  He does tend to use BUE support with more challenging exercises.  Pt will continue to benefit from skilled PT towards goals for improved functional mobility and decreased fall risk.   EVAL:  Patient is a 73 y.o. male who was seen today for physical therapy evaluation and treatment for G62.9 (ICD-10-CM) - Neuropathy W19.XXXD (ICD-10-CM) - Fall, subsequent encounter R53.1 (ICD-10-CM) - Weakness.  Demonstrates difficulty with rapid alternating coordination demands and postural unsteadiness w/ M-CTSIB testing and ambulates with wide BOS and unsteadiness in turns.  Berg Balance Test w/ low risk for falls per score 46/56 which is below normative values for age-matched community-dwelling older adults.  Pt would benefit from ongoing assessment and development of relevant strategies to improve mobility, activity tolerance, balance, and reduce risk for falls.    OBJECTIVE IMPAIRMENTS: Abnormal gait, decreased activity tolerance, decreased balance, decreased coordination, decreased endurance, decreased knowledge of use of DME, difficulty walking, impaired sensation, and impaired UE functional use.   ACTIVITY LIMITATIONS: carrying, lifting, standing, stairs, transfers, reach over head, and locomotion level  PARTICIPATION LIMITATIONS: meal prep, cleaning, laundry, interpersonal relationship, shopping, community activity, and yard work  PERSONAL FACTORS: Age, Time since onset of  injury/illness/exacerbation, and 1-2 comorbidities: PMH, hx of lumbar sx are also affecting patient's functional outcome.   REHAB POTENTIAL: Good  CLINICAL DECISION MAKING: Evolving/moderate complexity  EVALUATION COMPLEXITY: Moderate  PLAN:  PT FREQUENCY: 2x/week  PT DURATION: 6 weeks  PLANNED INTERVENTIONS: 97750- Physical Performance Testing, 97110-Therapeutic exercises, 97530- Therapeutic activity, [id number]- Neuromuscular re-education, 97535- Self Care, [id number]- Manual therapy, [id number]- Gait training, [phone]- Aquatic Therapy, [phone]- Electrical stimulation (unattended), and 20560 (1-2 muscles), 20561 (3+ muscles)- Dry Needling  PLAN FOR NEXT SESSION: Check STGs; Increase single limb stance/weightbearing on RLE and progress towards SLS; progress HEP for balance, RLE strength   Greig Anon, PT 11/16/23 12:33 PM Phone: [phone] Fax: [phone]  [facility] at [facility] [address], [address] [place], [place] [address] Phone # [phone] Fax # [phone]

## 2023-11-20 NOTE — Therapy (Signed)
 OUTPATIENT PHYSICAL THERAPY NEURO TREATMENT NOTE   Patient Name: Francisco Cowan MRN: [id number] DOB:[birth year], 73 y.o., male Today's Date: 11/21/2023   PCP: Francisco Lukes, DO REFERRING PROVIDER: Onita Duos, MD  END OF SESSION:  PT End of Session - 11/21/23 1145     Visit Number 6    Number of Visits 13    Date for Recertification  12/04/23    Authorization Type [employer]    Progress Note Due on Visit 10    PT Start Time 1104    PT Stop Time 1143    PT Time Calculation (min) 39 min    Equipment Utilized During Treatment Gait belt    Activity Tolerance Patient tolerated treatment well    Behavior During Therapy [facility] for tasks assessed/performed              Past Medical History:  Diagnosis Date   Abnormal ECG 09/19/2017   NSR. Left axis deviation.    Alcohol abuse    Anxiety    Depression    ED (erectile dysfunction)    GERD (gastroesophageal reflux disease)    History of kidney stones    Hypertension    Moderate tetrahydrocannabinol (THC) dependence (HCC)    Photodermatitis    Spinal stenosis    Urge incontinence    Past Surgical History:  Procedure Laterality Date   CARPAL TUNNEL RELEASE Bilateral 1991   COLONOSCOPY     CYST REMOVAL TRUNK N/A 07/2014   back   ESOPHAGOGASTRODUODENOSCOPY     ROTATOR CUFF REPAIR Left 2003/2018   ROTATOR CUFF REPAIR Right 02/10/2014   Study: Echo  06/27/2016   LV size normal.  LV wall thickness normal.  LVEF 55-60%.  Diastolic filling pattern indicates impaired relaxation.  GLS test 17%.  LA is mildly dilated.  Moderate AV sclerosis without stenosis.  Trace MR.  RVSP 22.25 mmHg p   TRANSFORAMINAL LUMBAR INTERBODY FUSION (TLIF) WITH PEDICLE SCREW FIXATION 1 LEVEL N/A 11/22/2017   Procedure: TRANSFORAMINAL LUMBAR INTERBODY FUSION Lumbar four-five;  Surgeon: Francisco Aures, MD;  Location: [facility] OR;  Service: Orthopedics;  Laterality: N/A;  4.5 hrs   ULNAR NERVE TRANSPOSITION Left 2018   Patient Active Problem List    Diagnosis Date Noted   OSA (obstructive sleep apnea) 08/30/2023   Bilateral carpal tunnel syndrome 08/10/2023   Fall 06/07/2023   Weakness 06/07/2023   Neuropathy 06/07/2023   Encounter to establish care with new doctor 01/19/2023   Obesity (BMI 30-39.9) 10/08/2018   Acute gout of left ankle 10/08/2018   Ingrown nail of second toe of left foot 10/08/2018   S/P lumbar fusion 11/22/2017   Allergy 10/12/2017   GERD (gastroesophageal reflux disease) 10/12/2017   Spinal stenosis 10/12/2017   Depression, major, single episode, mild 10/12/2017   GAD (generalized anxiety disorder) 10/12/2017   Elevated glucose 10/11/2017   Essential hypertension 10/11/2017   Degeneration of lumbar intervertebral disc 08/01/2017   Degenerative lumbar spinal stenosis 08/01/2017   Degenerative spondylolisthesis 08/01/2017    ONSET DATE: 2 years  REFERRING DIAG: G62.9 (ICD-10-CM) - Neuropathy W19.XXXD (ICD-10-CM) - Fall, subsequent encounter R53.1 (ICD-10-CM) - Weakness  THERAPY DIAG:  Unsteadiness on feet  Other abnormalities of gait and mobility  Difficulty in walking, not elsewhere classified  Rationale for Evaluation and Treatment: Rehabilitation  SUBJECTIVE:  SUBJECTIVE STATEMENT: No changes and no recent falls. Reports that he is walking with more confidence. Denies dizziness as this usually happens when he first stands up.   Pt accompanied by: self  PERTINENT HISTORY: Francisco Cowan is a 73 y.o. male   2 years history of lightheadedness when standing up, unsteady gait, falling episode Neck pain, radiating pain along his spine, left upper extremity  PAIN:  Are you having pain? No and Yes: NPRS scale: mild/10 Pain location: neck and LB Pain description: click Aggravating factors: unsure Relieving  factors: unsure  PRECAUTIONS: Fall  RED FLAGS: Notes some instances of urinary incontinence-urgency--denies any saddle anesthesia    WEIGHT BEARING RESTRICTIONS: No  FALLS: Has patient fallen in last 6 months? Yes. Number of falls at least 6-7 times--notes falling to the right more commonly and feels RLE weakness is more pronounced.   LIVING ENVIRONMENT: Lives with: lives with their family Lives in: House/apartment Stairs: ramp entrance, 1 level set-up Has following equipment at home: hurrycane, walking stick, RW, tub/shower w/ grab bars  PLOF: Independent, reports using AE for buttons  PATIENT GOALS: not fall down  OBJECTIVE:    TODAY'S TREATMENT: 11/21/23   M-CTSIB  Condition 1: Firm Surface, EO 30 Sec, Mild Sway  Condition 2: Firm Surface, EC 30 Sec, Mild and Moderate Sway  Condition 3: Foam Surface, EO 30 Sec, Moderate Sway  Condition 4: Foam Surface, EC 10 Sec, Moderate and posterior Sway     Activity Comments  Berg 51/56  Review of HEP: - Camera operator Together With Eyes Open  30 - Corner Balance Feet Together With Eyes Closed  30 - Sit to Stand   - Staggered Stance Forward Backward Weight Shift with Counter Support  - Standing Hip Flexion AROM (Mirrored) - Alternating Heel Raises  To ensure carryover and understanding of HEP. Provided cueing for safety and proper form   Standing on foam EC on foam Tendency to demo posterior wt shift/LOB with good improvement in stability when directing pressure anteriorly onto toes               [facility] PT Assessment - 11/21/23 0001       Berg Balance Test   Sit to Stand Able to stand without using hands and stabilize independently    Standing Unsupported Able to stand safely 2 minutes    Sitting with Back Unsupported but Feet Supported on Floor or Stool Able to sit safely and securely 2 minutes    Stand to Sit Sits safely with minimal use of hands    Transfers Able to transfer safely, minor use of hands     Standing Unsupported with Eyes Closed Able to stand 10 seconds safely    Standing Unsupported with Feet Together Able to place feet together independently and stand 1 minute safely    From Standing, Reach Forward with Outstretched Arm Can reach confidently >25 cm (10)    From Standing Position, Pick up Object from Floor Able to pick up shoe safely and easily    From Standing Position, Turn to Look Behind Over each Shoulder Looks behind one side only/other side shows less weight shift    Turn 360 Degrees Able to turn 360 degrees safely in 4 seconds or less    Standing Unsupported, Alternately Place Feet on Step/Stool Able to stand independently and complete 8 steps >20 seconds    Standing Unsupported, One Foot in Front Able to plae foot ahead of the other independently and hold 30  seconds    Standing on One Leg Able to lift leg independently and hold equal to or more than 3 seconds    Total Score 51           PATIENT EDUCATION: Education details: edu on progress towards goals and remianing impairments, re-administered HEP  Person educated: Patient Education method: Explanation, Demonstration, Tactile cues, Verbal cues, and Handouts Education comprehension: verbalized understanding and returned demonstration   HOME EXERCISE PROGRAM: Access Code: [email or web address] URL: [email or web address] Date: 11/16/2023 Prepared by: [facility] - Outpatient  Rehab - [facility]  Exercises - Corner Balance Feet Together With Eyes Open  - 1 x daily - 7 x weekly - 3 sets - 5-10 reps - Corner Balance Feet Together With Eyes Closed  - 1 x daily - 7 x weekly - 3 sets - 5-10 reps - Sit to Stand  - 1 x daily - 7 x weekly - 3 sets - 5 reps - Staggered Stance Forward Backward Weight Shift with Counter Support  - 1 x daily - 7 x weekly - 3 sets - 10 reps - Standing Hip Flexion AROM (Mirrored)  - 1 x daily - 7 x weekly - 2 sets - 10 reps - Alternating Heel Raises  - 1 x daily - 7 x weekly - 2 sets -  10 reps    ----------------------------------------------------- Note: Objective measures were completed at Evaluation unless otherwise noted.  DIAGNOSTIC FINDINGS:   Per Dr. 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, only mild chronic lumbosacral radiculopathy, fairly symmetric left and right,             Referred to physical therapy             Get MRI cervical from [facility]  Blood pressure lying down 136/78, heart rate of 60 Sitting up 139/84 heart rate of 64 Standing up 127/74 heart rate of 62 Standing up for 1 minute 124/77 heart rate of 60  COGNITION: Overall cognitive status: Within functional limits for tasks assessed   SENSATION: Notes a stocking/glove distrubution of numbness/tingling of extremities   COORDINATION: Difficulty with rapid alternating movements, RUE> LUE  MUSCLE TONE: possibly 2 beat clonus right ankle  POSTURE: No Significant postural limitations  LOWER EXTREMITY ROM:     WNL  LOWER EXTREMITY MMT:    Strength is grossly 5/5 to seated resisted tests/myotomes BLE BED MOBILITY:  Independent  TRANSFERS: Sit to stand: Complete Independence  Assistive device utilized: None     Stand to sit: Complete Independence  Assistive device utilized: None     Chair to chair: Complete Independence  Assistive device utilized: None       RAMP:    CURB:  Not tested  STAIRS: Not tested GAIT: Findings: Gait Characteristics: wide BOS and Comments: unsteady with turns  FUNCTIONAL TESTS:  Eval: 5 times sit to stand: TBD 10 meter walk test:  TBD Berg Balance Test: 46/56 Dynamic Gait Index: TBD  M-CTSIB  Condition 1: Firm Surface, EO 30 Sec, Mild Sway  Condition 2: Firm Surface, EC 30 Sec, Moderate Sway  Condition 3: Foam Surface, EO 30 Sec, Mild Sway  Condition 4: Foam Surface, EC 18 Sec, Moderate and Severe Sway   11/02/23: 41M walk:  11.31 sec = 2.9 ft/sec   DGI:  17/24 Uses walking pole; scores <19/24 indicate increased fall risk  ABC scale = 51.25 %   5x sit to stand:  16.18 sec  From 18 mat height    PATIENT SURVEYS:  ABC scale: TBD                                                                                                                         GOALS: Goals reviewed with patient? Yes  SHORT TERM GOALS: Target date: 11/13/2023    Patient will be independent in HEP to improve functional outcomes Baseline: reviewed today and required cueing d/t poor recall 11/21/23 Goal status: NOT MET 11/21/23  2.  Demo improved postural control per mild sway x 30 sec condition 4 M-CTSIB for improved safety during ADL Baseline: 18 sec mod-severe; 10 sec with moderate sway 11/21/23 Goal status: NOT MET 11/21/23  3.  Demo improved standing balance per score 50/56 Berg Balance Test for improved safety during ADL Baseline: 46/56; 51/56 11/21/23 Goal status: MET 11/21/23    LONG TERM GOALS: Target date: 12/04/2023    Dynamic Gait Index score to improve to 20/24 for decreased fall risk. Baseline: 17/24 Goal status: INITIAL  2.  ABC scale to improve to at least 60% for improved balance confidence. Baseline: 51.25% Goal status: INITIAL  3.  Modified independent ambulation uneven surfaces to improve safety in community Baseline: reports avoiding Goal status: INITIAL  4.  Gait speed, 10 meter walk Baseline: 2.9 ft/sec Goal status: DEFERRED, 11/02/2023    ASSESSMENT:  CLINICAL IMPRESSION: Patient arrived to session without new complaints. He notes that he is "walking with more confidence." Multisensory balance  testing still appears limited and this STG has not been met. However, patient met Lars goal, today scoring 51/56. Poor recall of HEP, thus this was reviewed in detail and re-administered. Remainder of session focused on compliance surface balance training with good improvement after practice. Patient is progressing towards goals.   OBJECTIVE IMPAIRMENTS: Abnormal gait, decreased activity tolerance, decreased balance, decreased coordination, decreased endurance, decreased knowledge of use of DME, difficulty walking, impaired sensation, and impaired UE functional use.   ACTIVITY LIMITATIONS: carrying, lifting, standing, stairs, transfers, reach over head, and locomotion level  PARTICIPATION LIMITATIONS: meal prep, cleaning, laundry, interpersonal relationship, shopping, community activity, and yard work  PERSONAL FACTORS: Age, Time since onset of injury/illness/exacerbation, and 1-2 comorbidities: PMH, hx of lumbar sx are also affecting patient's functional outcome.   REHAB POTENTIAL: Good  CLINICAL DECISION MAKING: Evolving/moderate complexity  EVALUATION COMPLEXITY: Moderate  PLAN:  PT FREQUENCY: 2x/week  PT DURATION: 6 weeks  PLANNED INTERVENTIONS: 97750- Physical Performance Testing, 97110-Therapeutic exercises, 97530- Therapeutic activity, [id number]- Neuromuscular  re-education, [phone]- Self Care, 02859- Manual therapy, [phone]- Gait training, [phone]- Aquatic Therapy, [phone]- Electrical stimulation (unattended), and 20560 (1-2 muscles), 20561 (3+ muscles)- Dry Needling  PLAN FOR NEXT SESSION: Increase single limb stance/weightbearing on RLE and progress towards SLS; progress HEP for balance, RLE strength     Louana Terrilyn Christians, PT, DPT 11/21/23 11:48 AM  [facility] [address], [address] [place], [place] [address] Phone # [phone] Fax # [phone]

## 2023-11-21 ENCOUNTER — Ambulatory Visit: Admitting: Physical Therapy

## 2023-11-21 DIAGNOSIS — R2689 Other abnormalities of gait and mobility: Secondary | ICD-10-CM

## 2023-11-21 DIAGNOSIS — R2681 Unsteadiness on feet: Secondary | ICD-10-CM | POA: Diagnosis not present

## 2023-11-21 DIAGNOSIS — R262 Difficulty in walking, not elsewhere classified: Secondary | ICD-10-CM

## 2023-11-22 ENCOUNTER — Other Ambulatory Visit: Payer: Self-pay | Admitting: Family Medicine

## 2023-11-23 NOTE — Therapy (Signed)
 OUTPATIENT PHYSICAL THERAPY NEURO TREATMENT NOTE   Patient Name: Francisco Cowan MRN: [id number] DOB:1950/08/27, 73 y.o., male Today's Date: 11/24/2023   PCP: Cleotilde Lukes, DO REFERRING PROVIDER: Onita Duos, MD  END OF SESSION:  PT End of Session - 11/24/23 1145     Visit Number 7    Number of Visits 13    Date for Recertification  12/04/23    Authorization Type [employer]    Progress Note Due on Visit 10    PT Start Time 1103    PT Stop Time 1145    PT Time Calculation (min) 42 min    Equipment Utilized During Treatment Gait belt    Activity Tolerance Patient tolerated treatment well    Behavior During Therapy WFL for tasks assessed/performed               Past Medical History:  Diagnosis Date   Abnormal ECG 09/19/2017   NSR. Left axis deviation.    Alcohol abuse    Anxiety    Depression    ED (erectile dysfunction)    GERD (gastroesophageal reflux disease)    History of kidney stones    Hypertension    Moderate tetrahydrocannabinol (THC) dependence (HCC)    Photodermatitis    Spinal stenosis    Urge incontinence    Past Surgical History:  Procedure Laterality Date   CARPAL TUNNEL RELEASE Bilateral 1991   COLONOSCOPY     CYST REMOVAL TRUNK N/A 07/2014   back   ESOPHAGOGASTRODUODENOSCOPY     ROTATOR CUFF REPAIR Left 2003/2018   ROTATOR CUFF REPAIR Right 02/10/2014   Study: Echo  06/27/2016   LV size normal.  LV wall thickness normal.  LVEF 55-60%.  Diastolic filling pattern indicates impaired relaxation.  GLS test 17%.  LA is mildly dilated.  Moderate AV sclerosis without stenosis.  Trace MR.  RVSP 22.25 mmHg p   TRANSFORAMINAL LUMBAR INTERBODY FUSION (TLIF) WITH PEDICLE SCREW FIXATION 1 LEVEL N/A 11/22/2017   Procedure: TRANSFORAMINAL LUMBAR INTERBODY FUSION Lumbar four-five;  Surgeon: Burnetta Aures, MD;  Location: [facility] OR;  Service: Orthopedics;  Laterality: N/A;  4.5 hrs   ULNAR NERVE TRANSPOSITION Left 2018   Patient Active Problem List    Diagnosis Date Noted   OSA (obstructive sleep apnea) 08/30/2023   Bilateral carpal tunnel syndrome 08/10/2023   Fall 06/07/2023   Weakness 06/07/2023   Neuropathy 06/07/2023   Encounter to establish care with new doctor 01/19/2023   Obesity (BMI 30-39.9) 10/08/2018   Acute gout of left ankle 10/08/2018   Ingrown nail of second toe of left foot 10/08/2018   S/P lumbar fusion 11/22/2017   Allergy 10/12/2017   GERD (gastroesophageal reflux disease) 10/12/2017   Spinal stenosis 10/12/2017   Depression, major, single episode, mild 10/12/2017   GAD (generalized anxiety disorder) 10/12/2017   Elevated glucose 10/11/2017   Essential hypertension 10/11/2017   Degeneration of lumbar intervertebral disc 08/01/2017   Degenerative lumbar spinal stenosis 08/01/2017   Degenerative spondylolisthesis 08/01/2017    ONSET DATE: 2 years  REFERRING DIAG: G62.9 (ICD-10-CM) - Neuropathy W19.XXXD (ICD-10-CM) - Fall, subsequent encounter R53.1 (ICD-10-CM) - Weakness  THERAPY DIAG:  Unsteadiness on feet  Other abnormalities of gait and mobility  Difficulty in walking, not elsewhere classified  Rationale for Evaluation and Treatment: Rehabilitation  SUBJECTIVE:  SUBJECTIVE STATEMENT: Doing okay. Reports feeling dizzy and wobbly this morning. Woke up that way.   Pt accompanied by: self  PERTINENT HISTORY: Francisco Cowan is a 73 y.o. male   2 years history of lightheadedness when standing up, unsteady gait, falling episode Neck pain, radiating pain along his spine, left upper extremity  PAIN:  Are you having pain? No and Yes: NPRS scale: 5-6/10 Pain location: neck Pain description: click Aggravating factors: unsure Relieving factors: unsure  PRECAUTIONS: Fall  RED FLAGS: Notes some instances of  urinary incontinence-urgency--denies any saddle anesthesia    WEIGHT BEARING RESTRICTIONS: No  FALLS: Has patient fallen in last 6 months? Yes. Number of falls at least 6-7 times--notes falling to the right more commonly and feels RLE weakness is more pronounced.   LIVING ENVIRONMENT: Lives with: lives with their family Lives in: House/apartment Stairs: ramp entrance, 1 level set-up Has following equipment at home: hurrycane, walking stick, RW, tub/shower w/ grab bars  PLOF: Independent, reports using AE for buttons  PATIENT GOALS: not fall down  OBJECTIVE:       TODAY'S TREATMENT: 11/24/23 Activity Comments  standing on foam EC on foam Ant/pos wt shifting on foam Mild-mod sway. Difficulty shifting anteriorly onto toes  marching on foam B UE support; cues for larger amplitude and wt shift anteriorly   Standing heel raises    Single leg heel raises  Discontinued on R LE d/t c/o R thigh pain   R mod thomas stretch with foot on 4 stool To address R lateral thigh pain  R TFL stretch with strap Reports improved LB tightness   alt toe tap to cone Limited stability with R LE stabilizing     PATIENT EDUCATION: Education details: HEP update Person educated: Patient Education method: Explanation, Demonstration, Tactile cues, Verbal cues, and Handouts Education comprehension: verbalized understanding and returned demonstration   HOME EXERCISE PROGRAM: Access Code: [email or web address] URL: [email or web address] Date: 11/24/2023 Prepared by: [facility] - Outpatient  Rehab - [facility]  Exercises - Corner Balance Feet Together With Eyes Open  - 1 x daily - 7 x weekly - 3 sets - 5-10 reps - Corner Balance Feet Together With Eyes Closed  - 1 x daily - 7 x weekly - 3 sets - 5-10 reps - Sit to Stand  - 1 x daily - 7 x weekly - 3 sets - 5 reps - Staggered Stance Forward Backward Weight Shift with Counter Support  - 1 x daily - 7 x weekly - 3 sets - 10 reps - Standing Hip  Flexion AROM (Mirrored)  - 1 x daily - 7 x weekly - 2 sets - 10 reps - Alternating Heel Raises  - 1 x daily - 7 x weekly - 2 sets - 10 reps - Supine ITB Stretch with Strap  - 1 x daily - 5 x weekly - 2 sets - 30 sec hold - Modified Thomas Stretch  - 1 x daily - 5 x weekly - 2 sets - 30 sec hold    ----------------------------------------------------- Note: Objective measures were completed at Evaluation unless otherwise noted.  DIAGNOSTIC FINDINGS:   Per Dr. 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, only mild chronic lumbosacral radiculopathy, fairly symmetric left and right,             Referred to physical therapy             Get MRI cervical from [facility]  Blood pressure lying down 136/78, heart rate of 60 Sitting up 139/84 heart rate of 64 Standing up 127/74 heart rate of 62 Standing up for 1 minute 124/77 heart rate of 60  COGNITION: Overall cognitive status: Within functional limits for tasks assessed   SENSATION: Notes a stocking/glove distrubution of numbness/tingling of extremities   COORDINATION: Difficulty with rapid alternating movements, RUE> LUE  MUSCLE TONE: possibly 2 beat clonus right ankle  POSTURE: No Significant postural limitations  LOWER EXTREMITY ROM:     WNL  LOWER EXTREMITY MMT:    Strength is grossly 5/5 to seated resisted tests/myotomes BLE BED MOBILITY:  Independent  TRANSFERS: Sit to stand: Complete Independence  Assistive device utilized: None     Stand to sit: Complete Independence  Assistive device utilized: None     Chair  to chair: Complete Independence  Assistive device utilized: None       RAMP:    CURB:  Not tested  STAIRS: Not tested GAIT: Findings: Gait Characteristics: wide BOS and Comments: unsteady with turns  FUNCTIONAL TESTS:  Eval: 5 times sit to stand: TBD 10 meter walk test: TBD Berg Balance Test: 46/56 Dynamic Gait Index: TBD  M-CTSIB  Condition 1: Firm Surface, EO 30 Sec, Mild Sway  Condition 2: Firm Surface, EC 30 Sec, Moderate Sway  Condition 3: Foam Surface, EO 30 Sec, Mild Sway  Condition 4: Foam Surface, EC 18 Sec, Moderate and Severe Sway   11/02/23: 42M walk:  11.31 sec = 2.9 ft/sec   DGI:  17/24 Uses walking pole; scores <19/24 indicate increased fall risk  ABC scale = 51.25 %   5x sit to stand:  16.18 sec  From 18 mat height    PATIENT SURVEYS:  ABC scale: TBD                                                                                                                         GOALS: Goals reviewed with patient? Yes  SHORT TERM GOALS: Target date: 11/13/2023    Patient will be independent in HEP to improve functional outcomes Baseline: reviewed today and required cueing d/t poor recall 11/21/23 Goal status: NOT MET 11/21/23  2.  Demo improved postural control per mild sway x 30 sec condition 4 M-CTSIB for improved safety during ADL Baseline: 18 sec mod-severe; 10 sec with moderate sway 11/21/23 Goal status: NOT MET 11/21/23  3.  Demo improved standing balance per score 50/56 Berg Balance Test for improved safety during ADL Baseline: 46/56;  51/56 11/21/23 Goal status: MET 11/21/23    LONG TERM GOALS: Target date: 12/04/2023    Dynamic Gait Index score to improve to 20/24 for decreased fall risk. Baseline: 17/24 Goal status: INITIAL  2.  ABC scale to improve to at least 60% for improved balance confidence. Baseline: 51.25% Goal status: INITIAL  3.  Modified independent ambulation uneven surfaces to improve safety in community Baseline: reports  avoiding Goal status: INITIAL  4.  Gait speed, 10 meter walk Baseline: 2.9 ft/sec Goal status: DEFERRED, 11/02/2023    ASSESSMENT:  CLINICAL IMPRESSION: Patient arrived to session with report of increased dizziness and imbalance today. Worked on compliant surface training with focus on increasing comfort with anterior wt shift to prevent forward LOB. Suspect calf weakness as partial contributing factor to this. Patient noted R thigh pain which was addressed with gentle stretching. Pt noted good benefit, thus this was updated into HEP. No complaints at end of session.   OBJECTIVE IMPAIRMENTS: Abnormal gait, decreased activity tolerance, decreased balance, decreased coordination, decreased endurance, decreased knowledge of use of DME, difficulty walking, impaired sensation, and impaired UE functional use.   ACTIVITY LIMITATIONS: carrying, lifting, standing, stairs, transfers, reach over head, and locomotion level  PARTICIPATION LIMITATIONS: meal prep, cleaning, laundry, interpersonal relationship, shopping, community activity, and yard work  PERSONAL FACTORS: Age, Time since onset of injury/illness/exacerbation, and 1-2 comorbidities: PMH, hx of lumbar sx are also affecting patient's functional outcome.   REHAB POTENTIAL: Good  CLINICAL DECISION MAKING: Evolving/moderate complexity  EVALUATION COMPLEXITY: Moderate  PLAN:  PT FREQUENCY: 2x/week  PT DURATION: 6 weeks  PLANNED INTERVENTIONS: 97750- Physical Performance Testing, 97110-Therapeutic exercises, 97530- Therapeutic activity, 97112- Neuromuscular re-education, 97535- Self Care, 02859- Manual therapy, [phone]- Gait training, [phone]- Aquatic Therapy, [phone]- Electrical stimulation (unattended), and 20560 (1-2 muscles), 20561 (3+ muscles)- Dry Needling  PLAN FOR NEXT SESSION: Increase single limb stance/weightbearing on RLE and progress towards SLS; progress HEP for balance, RLE strength     Louana Terrilyn Christians, PT,  DPT 11/24/23 11:47 AM  [facility] at [facility] [address] [place], [place] [address] Phone # [phone] Fax # [phone]

## 2023-11-24 ENCOUNTER — Ambulatory Visit: Admitting: Physical Therapy

## 2023-11-24 ENCOUNTER — Encounter: Payer: Self-pay | Admitting: Physical Therapy

## 2023-11-24 DIAGNOSIS — R2689 Other abnormalities of gait and mobility: Secondary | ICD-10-CM | POA: Diagnosis not present

## 2023-11-24 DIAGNOSIS — R2681 Unsteadiness on feet: Secondary | ICD-10-CM

## 2023-11-24 DIAGNOSIS — R262 Difficulty in walking, not elsewhere classified: Secondary | ICD-10-CM | POA: Diagnosis not present

## 2023-11-28 ENCOUNTER — Ambulatory Visit: Admitting: Physical Therapy

## 2023-11-28 ENCOUNTER — Encounter: Payer: Self-pay | Admitting: Physical Therapy

## 2023-11-28 DIAGNOSIS — R2681 Unsteadiness on feet: Secondary | ICD-10-CM | POA: Diagnosis not present

## 2023-11-28 DIAGNOSIS — R2689 Other abnormalities of gait and mobility: Secondary | ICD-10-CM

## 2023-11-28 DIAGNOSIS — R262 Difficulty in walking, not elsewhere classified: Secondary | ICD-10-CM | POA: Diagnosis not present

## 2023-11-28 NOTE — Therapy (Signed)
 OUTPATIENT PHYSICAL THERAPY NEURO TREATMENT NOTE   Patient Name: Francisco Cowan MRN: [id number] DOB:04-09-1950, 73 y.o., male Today's Date: 11/28/2023   PCP: Cleotilde Lukes, DO REFERRING PROVIDER: Onita Duos, MD  END OF SESSION:  PT End of Session - 11/28/23 1104     Visit Number 8    Number of Visits 13    Date for Recertification  12/04/23    Authorization Type [employer] Medicare    Progress Note Due on Visit 10    PT Start Time 1104    PT Stop Time 1144    PT Time Calculation (min) 40 min    Equipment Utilized During Treatment Gait belt    Activity Tolerance Patient tolerated treatment well    Behavior During Therapy [facility] for tasks assessed/performed                Past Medical History:  Diagnosis Date   Abnormal ECG 09/19/2017   NSR. Left axis deviation.    Alcohol abuse    Anxiety    Depression    ED (erectile dysfunction)    GERD (gastroesophageal reflux disease)    History of kidney stones    Hypertension    Moderate tetrahydrocannabinol (THC) dependence (HCC)    Photodermatitis    Spinal stenosis    Urge incontinence    Past Surgical History:  Procedure Laterality Date   CARPAL TUNNEL RELEASE Bilateral 1991   COLONOSCOPY     CYST REMOVAL TRUNK N/A 07/2014   back   ESOPHAGOGASTRODUODENOSCOPY     ROTATOR CUFF REPAIR Left 2003/2018   ROTATOR CUFF REPAIR Right 02/10/2014   Study: Echo  06/27/2016   LV size normal.  LV wall thickness normal.  LVEF 55-60%.  Diastolic filling pattern indicates impaired relaxation.  GLS test 17%.  LA is mildly dilated.  Moderate AV sclerosis without stenosis.  Trace MR.  RVSP 22.25 mmHg p   TRANSFORAMINAL LUMBAR INTERBODY FUSION (TLIF) WITH PEDICLE SCREW FIXATION 1 LEVEL N/A 11/22/2017   Procedure: TRANSFORAMINAL LUMBAR INTERBODY FUSION Lumbar four-five;  Surgeon: Burnetta Aures, MD;  Location: [facility] OR;  Service: Orthopedics;  Laterality: N/A;  4.5 hrs   ULNAR NERVE TRANSPOSITION Left 2018   Patient Active Problem List    Diagnosis Date Noted   OSA (obstructive sleep apnea) 08/30/2023   Bilateral carpal tunnel syndrome 08/10/2023   Fall 06/07/2023   Weakness 06/07/2023   Neuropathy 06/07/2023   Encounter to establish care with new doctor 01/19/2023   Obesity (BMI 30-39.9) 10/08/2018   Acute gout of left ankle 10/08/2018   Ingrown nail of second toe of left foot 10/08/2018   S/P lumbar fusion 11/22/2017   Allergy 10/12/2017   GERD (gastroesophageal reflux disease) 10/12/2017   Spinal stenosis 10/12/2017   Depression, major, single episode, mild 10/12/2017   GAD (generalized anxiety disorder) 10/12/2017   Elevated glucose 10/11/2017   Essential hypertension 10/11/2017   Degeneration of lumbar intervertebral disc 08/01/2017   Degenerative lumbar spinal stenosis 08/01/2017   Degenerative spondylolisthesis 08/01/2017    ONSET DATE: 2 years  REFERRING DIAG: G62.9 (ICD-10-CM) - Neuropathy W19.XXXD (ICD-10-CM) - Fall, subsequent encounter R53.1 (ICD-10-CM) - Weakness  THERAPY DIAG:  Unsteadiness on feet  Other abnormalities of gait and mobility  Rationale for Evaluation and Treatment: Rehabilitation  SUBJECTIVE:  SUBJECTIVE STATEMENT: No dizziness today, it comes and goes.  Went to [facility] and got fitted for the brace and should get it in about 4 weeks.  I just wonder if I'm going to have to have surgery.  Think I've improved more balance with walking and don't feel like I'm going to fall anymore.    Pt accompanied by: self  PERTINENT HISTORY: Francisco Cowan is a 73 y.o. male   2 years history of lightheadedness when standing up, unsteady gait, falling episode Neck pain, radiating pain along his spine, left upper extremity  PAIN:  Are you having pain? No and Yes: NPRS scale: 5-6/10 Pain location:  neck Pain description: click Aggravating factors: unsure Relieving factors: unsure  PRECAUTIONS: Fall  RED FLAGS: Notes some instances of urinary incontinence-urgency--denies any saddle anesthesia    WEIGHT BEARING RESTRICTIONS: No  FALLS: Has patient fallen in last 6 months? Yes. Number of falls at least 6-7 times--notes falling to the right more commonly and feels RLE weakness is more pronounced.   LIVING ENVIRONMENT: Lives with: lives with their family Lives in: House/apartment Stairs: ramp entrance, 1 level set-up Has following equipment at home: hurrycane, walking stick, RW, tub/shower w/ grab bars  PLOF: Independent, reports using AE for buttons  PATIENT GOALS: not fall down  OBJECTIVE:    *Pt reports not wearing his brace today   TODAY'S TREATMENT: 11/28/23 Activity Comments  standing on foam EC on foam Ant/pos wt shifting on foam Mild-mod sway. Difficulty shifting anteriorly onto toes  marching on foam B UE support; cues for larger amplitude and wt shift anteriorly   Forward step and weightshift over obstacles Side step over obstacles Decreased R foot clearance at times, uses vision to assist with position/clearance  Corner balance exercises:  reviewed feet together EC head turns/nods Mod sway  Corner balance feet stagger stance with EO head turns/nods EC x 15-30 sec Mild-mod sway     alt toe tap to 8 step, on Airex Less stability with R LE stabilizing   Outdoor gait with walking pole -head turns/nods on level ground -negotiating sidewalk and patio surfaces Mod I, no LOB, he does tend to look down    HOME EXERCISE PROGRAM: Access Code: [email or web address] URL: [email or web address] Date: 11/28/2023 Prepared by: [facility] - Outpatient  Rehab - [facility]  Exercises - Corner Balance Feet Together With Eyes Closed  - 1 x daily - 7 x weekly - 3 sets - 5-10 reps - Sit to Stand  - 1 x daily - 7 x weekly - 3 sets - 5 reps - Staggered Stance Forward  Backward Weight Shift with Counter Support  - 1 x daily - 7 x weekly - 3 sets - 10 reps - Standing Hip Flexion AROM (Mirrored)  - 1 x daily - 7 x weekly - 2 sets - 10 reps - Alternating Heel Raises  - 1 x daily - 7 x weekly - 2 sets - 10 reps - Supine ITB Stretch with Strap  - 1 x daily - 5 x weekly - 2 sets - 30 sec hold - Modified Thomas Stretch  - 1 x daily - 5 x weekly - 2 sets - 30 sec hold - Semi-Tandem Corner Balance With Eyes Closed  - 1 x daily - 5 x weekly - 1 sets - 3 reps - 15-30 sec hold - Semi-Tandem Corner Balance: Eyes Open With Head Turns  - 1 x daily - 5 x weekly - 1 sets - 5 reps  PATIENT EDUCATION: Education details: HEP update, plans for likely discharge next visit Person educated: Patient Education method: Explanation, Demonstration, Tactile cues, Verbal cues, and Handouts Education comprehension: verbalized understanding and returned demonstration      ----------------------------------------------------- Note: Objective measures were completed at Evaluation unless otherwise noted.  DIAGNOSTIC FINDINGS:   Per Dr. 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, only mild chronic lumbosacral radiculopathy, fairly symmetric left and right,             Referred to physical therapy             Get MRI cervical from [facility]  Blood pressure lying down 136/78, heart rate of 60 Sitting up 139/84 heart rate of 64 Standing up 127/74 heart rate of 62 Standing up for 1 minute 124/77 heart rate of 60  COGNITION: Overall cognitive status: Within functional limits  for tasks assessed   SENSATION: Notes a stocking/glove distrubution of numbness/tingling of extremities   COORDINATION: Difficulty with rapid alternating movements, RUE> LUE  MUSCLE TONE: possibly 2 beat clonus right ankle  POSTURE: No Significant postural limitations  LOWER EXTREMITY ROM:     WNL  LOWER EXTREMITY MMT:    Strength is grossly 5/5 to seated resisted tests/myotomes BLE BED MOBILITY:  Independent  TRANSFERS: Sit to stand: Complete Independence  Assistive device utilized: None     Stand to sit: Complete Independence  Assistive device utilized: None     Chair to chair: Complete Independence  Assistive device utilized: None       RAMP:    CURB:  Not tested  STAIRS: Not tested GAIT: Findings: Gait Characteristics: wide BOS and Comments: unsteady with turns  FUNCTIONAL TESTS:  Eval: 5 times sit to stand: TBD 10 meter walk test: TBD Berg Balance Test: 46/56 Dynamic Gait Index: TBD  M-CTSIB  Condition 1: Firm Surface, EO 30 Sec, Mild Sway  Condition 2: Firm Surface, EC 30 Sec, Moderate Sway  Condition 3: Foam Surface, EO 30 Sec, Mild Sway  Condition 4: Foam Surface, EC 18 Sec, Moderate and Severe Sway   11/02/23: 89M walk:  11.31 sec = 2.9 ft/sec   DGI:  17/24 Uses walking pole; scores <19/24 indicate increased fall risk  ABC scale = 51.25 %   5x sit to stand:  16.18 sec  From 18 mat height    PATIENT SURVEYS:  ABC scale: TBD  GOALS: Goals reviewed with patient? Yes  SHORT TERM GOALS: Target date: 11/13/2023    Patient will be independent in HEP to improve functional outcomes Baseline: reviewed today and required cueing d/t poor recall 11/21/23 Goal status: NOT MET 11/21/23  2.  Demo improved postural control per mild sway x 30 sec condition 4 M-CTSIB for improved safety during ADL Baseline: 18 sec mod-severe; 10 sec with  moderate sway 11/21/23 Goal status: NOT MET 11/21/23  3.  Demo improved standing balance per score 50/56 Berg Balance Test for improved safety during ADL Baseline: 46/56; 51/56 11/21/23 Goal status: MET 11/21/23    LONG TERM GOALS: Target date: 12/04/2023    Dynamic Gait Index score to improve to 20/24 for decreased fall risk. Baseline: 17/24 Goal status: INITIAL  2.  ABC scale to improve to at least 60% for improved balance confidence. Baseline: 51.25% Goal status: INITIAL  3.  Modified independent ambulation uneven surfaces to improve safety in community Baseline: doing more of this with walking pole, mod I Goal status: MET, 11/28/2023  4.  Gait speed, 10 meter walk Baseline: 2.9 ft/sec Goal status: DEFERRED, 11/02/2023    ASSESSMENT:  CLINICAL IMPRESSION: Pt presents today and reports he saw [facility]; they have ordered a brace for him and it should arrive in about 4 weeks. He isn't wearing his brace today, but overall with balance and gait activities, he is steadier overall than when he started with therapy.  Skilled PT session focused on review/progression of corner balance exercises as well as obstacle negotiation and outdoor gait.  He has met [facility] for outdoor gait.  He feels he is likely ready for discharge next visit, and he does have follow up appt with neurologist tomorrow.  No complaints upon leaving therapy session today. OBJECTIVE IMPAIRMENTS: Abnormal gait, decreased activity tolerance, decreased balance, decreased coordination, decreased endurance, decreased knowledge of use of DME, difficulty walking, impaired sensation, and impaired UE functional use.   ACTIVITY LIMITATIONS: carrying, lifting, standing, stairs, transfers, reach over head, and locomotion level  PARTICIPATION LIMITATIONS: meal prep, cleaning, laundry, interpersonal relationship, shopping, community activity, and yard work  PERSONAL FACTORS: Age, Time since onset of injury/illness/exacerbation, and 1-2  comorbidities: PMH, hx of lumbar sx are also affecting patient's functional outcome.   REHAB POTENTIAL: Good  CLINICAL DECISION MAKING: Evolving/moderate complexity  EVALUATION COMPLEXITY: Moderate  PLAN:  PT FREQUENCY: 2x/week  PT DURATION: 6 weeks  PLANNED INTERVENTIONS: 97750- Physical Performance Testing, 97110-Therapeutic exercises, 97530- Therapeutic activity, 97112- Neuromuscular re-education, 97535- Self Care, 02859- Manual therapy, [phone]- Gait training, [phone]- Aquatic Therapy, [phone]- Electrical stimulation (unattended), and 20560 (1-2 muscles), 20561 (3+ muscles)- Dry Needling  PLAN FOR NEXT SESSION: Check remaining LTGs and plan for discharge     Greig Anon, PT 11/28/23 11:55 AM Phone: [phone] Fax: [phone]  [facility] at [facility] [address], [address] [place], [place] [address] Phone # [phone] Fax # [phone]

## 2023-11-29 ENCOUNTER — Ambulatory Visit: Admitting: Neurology

## 2023-11-29 VITALS — BP 132/76 | HR 62 | Resp 14 | Ht 68.0 in

## 2023-11-29 DIAGNOSIS — G5603 Carpal tunnel syndrome, bilateral upper limbs: Secondary | ICD-10-CM | POA: Diagnosis not present

## 2023-11-29 DIAGNOSIS — G629 Polyneuropathy, unspecified: Secondary | ICD-10-CM

## 2023-11-29 DIAGNOSIS — R269 Unspecified abnormalities of gait and mobility: Secondary | ICD-10-CM | POA: Diagnosis not present

## 2023-11-29 DIAGNOSIS — M4802 Spinal stenosis, cervical region: Secondary | ICD-10-CM

## 2023-11-29 MED ORDER — CELECOXIB 50 MG PO CAPS: 50.0000 mg | ORAL_CAPSULE | Freq: Two times a day (BID) | ORAL | 3 refills | Status: AC | PRN

## 2023-11-29 MED ORDER — GABAPENTIN 300 MG PO CAPS
300.0000 mg | ORAL_CAPSULE | Freq: Two times a day (BID) | ORAL | 3 refills | Status: DC | PRN
Start: 2023-11-29 — End: 2023-12-19

## 2023-11-29 NOTE — Progress Notes (Unsigned)
 Chief Complaint  Patient presents with   Results    RM13, alone, Pt is here to go over results       ASSESSMENT AND PLAN  Francisco Cowan is a 73 y.o. 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 MRI of cervical spine from [facility] April 17, 2020, multilevel degenerative changes, mild canal stenosis C6-7, AP diameter of 9 mm, C6-7 moderate foraminal on the right, C5-6, the AP diameter of 10 mm, bilateral foraminal narrowing  MEDICAL HISTORY:  Francisco Cowan, is a 73 year old male referred by Dr. Margaret seen in request by his primary care doctor   Cleotilde Lukes, for evaluation of unsteady gait, neck pain radiating pain to left upper extremity  History is obtained from the patient and review of electronic medical records. I personally reviewed pertinent available imaging films in PACS.   PMHx of  HTN Depression, anxiety Drink 24 oz beer daily Hx of Kidney stone Hx of bilateral CTS release in 1991 Left Ulnar transpositional surgery in 2018 Lumbar decompression in 2021, for low back pain, radiating pain to right side,   Since 2023, he noticed dizziness sensation, fell  few times, he described lightheaded dizziness when he stands up quickly or change position quickly, fell frequently, often proceeding with similar sensation of lightheadedness, then lower extremity give up underneath him, denies similar sensation when sitting down or lying down position  He had a history of bilateral carpal tunnel release in 1991, now complains of mild bilateral feet cold numb sensation, finger numbness tingling, neck pain, if he turns his neck wrong, he describes shocking sensation up and down his spine, radiating paresthesia to his left upper extremity  He was seen by orthopedic surgeon Dr. Burnetta, had MRI of cervical spine, but I do not have access to the film or report.   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  In addition, he has moderately severe bilateral carpal tunnel syndrome, right worse than left; and mild bilateral ulnar neuropathy, crossed the elbow, did have a history of left ulnar transposition surgery in the past  He is also under the care of of Dr. Burnetta for neck pain, radiating pain to left upper extremity, pending appointment, I faxed the EMG nerve conduction study report over  UPDATE October 18 2023: MRI of lumbar spine from [facility] September 24, 2023,  Postsurgical change of lumbar spine fusion L4-5, multiple level disc degeneration, L2-3, moderate to severe  right and moderate left foraminal stenosis, with potential encroachment on the exiting bilateral L2 nerve roots, L3-4, severe right, moderate to severe left foraminal stenosis with encroachment on the exiting bilateral L3 nerve roots, L4-5 postsurgical level, advanced facet arthrosis, mild to moderate left mild right foraminal stenosis, L5-S1, no significant central canal stenosis mild to moderate left mild right foraminal stenosis  I did not see MRI of cervical report  He complains of slow worsening gait abnormality, falling episode, it usually happens shortly after he stands up, walking towards the kitchen, or answering the door, his leg just gave out underneath him, left leg seems to give him more trouble  He has no significant orthostatic blood pressure change on today's examination,  Talked with his orthopedic surgeon Dr. Burnetta on October 04, 2023, he is concerned about his worsening unsteady gait, requesting EMG study of left lower extremity to evaluate left lumbar radiculopathy  EMG nerve conduction study of left lower extremity October 18, 2023, showed no significant difference compared to findings at right lower extremity in April 2025, continued evidence of moderate length-dependent axonal peripheral neuropathy, mild left L4-5 radiculopathy, mixture of normal and mild neuropathic motor unit potential, with slight decreased recruitment patterns  On examination, he has more right distal weakness, moderate right toe flexion extension weakness, right hammertoes, only slight left to distal weakness, length-dependent sensory changes,  He does complains of worsening urinary urgency, brisk patellar reflex,  PHYSICAL EXAMNIATION: Blood pressure lying down 136/78, heart rate of 60 Sitting up 139/84 heart rate of 64 Standing up 127/74 heart rate of 62 Standing up for 1 minute 124/77 heart rate of 60  Gen: NAD, conversant, well nourised, well groomed                     Cardiovascular: Regular  rate rhythm, no peripheral edema, warm, nontender. Eyes: Conjunctivae clear without exudates or hemorrhage Neck: Supple, no carotid bruits. Pulmonary: Clear to auscultation bilaterally   NEUROLOGICAL EXAM:  MENTAL STATUS: Speech/cognition: Awake, alert oriented to history taking and casual conversation  CRANIAL NERVES: CN II: Visual fields are full to confrontation.  Pupils are round equal and briskly reactive to light. CN III, IV, VI: extraocular movement are normal. No ptosis. CN V: Facial sensation is intact to pinprick in all 3 divisions bilaterally. Corneal responses are intact.  CN VII: Face is symmetric with normal eye closure and smile. CN VIII: Hearing is normal to casual conversation CN IX, X: Palate elevates symmetrically. Phonation is normal. CN XI: Head turning and shoulder shrug are intact CN XII: Tongue is midline with normal movements and no atrophy.  MOTOR: Right hammertoe, moderate right toe flexion extension weakness, slight weakness on the left toe  REFLEXES: Reflexes are 2  and symmetric at the biceps, triceps, brisk at bilateral knee knees, and absent at ankles. Plantar responses are extensor bilaterally  SENSORY: Intact to light touch, pinprick, positional and vibratory sensation are intact in fingers and toes.  COORDINATION: Rapid alternating movements and fine finger movements are intact. There is no dysmetria on finger-to-nose and heel-knee-shin.    GAIT/STANCE: Push-up, cautious, wide-based, unsteady    REVIEW OF SYSTEMS:  Full 14 system review of systems performed and notable only for as above All other review of systems were negative.   ALLERGIES: Allergies  Allergen Reactions  Nsaids Other (See Comments)    GI Bleeding   Aspirin     Other Reaction(s): Not available   Cortisone     Pt stated, I am only allergic to cortisone injections  Other Reaction(s): Not available    HOME MEDICATIONS: Current Outpatient Medications  Medication  Sig Dispense Refill   allopurinol  (ZYLOPRIM ) 300 MG tablet Take 1.5 tablets (450 mg total) by mouth daily. (Patient taking differently: Take 150 mg by mouth daily.) 135 tablet 2   ammonium lactate (LAC-HYDRIN) 12 % lotion Apply 1 Application topically.     bisoprolol (ZEBETA) 5 MG tablet Take 5 mg by mouth daily.     buPROPion  (WELLBUTRIN  SR) 150 MG 12 hr tablet Take 1 tablet (150 mg total) by mouth See admin instructions. Taking with the Wellbutrin  300mg  dose for a total of 450mg  daily 90 tablet 3   buPROPion  (WELLBUTRIN  XL) 300 MG 24 hr tablet TAKE 1 TABLET (300 MG TOTAL) BY MOUTH DAILY. TAKING WITH WELLBUTRIN  SR 150MG  FOR 450MG  TOTAL DAILY. 90 tablet 0   clobetasol cream (TEMOVATE) 0.05 % APPLY TOPICALLY TO INVOLVED AREAS ONE TIME DAILY FOR 2 WEEKS, THEN FOR 1 WEEK OFF, AND THEN REPEAT     LORazepam  (ATIVAN ) 1 MG tablet Take 1 tablet (1 mg total) by mouth every 8 (eight) hours as needed for anxiety or sleep. 30 tablet 0   Multiple Vitamin (MULTIVITAMIN) capsule Take 1 capsule by mouth daily.     pantoprazole  (PROTONIX ) 40 MG tablet Take 1 tablet (40 mg total) by mouth daily. 90 tablet 3   sertraline  (ZOLOFT ) 100 MG tablet Take 1 tablet (100 mg total) by mouth daily. 90 tablet 2   tamsulosin  (FLOMAX ) 0.4 MG CAPS capsule TAKE 1 CAPSULE BY MOUTH EVERY DAY 90 capsule 1   traZODone  (DESYREL ) 100 MG tablet Take 1-2 tablets (100-200 mg total) by mouth at bedtime as needed. 180 tablet 2   No current facility-administered medications for this visit.    PAST MEDICAL HISTORY: Past Medical History:  Diagnosis Date   Abnormal ECG 09/19/2017   NSR. Left axis deviation.    Alcohol abuse    Anxiety    Depression    ED (erectile dysfunction)    GERD (gastroesophageal reflux disease)    History of kidney stones    Hypertension    Moderate tetrahydrocannabinol (THC) dependence (HCC)    Photodermatitis    Spinal stenosis    Urge incontinence     PAST SURGICAL HISTORY: Past Surgical History:   Procedure Laterality Date   CARPAL TUNNEL RELEASE Bilateral 1991   COLONOSCOPY     CYST REMOVAL TRUNK N/A 07/2014   back   ESOPHAGOGASTRODUODENOSCOPY     ROTATOR CUFF REPAIR Left 2003/2018   ROTATOR CUFF REPAIR Right 02/10/2014   Study: Echo  06/27/2016   LV size normal.  LV wall thickness normal.  LVEF 55-60%.  Diastolic filling pattern indicates impaired relaxation.  GLS test 17%.  LA is mildly dilated.  Moderate AV sclerosis without stenosis.  Trace MR.  RVSP 22.25 mmHg p   TRANSFORAMINAL LUMBAR INTERBODY FUSION (TLIF) WITH PEDICLE SCREW FIXATION 1 LEVEL N/A 11/22/2017   Procedure: TRANSFORAMINAL LUMBAR INTERBODY FUSION Lumbar four-five;  Surgeon: Burnetta Aures, MD;  Location: [facility] OR;  Service: Orthopedics;  Laterality: N/A;  4.5 hrs   ULNAR NERVE TRANSPOSITION Left 2018    FAMILY HISTORY: Family History  Problem Relation Age of Onset   Bipolar disorder Mother    Arthritis Mother    Depression Mother  Stroke Mother    Angina Father    Hypertension Father    Stroke Father    Lung cancer Brother    COPD Brother    Hearing loss Brother    Colon cancer Paternal Aunt    Bone cancer Paternal Uncle     SOCIAL HISTORY: Social History   Socioeconomic History   Marital status: Married    Spouse name: Not on file   Number of children: 2   Years of education: Not on file   Highest education level: Some college, no degree  Occupational History   Occupation: Radio broadcast assistant  Tobacco Use   Smoking status: Former    Current packs/day: 0.00    Types: Cigarettes    Start date: 1971    Quit date: 2002    Years since quitting: 23.7   Smokeless tobacco: Never  Vaping Use   Vaping status: Never Used  Substance and Sexual Activity   Alcohol use: Yes    Comment: drinks 2 cans of beer daily   Drug use: Yes    Types: Marijuana, Benzodiazepines    Comment: smokes marijuana - once in a while   Sexual activity: Yes    Partners: Female  Other Topics Concern   Not on file   Social History Narrative   Marital status/children/pets: married. Lives in [place], [place] 6 months out of the year (usually leaves in November yearly to [place] ).   Education/employment: Some college, Radio broadcast assistant.    Daily ETOH and THC use ( on records 02/2017).    Safety:      -smoke alarm in the home:Yes     - wears seatbelt: Yes     - Feels safe in their relationships: Yes               Right handed   Wear glasses    Drink 1-2 cups per day       Social Drivers of Health   Financial Resource Strain: Low Risk  (03/27/2023)   Overall Financial Resource Strain (CARDIA)    Difficulty of Paying Living Expenses: Not very hard  Food Insecurity: No Food Insecurity (03/27/2023)   Hunger Vital Sign    Worried About Running Out of Food in the Last Year: Never true    Ran Out of Food in the Last Year: Never true  Transportation Needs: No Transportation Needs (03/27/2023)   PRAPARE - Administrator, Civil Service (Medical): No    Lack of Transportation (Non-Medical): No  Physical Activity: Insufficiently Active (03/27/2023)   Exercise Vital Sign    Days of Exercise per Week: 1 day    Minutes of Exercise per Session: 30 min  Stress: No Stress Concern Present (03/27/2023)   [employer] of Occupational Health - Occupational Stress Questionnaire    Feeling of Stress : Only a little  Social Connections: Socially Isolated (03/27/2023)   Social Connection and Isolation Panel    Frequency of Communication with Friends and Family: Once a week    Frequency of Social Gatherings with Friends and Family: Once a week    Attends Religious Services: Never    Database administrator or Organizations: No    Attends Banker Meetings: Never    Marital Status: Married  Catering manager Violence: Not At Risk (03/27/2023)   Humiliation, Afraid, Rape, and Kick questionnaire    Fear of Current or Ex-Partner: No    Emotionally Abused: No    Physically Abused: No  Sexually Abused: No      Modena Callander, M.D. Ph.D.  [facility] [address] [place], [place] [address] Ph: [phone] Fax: [phone]  CC:  Cleotilde Lukes, DO [address] [place],  [place] [address]  Cleotilde Lukes, DO

## 2023-11-30 ENCOUNTER — Telehealth: Payer: Self-pay | Admitting: Neurology

## 2023-11-30 ENCOUNTER — Ambulatory Visit: Attending: Neurology

## 2023-11-30 DIAGNOSIS — R2689 Other abnormalities of gait and mobility: Secondary | ICD-10-CM | POA: Diagnosis not present

## 2023-11-30 DIAGNOSIS — R2681 Unsteadiness on feet: Secondary | ICD-10-CM | POA: Insufficient documentation

## 2023-11-30 DIAGNOSIS — R262 Difficulty in walking, not elsewhere classified: Secondary | ICD-10-CM | POA: Diagnosis not present

## 2023-11-30 DIAGNOSIS — R269 Unspecified abnormalities of gait and mobility: Secondary | ICD-10-CM | POA: Insufficient documentation

## 2023-11-30 NOTE — Therapy (Signed)
 OUTPATIENT PHYSICAL THERAPY NEURO TREATMENT NOTE and D/C Summary   Patient Name: Francisco Cowan MRN: [id number] DOB:1950/03/11, 73 y.o., male Today's Date: 11/30/2023   PCP: Cleotilde Lukes, DO REFERRING PROVIDER: Onita Duos, MD  PHYSICAL THERAPY DISCHARGE SUMMARY  Visits from Start of Care: 9  Current functional level related to goals / functional outcomes: Dynamic Gait Index 19/24 indicating low risk for falls   Remaining deficits: Proprioceptive/kinesthetic awareness deficits RLE > LLE and weakness from hx of lumbar radiculopathy   Education / Equipment: HEP and AFO recommendation   Patient agrees to discharge. Patient goals were partially met. Patient is being discharged due to being pleased with the current functional level.  END OF SESSION:  PT End of Session - 11/30/23 1101     Visit Number 9    Number of Visits 13    Date for Recertification  12/04/23    Authorization Type [employer]    Progress Note Due on Visit 10    PT Start Time 1100    PT Stop Time 1145    PT Time Calculation (min) 45 min    Equipment Utilized During Treatment Gait belt    Activity Tolerance Patient tolerated treatment well    Behavior During Therapy WFL for tasks assessed/performed                Past Medical History:  Diagnosis Date   Abnormal ECG 09/19/2017   NSR. Left axis deviation.    Alcohol abuse    Anxiety    Depression    ED (erectile dysfunction)    GERD (gastroesophageal reflux disease)    History of kidney stones    Hypertension    Moderate tetrahydrocannabinol (THC) dependence (HCC)    Photodermatitis    Spinal stenosis    Urge incontinence    Past Surgical History:  Procedure Laterality Date   CARPAL TUNNEL RELEASE Bilateral 1991   COLONOSCOPY     CYST REMOVAL TRUNK N/A 07/2014   back   ESOPHAGOGASTRODUODENOSCOPY     ROTATOR CUFF REPAIR Left 2003/2018   ROTATOR CUFF REPAIR Right 02/10/2014   Study: Echo  06/27/2016   LV size normal.  LV  wall thickness normal.  LVEF 55-60%.  Diastolic filling pattern indicates impaired relaxation.  GLS test 17%.  LA is mildly dilated.  Moderate AV sclerosis without stenosis.  Trace MR.  RVSP 22.25 mmHg p   TRANSFORAMINAL LUMBAR INTERBODY FUSION (TLIF) WITH PEDICLE SCREW FIXATION 1 LEVEL N/A 11/22/2017   Procedure: TRANSFORAMINAL LUMBAR INTERBODY FUSION Lumbar four-five;  Surgeon: Burnetta Aures, MD;  Location: [facility] OR;  Service: Orthopedics;  Laterality: N/A;  4.5 hrs   ULNAR NERVE TRANSPOSITION Left 2018   Patient Active Problem List   Diagnosis Date Noted   Gait abnormality 11/30/2023   OSA (obstructive sleep apnea) 08/30/2023   Bilateral carpal tunnel syndrome 08/10/2023   Fall 06/07/2023   Weakness 06/07/2023   Neuropathy 06/07/2023   Encounter to establish care with new doctor 01/19/2023   Obesity (BMI 30-39.9) 10/08/2018   Acute gout of left ankle 10/08/2018   Ingrown nail of second toe of left foot 10/08/2018   S/P lumbar fusion 11/22/2017   Allergy 10/12/2017   GERD (gastroesophageal reflux disease) 10/12/2017   Cervical stenosis of spine 10/12/2017   Depression, major, single episode, mild 10/12/2017   GAD (generalized anxiety disorder) 10/12/2017   Elevated glucose 10/11/2017   Essential hypertension 10/11/2017   Degeneration of lumbar intervertebral disc 08/01/2017   Degenerative lumbar spinal stenosis 08/01/2017  Degenerative spondylolisthesis 08/01/2017    ONSET DATE: 2 years  REFERRING DIAG: G62.9 (ICD-10-CM) - Neuropathy W19.XXXD (ICD-10-CM) - Fall, subsequent encounter R53.1 (ICD-10-CM) - Weakness  THERAPY DIAG:  Unsteadiness on feet  Other abnormalities of gait and mobility  Difficulty in walking, not elsewhere classified  Rationale for Evaluation and Treatment: Rehabilitation  SUBJECTIVE:                                                                                                                                                                                              SUBJECTIVE STATEMENT: Will be receiving AFO in about 4 weeks   Pt accompanied by: self  PERTINENT HISTORY: Francisco Cowan is a 73 y.o. male   2 years history of lightheadedness when standing up, unsteady gait, falling episode Neck pain, radiating pain along his spine, left upper extremity  PAIN:  Are you having pain? No and Yes: NPRS scale: 5-6/10 Pain location: neck and RUE Pain description: click Aggravating factors: unsure Relieving factors: unsure  PRECAUTIONS: Fall  RED FLAGS: Notes some instances of urinary incontinence-urgency--denies any saddle anesthesia    WEIGHT BEARING RESTRICTIONS: No  FALLS: Has patient fallen in last 6 months? Yes. Number of falls at least 6-7 times--notes falling to the right more commonly and feels RLE weakness is more pronounced.   LIVING ENVIRONMENT: Lives with: lives with their family Lives in: House/apartment Stairs: ramp entrance, 1 level set-up Has following equipment at home: hurrycane, walking stick, RW, tub/shower w/ grab bars  PLOF: Independent, reports using AE for buttons  PATIENT GOALS: not fall down  OBJECTIVE:   TODAY'S TREATMENT: 11/30/23 Activity Comments  DGI 19/24  ABC scale 52.5%  M-ctsib Mod-severe x 12 sec condition 4  HEP review             *Pt reports not wearing his brace today   TODAY'S TREATMENT: 11/28/23 Activity Comments  standing on foam EC on foam Ant/pos wt shifting on foam Mild-mod sway. Difficulty shifting anteriorly onto toes  marching on foam B UE support; cues for larger amplitude and wt shift anteriorly   Forward step and weightshift over obstacles Side step over obstacles Decreased R foot clearance at times, uses vision to assist with position/clearance  Corner balance exercises:  reviewed feet together EC head turns/nods Mod sway  Corner balance feet stagger stance with EO head turns/nods EC x 15-30 sec Mild-mod sway     alt toe tap to 8 step, on Airex  Less stability with R LE stabilizing   Outdoor gait with walking pole -head turns/nods on level ground -negotiating sidewalk and patio surfaces Mod I,  no LOB, he does tend to look down    HOME EXERCISE PROGRAM: Access Code: [email or web address] URL: [email or web address] Date: 11/28/2023 Prepared by: [facility] - Outpatient  Rehab - [facility]  Exercises - Corner Balance Feet Together With Eyes Closed  - 1 x daily - 7 x weekly - 3 sets - 5-10 reps - Sit to Stand  - 1 x daily - 7 x weekly - 3 sets - 5 reps - Staggered Stance Forward Backward Weight Shift with Counter Support  - 1 x daily - 7 x weekly - 3 sets - 10 reps - Standing Hip Flexion AROM (Mirrored)  - 1 x daily - 7 x weekly - 2 sets - 10 reps - Alternating Heel Raises  - 1 x daily - 7 x weekly - 2 sets - 10 reps - Supine ITB Stretch with Strap  - 1 x daily - 5 x weekly - 2 sets - 30 sec hold - Modified Thomas Stretch  - 1 x daily - 5 x weekly - 2 sets - 30 sec hold - Semi-Tandem Corner Balance With Eyes Closed  - 1 x daily - 5 x weekly - 1 sets - 3 reps - 15-30 sec hold - Semi-Tandem Corner Balance: Eyes Open With Head Turns  - 1 x daily - 5 x weekly - 1 sets - 5 reps  PATIENT EDUCATION: Education details: HEP update, plans for likely discharge next visit Person educated: Patient Education method: Explanation, Demonstration, Tactile cues, Verbal cues, and Handouts Education comprehension: verbalized understanding and returned demonstration      ----------------------------------------------------- Note: Objective measures were completed at Evaluation unless otherwise noted.  DIAGNOSTIC FINDINGS:   Per Dr. 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, only mild chronic lumbosacral radiculopathy, fairly symmetric left and right,             Referred to physical therapy             Get MRI cervical from [facility]  Blood pressure lying down 136/78, heart rate of 60 Sitting up 139/84 heart rate of 64 Standing up 127/74 heart rate of 62 Standing up for 1 minute 124/77 heart rate of 60  COGNITION: Overall cognitive status: Within functional limits for tasks assessed   SENSATION: Notes a stocking/glove distrubution of numbness/tingling of extremities   COORDINATION: Difficulty with rapid alternating movements, RUE> LUE  MUSCLE TONE: possibly 2 beat clonus right ankle  POSTURE: No Significant postural limitations  LOWER EXTREMITY ROM:     WNL  LOWER EXTREMITY MMT:    Strength is grossly 5/5 to seated resisted tests/myotomes BLE BED MOBILITY:  Independent  TRANSFERS: Sit to stand: Complete Independence  Assistive device utilized: None     Stand to sit: Complete Independence  Assistive device utilized: None     Chair to chair: Complete Independence  Assistive device utilized: None       RAMP:    CURB:  Not tested  STAIRS: Not  tested GAIT: Findings: Gait Characteristics: wide BOS and Comments: unsteady with turns  FUNCTIONAL TESTS:  Eval: 5 times sit to stand: TBD 10 meter walk test: TBD Berg Balance Test: 46/56 Dynamic Gait Index: TBD  M-CTSIB  Condition 1: Firm Surface, EO 30 Sec, Mild Sway  Condition 2: Firm Surface, EC 30 Sec, Moderate Sway  Condition 3: Foam Surface, EO 30 Sec, Mild Sway  Condition 4: Foam Surface, EC 18 Sec, Moderate and Severe Sway   11/02/23: 47M walk:  11.31 sec = 2.9 ft/sec   DGI:  17/24 Uses walking pole; scores <19/24 indicate increased fall risk  ABC scale = 51.25 %   5x sit to stand:  16.18 sec  From 18 mat height    PATIENT  SURVEYS:  ABC scale: TBD                                                                                                                         GOALS: Goals reviewed with patient? Yes  SHORT TERM GOALS: Target date: 11/13/2023    Patient will be independent in HEP to improve functional outcomes Baseline: reviewed today and required cueing d/t poor recall 11/21/23 Goal status: NOT MET 11/21/23  2.  Demo improved postural control per mild sway x 30 sec condition 4 M-CTSIB for improved safety during ADL Baseline: 18 sec mod-severe; 10 sec with moderate sway 11/21/23 Goal status: NOT MET 11/21/23  3.  Demo improved standing balance per score 50/56 Berg Balance Test for improved safety during ADL Baseline: 46/56; 51/56 11/21/23 Goal status: MET 11/21/23    LONG TERM GOALS: Target date: 12/04/2023    Dynamic Gait Index score to improve to 20/24 for decreased fall risk. Baseline: 17/24; 19/24 Goal status: NOT MET  2.  ABC scale to improve to at least 60% for improved balance confidence. Baseline: 51.25%; 52.5% Goal status: NOT MET  3.  Modified independent ambulation uneven surfaces to improve safety in community Baseline: doing more of this with walking pole, mod I Goal status: MET, 11/28/2023  4.  Gait speed, 10 meter walk Baseline: 2.9 ft/sec; 3.28 ft/sec Goal status: MET    ASSESSMENT:  CLINICAL IMPRESSION: POC performance and review w/ 2-point improvement DGI from baseline 17/24 to 19/24 indicating low risk for falls. Demo improved gait speed from 2.9 to 3.28 ft/sec level surfaces. Pt reports improved confidence in activities per ABC scale but not a significant difference. Pt will be undergoing further medical/surgical mgmt at this time and will D/C from PT services.  OBJECTIVE IMPAIRMENTS: Abnormal gait, decreased activity tolerance, decreased balance, decreased coordination, decreased endurance, decreased knowledge of use of DME, difficulty walking, impaired sensation, and  impaired UE functional use.   ACTIVITY LIMITATIONS: carrying, lifting, standing, stairs, transfers, reach over head, and locomotion level  PARTICIPATION LIMITATIONS: meal prep, cleaning, laundry, interpersonal relationship, shopping, community activity, and yard work  PERSONAL FACTORS: Age, Time since onset of injury/illness/exacerbation, and 1-2 comorbidities: PMH, hx of lumbar sx are also affecting  patient's functional outcome.   REHAB POTENTIAL: Good  CLINICAL DECISION MAKING: Evolving/moderate complexity  EVALUATION COMPLEXITY: Moderate  PLAN:  PT FREQUENCY: 2x/week  PT DURATION: 6 weeks  PLANNED INTERVENTIONS: 97750- Physical Performance Testing, 97110-Therapeutic exercises, 97530- Therapeutic activity, [id number]- Neuromuscular re-education, 97535- Self Care, 02859- Manual therapy, [id number]- Gait training, [phone]- Aquatic Therapy, [phone]- Electrical stimulation (unattended), and 20560 (1-2 muscles), 20561 (3+ muscles)- Dry Needling  PLAN FOR NEXT SESSION: D/C to HEP and f/u as indicated    12:28 PM, 11/30/23 M. Kelly Zhana Jeangilles, PT, DPT Physical Therapist- [place] Office Number: [phone]

## 2023-11-30 NOTE — Telephone Encounter (Signed)
 Referral  for Orthopedics faxed  To [facility] Phone:[phone] Fax: [phone]

## 2023-12-08 DIAGNOSIS — Z981 Arthrodesis status: Secondary | ICD-10-CM | POA: Diagnosis not present

## 2023-12-08 DIAGNOSIS — M48061 Spinal stenosis, lumbar region without neurogenic claudication: Secondary | ICD-10-CM | POA: Diagnosis not present

## 2023-12-08 DIAGNOSIS — M545 Low back pain, unspecified: Secondary | ICD-10-CM | POA: Diagnosis not present

## 2023-12-08 DIAGNOSIS — M4310 Spondylolisthesis, site unspecified: Secondary | ICD-10-CM | POA: Diagnosis not present

## 2023-12-12 ENCOUNTER — Ambulatory Visit: Admitting: Family Medicine

## 2023-12-18 NOTE — Progress Notes (Signed)
 [facility] Navigation Program: Pre-Consult Assessment (PCA)  Patient Story: Francisco Cowan is a pleasant 73 yo who presents to [facility] for evaluation. He moved to [place] about a year ago from [place], [place] and he now lives with his wife in [place], [place]. He shared that most of his family is spread out across the [place] , but he has an adequate network of support. He is a retired Radio broadcast assistant and describes himself as just an average guy. Francisco Cowan does endorse lots of falls this past year which he attributes to his his back problems. He uses a cane during ambulation and shared the he is scheduled for upcoming back surgery to address some of his issues.   OPN reviewed logistics for upcoming appointment including registration process, parking options, etc. He was encouraged to use the [facility] app to assist with navigation/way finding if needed. Francisco Cowan expressed appreciation for outreach and denies any additional questions at this time. He has this OPN's contact information if needed prior to appointment.   Completed with: Patient Recruitment consultant Access and Use : is an active Restaurant manager, fast food - How often the internet is accessed : Every Day  Preferred Communication: MyChart and Phone Social Support System: Yes The following are who provide support :Spouse or Partner, Child/Children, and Other family members Transportation plan for consult visit: personal Librarian, academic Resources Strain: not present Current nutrition or appetite concerns: No change in appetite and No change in weight  Education/Referrals/Interventions: Coping/Emotional Support, Nutritional therapist, Visit Prep  Oncology Patient Navigator (OPN) completed a proactive outreach call. OPN provided a warm welcome to [facility] and reviewed the role of OPN. OPN conducted initial evaluation of potential barriers and reinforced expectations for upcoming appointment including general education regarding: appt flow, clinic  environment, care team, and appropriate points of contact. OPN encouraged patient to return to PCP or referring physician if any medical needs arise prior to consult.  Our team is available to assist patient with any possible non-medical barriers that may impact the patient for the consult appointment.  Navigation Care Plan: Patient verbalized understanding of information provided and expressed appreciation for proactive support. If the patient pursues treatment delivered at participating [facility] locations, the OPN will provide further/ongoing patient, family, and care partner resource education, support and referral for non-medical needs.

## 2023-12-19 ENCOUNTER — Encounter: Payer: Self-pay | Admitting: Family Medicine

## 2023-12-19 ENCOUNTER — Ambulatory Visit (INDEPENDENT_AMBULATORY_CARE_PROVIDER_SITE_OTHER): Admitting: Family Medicine

## 2023-12-19 VITALS — BP 128/62 | HR 60 | Ht 68.0 in | Wt 204.0 lb

## 2023-12-19 DIAGNOSIS — Z01818 Encounter for other preprocedural examination: Secondary | ICD-10-CM

## 2023-12-19 DIAGNOSIS — G629 Polyneuropathy, unspecified: Secondary | ICD-10-CM

## 2023-12-19 DIAGNOSIS — Z23 Encounter for immunization: Secondary | ICD-10-CM | POA: Diagnosis not present

## 2023-12-19 DIAGNOSIS — F320 Major depressive disorder, single episode, mild: Secondary | ICD-10-CM | POA: Diagnosis not present

## 2023-12-19 DIAGNOSIS — M1A9XX0 Chronic gout, unspecified, without tophus (tophi): Secondary | ICD-10-CM

## 2023-12-19 MED ORDER — BUPROPION HCL ER (XL) 150 MG PO TB24: 150.0000 mg | ORAL_TABLET | Freq: Every day | ORAL | 0 refills | Status: DC

## 2023-12-19 MED ORDER — ALLOPURINOL 300 MG PO TABS: 150.0000 mg | ORAL_TABLET | Freq: Every day | ORAL | 1 refills | Status: AC

## 2023-12-19 NOTE — Progress Notes (Addendum)
   SUBJECTIVE:   CHIEF COMPLAINT / HPI:  Discussed the use of AI scribe software for clinical note transcription with the patient, who gave verbal consent to proceed.  History of Present Illness Francisco Cowan is a 73 year old male who presents for preoperative risk evaluation for XLIF at L3-4 to address the adjacent segment degenerative disease and foraminal stenosis.   Preoperative evaluation for lumbar fusion surgery - Able to walk up a flight of stairs and two blocks without chest pain or shortness of breath - Blood pressure is well-controlled - No chest pain - No shortness of breath - No history of easy bruising or bleeding disorders - Patient does not smoke or drink alcohol.  - A1c on 04/16/23 5.2  Neuropathic symptoms - Uses Celebrex as needed for neuropathy - Previously tried gabapentin, which caused side effects of loopiness  - Duloxetine has not been tried, as patient is on many other serotonergic medications  - Has undergone nerve conduction studies for upper extremity neuropathic symptoms. Currently following with Neurologist. Thought to be due to cervical stenosis. Awaiting surgical management for this.  - No B12 deficiency   Cutaneous t-cell lymphoma - Upcoming appointment at [facility] in November  Obstructive sleep apnea - Uses CPAP machine, well controlled.   Psychiatric and sleep medications - Takes bupropion  150 mg twice daily - Takes Zoloft  100 mg - Takes trazodone  nightly - Uses Ativan  as needed (once a week)   Other chronic medications - Takes bisoprolol in the evenings - Takes Flomax  - Takes Protonix  - Takes a multivitamin    PERTINENT  PMH / PSH: OSA, HTN, Lumbar stenosis.   OBJECTIVE:  BP 128/62   Pulse 60   Ht 5' 8 (1.727 m)   Wt 204 lb (92.5 kg)   SpO2 98%   BMI 31.02 kg/m    General: well appearing, in no acute distress CV: RRR, radial pulses equal and palpable, no BLE edema  Resp: Normal work of breathing on room air,  CTAB Abd: Soft, non tender, non distended  Neuro: Alert & Oriented x 4   ASSESSMENT/PLAN:   Assessment & Plan Preoperative examination Low risk patient for intermediate risk surgery. Patient otherwise well. No need for further labs or cardiac testing. Blood pressure controlled. No bleeding disorders.  - CPAP use for sleep apnea noted and well controlled. Recommend patient discuss with anaesthesia and bring CPAP to hospital.  - Hold Celebrex 5-7 days pre-surgery. - Hold flomax  day of surgery  Neuropathy Most likely due to cervical stenosis and is currently being followed by neurology.  - Discontinued gabapentin due to patient preference due to side effects.  - Unfortunately not a good candidate for duloxotine given serotonergic medication burden.  - Continue celebrex as needed, counseled on risks of long term NSAID use. Hold before the surgery.  - Follow up with neurology and orthopedics regarding surgical evaluation.    Areta Saliva, MD [facility]

## 2023-12-19 NOTE — Assessment & Plan Note (Signed)
 Most likely due to cervical stenosis and is currently being followed by neurology.  - Discontinued gabapentin due to patient preference due to side effects.  - Unfortunately not a good candidate for duloxotine given serotonergic medication burden.  - Continue celebrex as needed, counseled on risks of long term NSAID use. Hold before the surgery.  - Follow up with neurology and orthopedics regarding surgical evaluation.

## 2023-12-19 NOTE — Patient Instructions (Addendum)
 It was wonderful to see you today.  Please bring ALL of your medications with you to every visit.   Today we talked about:  Preop risk assessment -your orthopedic surgery has intermittent risk.  Otherwise you do have lower risk of cardiac events during the surgery as you are in relatively good health.  You can continue your blood pressure medication for the surgery.  Please hold your Celebrex for at least 5 days before the surgery and hold your Flomax  the day of the surgery.  Mention that you have sleep apnea to the anesthesiologist and take your CPAP with you to the hospital.  Please follow up in 3 months for an annual physical   Thank you for choosing [facility].   Please call [phone] with any questions about today's appointment.  Please be sure to schedule follow up at the front desk before you leave today.   Areta Saliva, MD  Family Medicine

## 2023-12-22 DIAGNOSIS — H9123 Sudden idiopathic hearing loss, bilateral: Secondary | ICD-10-CM | POA: Diagnosis not present

## 2023-12-22 DIAGNOSIS — H903 Sensorineural hearing loss, bilateral: Secondary | ICD-10-CM | POA: Diagnosis not present

## 2023-12-25 DIAGNOSIS — M21371 Foot drop, right foot: Secondary | ICD-10-CM | POA: Diagnosis not present

## 2023-12-28 ENCOUNTER — Other Ambulatory Visit: Payer: Self-pay | Admitting: Medical Genetics

## 2023-12-28 DIAGNOSIS — Z006 Encounter for examination for normal comparison and control in clinical research program: Secondary | ICD-10-CM

## 2024-01-02 ENCOUNTER — Encounter: Payer: Self-pay | Admitting: Family Medicine

## 2024-01-03 ENCOUNTER — Encounter: Payer: Self-pay | Admitting: Family Medicine

## 2024-01-10 ENCOUNTER — Ambulatory Visit (HOSPITAL_COMMUNITY): Payer: Self-pay | Admitting: Physician Assistant

## 2024-01-11 DIAGNOSIS — C84A0 Cutaneous T-cell lymphoma, unspecified, unspecified site: Secondary | ICD-10-CM | POA: Diagnosis not present

## 2024-01-11 DIAGNOSIS — D72820 Lymphocytosis (symptomatic): Secondary | ICD-10-CM | POA: Diagnosis not present

## 2024-01-17 ENCOUNTER — Ambulatory Visit (HOSPITAL_COMMUNITY): Payer: Self-pay | Admitting: Physician Assistant

## 2024-01-18 LAB — GENECONNECT MOLECULAR SCREEN

## 2024-01-19 ENCOUNTER — Encounter: Payer: Self-pay | Admitting: Family Medicine

## 2024-01-22 ENCOUNTER — Telehealth: Payer: Self-pay | Admitting: Medical Genetics

## 2024-01-22 MED ORDER — BISOPROLOL FUMARATE 5 MG PO TABS: 5.0000 mg | ORAL_TABLET | Freq: Every day | ORAL | 0 refills | Status: AC

## 2024-01-22 NOTE — Pre-Procedure Instructions (Signed)
 Surgical Instructions   Your procedure is scheduled on February 01, 2024. Report to [facility] Main Entrance A at 5:30 A.M., then check in with the Admitting office. Any questions or running late day of surgery: call [phone]  Questions prior to your surgery date: call [phone], Monday-Friday, 8am-4pm. If you experience any cold or flu symptoms such as cough, fever, chills, shortness of breath, etc. between now and your scheduled surgery, please notify us  at the above number.     Remember:  Do not eat after midnight the night before your surgery  You may drink clear liquids until 4:30AM the morning of your surgery.   Clear liquids allowed are: Water, Non-Citrus Juices (without pulp), Carbonated Beverages, Clear Tea (no milk, honey, etc.), Black Coffee Only (NO MILK, CREAM OR POWDERED CREAMER of any kind), and Gatorade.    Take these medicines the morning of surgery with A SIP OF WATER: allopurinol  (ZYLOPRIM )  amLODipine  (NORVASC )  bisoprolol  (ZEBETA ) buPROPion  (WELLBUTRIN  XL)  cetirizine (ZYRTEC)  pantoprazole  (PROTONIX )  sertraline  (ZOLOFT )  tamsulosin  (FLOMAX )   May take these medicines IF NEEDED: LORazepam  (ATIVAN )   One week prior to surgery, STOP taking any Aspirin (unless otherwise instructed by your surgeon) Aleve, Naproxen, Ibuprofen, Motrin, Advil, Goody's, BC's, all herbal medications, fish oil, and non-prescription vitamins, including celecoxib  (CELEBREX )                      Do NOT Smoke (Tobacco/Vaping) for 24 hours prior to your procedure.  If you use a CPAP at night, you may bring your mask/headgear for your overnight stay.   You will be asked to remove any contacts, glasses, piercing's, hearing aid's, dentures/partials prior to surgery. Please bring cases for these items if needed.    Patients discharged the day of surgery will not be allowed to drive home, and someone needs to stay with them for 24 hours.  SURGICAL WAITING ROOM VISITATION Patients  may have no more than 2 support people in the waiting area - these visitors may rotate.   Pre-op nurse will coordinate an appropriate time for 1 ADULT support person, who may not rotate, to accompany patient in pre-op.  Children under the age of 49 must have an adult with them who is not the patient and must remain in the main waiting area with an adult.  If the patient needs to stay at the hospital during part of their recovery, the visitor guidelines for inpatient rooms apply.  Please refer to the [facility] website for the visitor guidelines for any additional information.   If you received a COVID test during your pre-op visit  it is requested that you wear a mask when out in public, stay away from anyone that may not be feeling well and notify your surgeon if you develop symptoms. If you have been in contact with anyone that has tested positive in the last 10 days please notify you surgeon.      Pre-operative 4 CHG Bathing Instructions   You can play a key role in reducing the risk of infection after surgery. Your skin needs to be as free of germs as possible. You can reduce the number of germs on your skin by washing with CHG (chlorhexidine  gluconate) soap before surgery. CHG is an antiseptic soap that kills germs and continues to kill germs even after washing.   DO NOT use if you have an allergy to chlorhexidine /CHG or antibacterial soaps. If your skin becomes reddened or irritated, stop using  the CHG and notify one of our RNs at [phone].   Please shower with the CHG soap starting 4 days before surgery using the following schedule:     Please keep in mind the following:  DO NOT shave, including legs and underarms, starting the day of your first shower.   You may shave your face at any point before/day of surgery.  Place clean sheets on your bed the day you start using CHG soap. Use a clean washcloth (not used since being washed) for each shower. DO NOT sleep with pets once  you start using the CHG.   CHG Shower Instructions:  Wash your face and private area with normal soap. If you choose to wash your hair, wash first with your normal shampoo.  After you use shampoo/soap, rinse your hair and body thoroughly to remove shampoo/soap residue.  Turn the water OFF and apply  bottle of CHG soap to a CLEAN washcloth.  Apply CHG soap ONLY FROM YOUR NECK DOWN TO YOUR TOES (washing for 3-5 minutes)  DO NOT use CHG soap on face, private areas, open wounds, or sores.  Pay special attention to the area where your surgery is being performed.  If you are having back surgery, having someone wash your back for you may be helpful. Wait 2 minutes after CHG soap is applied, then you may rinse off the CHG soap.  Pat dry with a clean towel  Put on clean clothes/pajamas   If you choose to wear lotion, please use ONLY the CHG-compatible lotions that are listed below.  Additional instructions for the day of surgery:  If you choose, you may shower the morning of surgery with an antibacterial soap.  DO NOT APPLY any lotions, deodorants, cologne, or perfumes.   Do not bring valuables to the hospital. [facility] is not responsible for any belongings/valuables. Do not wear nail polish, gel polish, artificial nails, or any other type of covering on natural nails (fingers and toes) Do not wear jewelry or makeup Put on clean/comfortable clothes.  Please brush your teeth.  Ask your nurse before applying any prescription medications to the skin.     CHG Compatible Lotions   Aveeno Moisturizing lotion  Cetaphil Moisturizing Cream  Cetaphil Moisturizing Lotion  Clairol Herbal Essence Moisturizing Lotion, Dry Skin  Clairol Herbal Essence Moisturizing Lotion, Extra Dry Skin  Clairol Herbal Essence Moisturizing Lotion, Normal Skin  Curel Age Defying Therapeutic Moisturizing Lotion with Alpha Hydroxy  Curel Extreme Care Body Lotion  Curel Soothing Hands Moisturizing Hand Lotion  Curel  Therapeutic Moisturizing Cream, Fragrance-Free  Curel Therapeutic Moisturizing Lotion, Fragrance-Free  Curel Therapeutic Moisturizing Lotion, Original Formula  Eucerin Daily Replenishing Lotion  Eucerin Dry Skin Therapy Plus Alpha Hydroxy Crme  Eucerin Dry Skin Therapy Plus Alpha Hydroxy Lotion  Eucerin Original Crme  Eucerin Original Lotion  Eucerin Plus Crme Eucerin Plus Lotion  Eucerin TriLipid Replenishing Lotion  Keri Anti-Bacterial Hand Lotion  Keri Deep Conditioning Original Lotion Dry Skin Formula Softly Scented  Keri Deep Conditioning Original Lotion, Fragrance Free Sensitive Skin Formula  Keri Lotion Fast Absorbing Fragrance Free Sensitive Skin Formula  Keri Lotion Fast Absorbing Softly Scented Dry Skin Formula  Keri Original Lotion  Keri Skin Renewal Lotion Keri Silky Smooth Lotion  Keri Silky Smooth Sensitive Skin Lotion  Nivea Body Creamy Conditioning Oil  Nivea Body Extra Enriched Teacher, Adult Education Moisturizing Lotion Nivea Crme  Nivea Skin Firming Lotion  NutraDerm 30 Skin Lotion  NutraDerm Skin Lotion  NutraDerm Therapeutic Skin Cream  NutraDerm Therapeutic Skin Lotion  ProShield Protective Hand Cream  Provon moisturizing lotion  Please read over the following fact sheets that you were given.

## 2024-01-23 ENCOUNTER — Encounter (HOSPITAL_COMMUNITY): Payer: Self-pay

## 2024-01-23 ENCOUNTER — Encounter (HOSPITAL_COMMUNITY)
Admission: RE | Admit: 2024-01-23 | Discharge: 2024-01-23 | Disposition: A | Discharge status: Home / Self Care | Source: Ambulatory Visit | Attending: Orthopedic Surgery | Admitting: Orthopedic Surgery

## 2024-01-23 ENCOUNTER — Other Ambulatory Visit: Payer: Self-pay | Admitting: Medical Genetics

## 2024-01-23 ENCOUNTER — Other Ambulatory Visit: Payer: Self-pay

## 2024-01-23 VITALS — BP 139/76 | HR 63 | Temp 98.3°F | Resp 18 | Ht 68.0 in | Wt 203.3 lb

## 2024-01-23 DIAGNOSIS — Z006 Encounter for examination for normal comparison and control in clinical research program: Secondary | ICD-10-CM

## 2024-01-23 DIAGNOSIS — Z01818 Encounter for other preprocedural examination: Secondary | ICD-10-CM | POA: Diagnosis not present

## 2024-01-23 DIAGNOSIS — I10 Essential (primary) hypertension: Secondary | ICD-10-CM | POA: Insufficient documentation

## 2024-01-23 HISTORY — DX: Cutaneous T-cell lymphoma, unspecified, unspecified site: C84.A0

## 2024-01-23 HISTORY — DX: Sleep apnea, unspecified: G47.30

## 2024-01-23 HISTORY — DX: Polyneuropathy, unspecified: G62.9

## 2024-01-23 LAB — TYPE AND SCREEN
ABO/RH(D): A POS
Antibody Screen: NEGATIVE

## 2024-01-23 LAB — SURGICAL PCR SCREEN
MRSA, PCR: NEGATIVE
Staphylococcus aureus: NEGATIVE

## 2024-01-23 NOTE — Progress Notes (Signed)
 PCP - Cleotilde Lukes, DO  Cardiologist - denies  PPM/ICD - N/A  Chest x-ray - N/A EKG - 01/23/2024  Stress Test - pt reports having a stress tests over 10 years ago that was normal ECHO - 06/27/16 Cardiac Cath - denies  Sleep Study - wears CPAP   Fasting Blood Sugar -  N/A  Last dose of GLP1 agonist-  N/A   Blood Thinner Instructions: N/A Aspirin Instructions:N/A  ERAS Protcol - ERAS per order  COVID TEST-  N/A   Anesthesia review: yes- pt has medical clearance note in epic. Review EKG.   Patient denies shortness of breath, fever, cough and chest pain at PAT appointment   All instructions explained to the patient, with a verbal understanding of the material. Patient agrees to go over the instructions while at home for a better understanding. The opportunity to ask questions was provided.

## 2024-01-23 NOTE — Telephone Encounter (Signed)
 [facility]  01/23/2024 3:46 PM  Confirmed I was speaking with Francisco Cowan [phone] by using name and DOB. Informed participant the reason for this call is to follow-up on a recent sample the participant provided at one of the [facility] lab locations. Informed participant the test was not able to be completed with this sample and apologized for the inconvenience. Participant was requested to provide a new sample at one of our participating labs at no cost so that participant can continue participation and receive test results. Informed participant they do not need to be fasting and if there are other samples that need to be drawn, they can be done at the same visit. Participant has not had a blood transfusion or blood product in the last 30 days. Participant agreed to provide another sample. Participant was provided the [employer] program website to learn why this may have happened. Participant was thanked for their time and continued support of the above study.    Jordyn Pennstrom, BS [place]  [facility] Clinical Research Specialist II Direct Dial: [phone]  Fax: [phone]

## 2024-01-25 DIAGNOSIS — G4733 Obstructive sleep apnea (adult) (pediatric): Secondary | ICD-10-CM | POA: Diagnosis not present

## 2024-01-29 DIAGNOSIS — M51369 Other intervertebral disc degeneration, lumbar region without mention of lumbar back pain or lower extremity pain: Secondary | ICD-10-CM | POA: Diagnosis not present

## 2024-01-29 DIAGNOSIS — M4316 Spondylolisthesis, lumbar region: Secondary | ICD-10-CM | POA: Diagnosis not present

## 2024-01-31 NOTE — Anesthesia Preprocedure Evaluation (Signed)
 Anesthesia Evaluation  Patient identified by MRN, date of birth, ID band Patient awake    Reviewed: Allergy & Precautions, NPO status , Patient's Chart, lab work & pertinent test results  History of Anesthesia Complications Negative for: history of anesthetic complications  Airway Mallampati: III  TM Distance: >3 FB Neck ROM: Full    Dental no notable dental hx. (+) Teeth Intact   Pulmonary sleep apnea and Continuous Positive Airway Pressure Ventilation , neg COPD, Patient abstained from smoking.Not current smoker, former smoker   Pulmonary exam normal breath sounds clear to auscultation       Cardiovascular Exercise Tolerance: Good METShypertension, Pt. on medications (-) CAD and (-) Past MI (-) dysrhythmias  Rhythm:Regular Rate:Normal - Systolic murmurs    Neuro/Psych  PSYCHIATRIC DISORDERS Anxiety Depression    negative neurological ROS     GI/Hepatic ,GERD  ,,(+)     (-) substance abuse    Endo/Other  neg diabetes    Renal/GU negative Renal ROS     Musculoskeletal  (+) Arthritis ,    Abdominal   Peds  Hematology   Anesthesia Other Findings Past Medical History: 09/19/2017: Abnormal ECG     Comment:  NSR. Left axis deviation.  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 No date: Sleep apnea     Comment:  uses CPAP No date: Spinal stenosis No date: Urge incontinence  Reproductive/Obstetrics                              Anesthesia Physical Anesthesia Plan  ASA: 2  Anesthesia Plan: General   Post-op Pain Management: Dilaudid  IV, Ketamine IV*  and Tylenol  PO (pre-op)*   Induction: Intravenous  PONV Risk Score and Plan: 3 and Midazolam , TIVA, Propofol  infusion, Dexamethasone  and Ondansetron   Airway Management Planned: Oral ETT  Additional Equipment: None  Intra-op Plan:   Post-operative Plan: Extubation in OR  Informed Consent: I have reviewed the patients History and Physical, chart, labs and discussed the procedure including the risks, benefits and alternatives for the proposed anesthesia with the patient or authorized representative who has indicated his/her understanding and acceptance.     Dental advisory given  Plan Discussed with: CRNA and Surgeon  Anesthesia Plan Comments: (Discussed risks of anesthesia with patient, including PONV, sore throat, lip/dental/eye damage. Rare risks discussed as well, such as cardiorespiratory and neurological sequelae, and allergic reactions. Discussed the role of CRNA in patient's perioperative care. Patient understands.)         Anesthesia Quick Evaluation

## 2024-02-01 ENCOUNTER — Encounter (HOSPITAL_COMMUNITY): Payer: Self-pay | Admitting: Orthopedic Surgery

## 2024-02-01 ENCOUNTER — Other Ambulatory Visit: Payer: Self-pay

## 2024-02-01 ENCOUNTER — Inpatient Hospital Stay (HOSPITAL_COMMUNITY): Payer: Self-pay | Admitting: Anesthesiology

## 2024-02-01 ENCOUNTER — Inpatient Hospital Stay (HOSPITAL_COMMUNITY)

## 2024-02-01 ENCOUNTER — Inpatient Hospital Stay (HOSPITAL_COMMUNITY)
Admission: RE | Admit: 2024-02-01 | Discharge: 2024-02-06 | DRG: 451 | Disposition: A | Attending: Orthopedic Surgery | Admitting: Orthopedic Surgery

## 2024-02-01 ENCOUNTER — Inpatient Hospital Stay (HOSPITAL_COMMUNITY): Payer: Self-pay | Admitting: Physician Assistant

## 2024-02-01 ENCOUNTER — Encounter (HOSPITAL_COMMUNITY): Admission: RE | Disposition: A | Payer: Self-pay | Source: Home / Self Care | Attending: Orthopedic Surgery

## 2024-02-01 DIAGNOSIS — K219 Gastro-esophageal reflux disease without esophagitis: Secondary | ICD-10-CM | POA: Diagnosis not present

## 2024-02-01 DIAGNOSIS — Z886 Allergy status to analgesic agent status: Secondary | ICD-10-CM | POA: Diagnosis not present

## 2024-02-01 DIAGNOSIS — Z818 Family history of other mental and behavioral disorders: Secondary | ICD-10-CM | POA: Diagnosis not present

## 2024-02-01 DIAGNOSIS — Z87891 Personal history of nicotine dependence: Secondary | ICD-10-CM | POA: Diagnosis not present

## 2024-02-01 DIAGNOSIS — I471 Supraventricular tachycardia, unspecified: Secondary | ICD-10-CM | POA: Diagnosis not present

## 2024-02-01 DIAGNOSIS — Z981 Arthrodesis status: Secondary | ICD-10-CM | POA: Diagnosis not present

## 2024-02-01 DIAGNOSIS — I4510 Unspecified right bundle-branch block: Secondary | ICD-10-CM | POA: Diagnosis not present

## 2024-02-01 DIAGNOSIS — M109 Gout, unspecified: Secondary | ICD-10-CM | POA: Diagnosis not present

## 2024-02-01 DIAGNOSIS — Z825 Family history of asthma and other chronic lower respiratory diseases: Secondary | ICD-10-CM | POA: Diagnosis not present

## 2024-02-01 DIAGNOSIS — I10 Essential (primary) hypertension: Secondary | ICD-10-CM | POA: Diagnosis not present

## 2024-02-01 DIAGNOSIS — F1290 Cannabis use, unspecified, uncomplicated: Secondary | ICD-10-CM | POA: Diagnosis not present

## 2024-02-01 DIAGNOSIS — Z79899 Other long term (current) drug therapy: Secondary | ICD-10-CM | POA: Diagnosis not present

## 2024-02-01 DIAGNOSIS — M5116 Intervertebral disc disorders with radiculopathy, lumbar region: Secondary | ICD-10-CM | POA: Diagnosis not present

## 2024-02-01 DIAGNOSIS — Z801 Family history of malignant neoplasm of trachea, bronchus and lung: Secondary | ICD-10-CM | POA: Diagnosis not present

## 2024-02-01 DIAGNOSIS — Z8261 Family history of arthritis: Secondary | ICD-10-CM | POA: Diagnosis not present

## 2024-02-01 DIAGNOSIS — Z8249 Family history of ischemic heart disease and other diseases of the circulatory system: Secondary | ICD-10-CM | POA: Diagnosis not present

## 2024-02-01 DIAGNOSIS — Z823 Family history of stroke: Secondary | ICD-10-CM | POA: Diagnosis not present

## 2024-02-01 DIAGNOSIS — Z789 Other specified health status: Secondary | ICD-10-CM

## 2024-02-01 DIAGNOSIS — Z604 Social exclusion and rejection: Secondary | ICD-10-CM | POA: Diagnosis not present

## 2024-02-01 DIAGNOSIS — Z8572 Personal history of non-Hodgkin lymphomas: Secondary | ICD-10-CM | POA: Diagnosis not present

## 2024-02-01 DIAGNOSIS — Z888 Allergy status to other drugs, medicaments and biological substances status: Secondary | ICD-10-CM | POA: Diagnosis not present

## 2024-02-01 DIAGNOSIS — M51369 Other intervertebral disc degeneration, lumbar region without mention of lumbar back pain or lower extremity pain: Secondary | ICD-10-CM | POA: Diagnosis not present

## 2024-02-01 DIAGNOSIS — F32A Depression, unspecified: Secondary | ICD-10-CM | POA: Diagnosis not present

## 2024-02-01 DIAGNOSIS — G4733 Obstructive sleep apnea (adult) (pediatric): Secondary | ICD-10-CM | POA: Diagnosis not present

## 2024-02-01 DIAGNOSIS — Z87442 Personal history of urinary calculi: Secondary | ICD-10-CM | POA: Diagnosis not present

## 2024-02-01 DIAGNOSIS — Z800 Family history of malignant neoplasm of digestive organs: Secondary | ICD-10-CM | POA: Diagnosis not present

## 2024-02-01 DIAGNOSIS — M48061 Spinal stenosis, lumbar region without neurogenic claudication: Secondary | ICD-10-CM | POA: Diagnosis not present

## 2024-02-01 HISTORY — PX: ANTERIOR LAT LUMBAR FUSION: SHX1168

## 2024-02-01 SURGERY — ANTERIOR LATERAL LUMBAR FUSION 1 LEVEL
Anesthesia: General | Site: Spine Lumbar

## 2024-02-01 MED ORDER — FENTANYL CITRATE (PF) 250 MCG/5ML IJ SOLN
INTRAMUSCULAR | Status: DC | PRN
  Administered 2024-02-01 (×2): 100 ug via INTRAVENOUS

## 2024-02-01 MED ORDER — PROPOFOL 10 MG/ML IV BOLUS
INTRAVENOUS | Status: AC
  Filled 2024-02-01: qty 20

## 2024-02-01 MED ORDER — POLYETHYLENE GLYCOL 3350 17 G PO PACK: 17.0000 g | PACK | Freq: Every day | ORAL | Status: DC | PRN

## 2024-02-01 MED ORDER — PHENYLEPHRINE 80 MCG/ML (10ML) SYRINGE FOR IV PUSH (FOR BLOOD PRESSURE SUPPORT)
PREFILLED_SYRINGE | INTRAVENOUS | Status: DC | PRN
  Administered 2024-02-01: 160 ug via INTRAVENOUS

## 2024-02-01 MED ORDER — LISINOPRIL 10 MG PO TABS
10.0000 mg | ORAL_TABLET | Freq: Every day | ORAL | Status: DC
  Administered 2024-02-01 – 2024-02-06 (×6): 10 mg via ORAL
  Filled 2024-02-01 (×6): qty 1

## 2024-02-01 MED ORDER — CEFAZOLIN SODIUM-DEXTROSE 2-4 GM/100ML-% IV SOLN
2.0000 g | INTRAVENOUS | Status: AC
  Administered 2024-02-01: 2 g via INTRAVENOUS

## 2024-02-01 MED ORDER — ONDANSETRON HCL 4 MG/2ML IJ SOLN: 4.0000 mg | Freq: Four times a day (QID) | INTRAMUSCULAR | Status: DC | PRN

## 2024-02-01 MED ORDER — GLYCOPYRROLATE PF 0.2 MG/ML IJ SOSY
PREFILLED_SYRINGE | INTRAMUSCULAR | Status: DC | PRN
  Administered 2024-02-01: .2 mg via INTRAVENOUS

## 2024-02-01 MED ORDER — PROPOFOL 500 MG/50ML IV EMUL
INTRAVENOUS | Status: DC | PRN
  Administered 2024-02-01: 125 ug/kg/min via INTRAVENOUS

## 2024-02-01 MED ORDER — METHOCARBAMOL 1000 MG/10ML IJ SOLN: 500.0000 mg | Freq: Four times a day (QID) | INTRAMUSCULAR | Status: DC | PRN

## 2024-02-01 MED ORDER — TAMSULOSIN HCL 0.4 MG PO CAPS
0.4000 mg | ORAL_CAPSULE | Freq: Every day | ORAL | Status: DC
  Administered 2024-02-01 – 2024-02-06 (×6): 0.4 mg via ORAL
  Filled 2024-02-01 (×6): qty 1

## 2024-02-01 MED ORDER — ORAL CARE MOUTH RINSE: 15.0000 mL | Freq: Once | OROMUCOSAL | Status: AC

## 2024-02-01 MED ORDER — DROPERIDOL 2.5 MG/ML IJ SOLN: 0.6250 mg | Freq: Once | INTRAMUSCULAR | Status: DC | PRN

## 2024-02-01 MED ORDER — CHLORHEXIDINE GLUCONATE 0.12 % MT SOLN: 15.0000 mL | Freq: Once | OROMUCOSAL | Status: AC

## 2024-02-01 MED ORDER — ACETAMINOPHEN 500 MG PO TABS
ORAL_TABLET | ORAL | Status: AC
  Administered 2024-02-01: 1000 mg via ORAL
  Filled 2024-02-01: qty 2

## 2024-02-01 MED ORDER — THROMBIN 20000 UNITS EX SOLR
CUTANEOUS | Status: AC
  Filled 2024-02-01: qty 20000

## 2024-02-01 MED ORDER — STERILE WATER FOR IRRIGATION IR SOLN
Status: DC | PRN
  Administered 2024-02-01: 1000 mL

## 2024-02-01 MED ORDER — BUPIVACAINE-EPINEPHRINE 0.25% -1:200000 IJ SOLN
INTRAMUSCULAR | Status: DC | PRN
  Administered 2024-02-01: 40 mL via INTRAMUSCULAR

## 2024-02-01 MED ORDER — SODIUM CHLORIDE 0.9% FLUSH: 3.0000 mL | INTRAVENOUS | Status: DC | PRN

## 2024-02-01 MED ORDER — LORAZEPAM 1 MG PO TABS
1.0000 mg | ORAL_TABLET | Freq: Three times a day (TID) | ORAL | Status: DC | PRN
  Administered 2024-02-02 – 2024-02-06 (×7): 1 mg via ORAL
  Filled 2024-02-01 (×2): qty 1
  Filled 2024-02-01: qty 2
  Filled 2024-02-01 (×4): qty 1

## 2024-02-01 MED ORDER — HYDROMORPHONE HCL 1 MG/ML IJ SOLN
1.0000 mg | INTRAMUSCULAR | Status: DC | PRN
  Administered 2024-02-01 – 2024-02-02 (×2): 1 mg via INTRAVENOUS
  Filled 2024-02-01 (×2): qty 1

## 2024-02-01 MED ORDER — BUPROPION HCL ER (XL) 300 MG PO TB24: 300.0000 mg | ORAL_TABLET | Freq: Every day | ORAL | Status: DC

## 2024-02-01 MED ORDER — ONDANSETRON HCL 4 MG PO TABS: 4.0000 mg | ORAL_TABLET | Freq: Four times a day (QID) | ORAL | Status: DC | PRN

## 2024-02-01 MED ORDER — OXYCODONE HCL 5 MG PO TABS
5.0000 mg | ORAL_TABLET | ORAL | Status: DC | PRN
  Administered 2024-02-03 – 2024-02-04 (×2): 5 mg via ORAL
  Filled 2024-02-01 (×2): qty 1

## 2024-02-01 MED ORDER — METHOCARBAMOL 500 MG PO TABS
500.0000 mg | ORAL_TABLET | Freq: Four times a day (QID) | ORAL | Status: DC | PRN
  Administered 2024-02-01 – 2024-02-04 (×6): 500 mg via ORAL
  Filled 2024-02-01 (×6): qty 1

## 2024-02-01 MED ORDER — ACETAMINOPHEN 325 MG PO TABS: 650.0000 mg | ORAL_TABLET | ORAL | Status: DC | PRN

## 2024-02-01 MED ORDER — MENTHOL 3 MG MT LOZG: 1.0000 | LOZENGE | OROMUCOSAL | Status: DC | PRN

## 2024-02-01 MED ORDER — SODIUM CHLORIDE 0.9 % IV SOLN: 250.0000 mL | INTRAVENOUS | Status: AC

## 2024-02-01 MED ORDER — THROMBIN 20000 UNITS EX SOLR
CUTANEOUS | Status: DC | PRN
  Administered 2024-02-01: 20 mL via TOPICAL

## 2024-02-01 MED ORDER — CHLORHEXIDINE GLUCONATE 0.12 % MT SOLN
OROMUCOSAL | Status: AC
  Administered 2024-02-01: 15 mL via OROMUCOSAL
  Filled 2024-02-01: qty 15

## 2024-02-01 MED ORDER — OXYCODONE HCL 5 MG PO TABS
ORAL_TABLET | ORAL | Status: AC
  Filled 2024-02-01: qty 1

## 2024-02-01 MED ORDER — ACETAMINOPHEN 650 MG RE SUPP: 650.0000 mg | RECTAL | Status: DC | PRN

## 2024-02-01 MED ORDER — SODIUM CHLORIDE 0.9% FLUSH
3.0000 mL | Freq: Two times a day (BID) | INTRAVENOUS | Status: DC
  Administered 2024-02-01 – 2024-02-06 (×9): 3 mL via INTRAVENOUS

## 2024-02-01 MED ORDER — ACETAMINOPHEN 500 MG PO TABS: 1000.0000 mg | ORAL_TABLET | Freq: Once | ORAL | Status: AC

## 2024-02-01 MED ORDER — LIDOCAINE 2% (20 MG/ML) 5 ML SYRINGE
INTRAMUSCULAR | Status: DC | PRN
  Administered 2024-02-01: 100 mg via INTRAVENOUS

## 2024-02-01 MED ORDER — EPHEDRINE SULFATE-NACL 50-0.9 MG/10ML-% IV SOSY
PREFILLED_SYRINGE | INTRAVENOUS | Status: DC | PRN
  Administered 2024-02-01: 10 mg via INTRAVENOUS
  Administered 2024-02-01: 5 mg via INTRAVENOUS
  Administered 2024-02-01: 10 mg via INTRAVENOUS

## 2024-02-01 MED ORDER — BUPIVACAINE LIPOSOME 1.3 % IJ SUSP
INTRAMUSCULAR | Status: AC
  Filled 2024-02-01: qty 20

## 2024-02-01 MED ORDER — AMLODIPINE BESYLATE 5 MG PO TABS
2.5000 mg | ORAL_TABLET | Freq: Every day | ORAL | Status: DC
  Administered 2024-02-02 – 2024-02-05 (×4): 2.5 mg via ORAL
  Filled 2024-02-01 (×5): qty 1

## 2024-02-01 MED ORDER — FENTANYL CITRATE (PF) 250 MCG/5ML IJ SOLN
INTRAMUSCULAR | Status: AC
  Filled 2024-02-01: qty 5

## 2024-02-01 MED ORDER — PANTOPRAZOLE SODIUM 40 MG PO TBEC
40.0000 mg | DELAYED_RELEASE_TABLET | Freq: Every day | ORAL | Status: DC
  Administered 2024-02-01 – 2024-02-05 (×5): 40 mg via ORAL
  Filled 2024-02-01 (×5): qty 1

## 2024-02-01 MED ORDER — SODIUM CHLORIDE 0.9 % IV SOLN
0.0125 ug/kg/min | INTRAVENOUS | Status: AC
  Administered 2024-02-01: .15 ug/kg/min via INTRAVENOUS
  Filled 2024-02-01: qty 2000

## 2024-02-01 MED ORDER — LACTATED RINGERS IV SOLN: INTRAVENOUS | Status: DC

## 2024-02-01 MED ORDER — OXYCODONE HCL 5 MG/5ML PO SOLN: 5.0000 mg | Freq: Once | ORAL | Status: AC | PRN

## 2024-02-01 MED ORDER — ALLOPURINOL 300 MG PO TABS
150.0000 mg | ORAL_TABLET | Freq: Every day | ORAL | Status: DC
  Administered 2024-02-01 – 2024-02-05 (×4): 150 mg via ORAL
  Filled 2024-02-01 (×5): qty 1

## 2024-02-01 MED ORDER — OXYCODONE HCL 5 MG PO TABS
5.0000 mg | ORAL_TABLET | Freq: Once | ORAL | Status: AC | PRN
  Administered 2024-02-01: 5 mg via ORAL

## 2024-02-01 MED ORDER — BUPIVACAINE-EPINEPHRINE (PF) 0.25% -1:200000 IJ SOLN
INTRAMUSCULAR | Status: AC
  Filled 2024-02-01: qty 30

## 2024-02-01 MED ORDER — HYDROMORPHONE HCL 1 MG/ML IJ SOLN
INTRAMUSCULAR | Status: AC
  Filled 2024-02-01: qty 1

## 2024-02-01 MED ORDER — VASOPRESSIN 20 UNIT/ML IV SOLN
INTRAVENOUS | Status: DC | PRN
  Administered 2024-02-01 (×5): .5 [IU] via INTRAVENOUS

## 2024-02-01 MED ORDER — SERTRALINE HCL 100 MG PO TABS
100.0000 mg | ORAL_TABLET | Freq: Every day | ORAL | Status: DC
  Administered 2024-02-02 – 2024-02-06 (×5): 100 mg via ORAL
  Filled 2024-02-01 (×5): qty 1

## 2024-02-01 MED ORDER — BUPROPION HCL ER (XL) 150 MG PO TB24: 150.0000 mg | ORAL_TABLET | Freq: Every day | ORAL | Status: DC

## 2024-02-01 MED ORDER — PHENYLEPHRINE HCL-NACL 20-0.9 MG/250ML-% IV SOLN
INTRAVENOUS | Status: DC | PRN
  Administered 2024-02-01: 35 ug/min via INTRAVENOUS

## 2024-02-01 MED ORDER — FENTANYL CITRATE (PF) 100 MCG/2ML IJ SOLN
25.0000 ug | INTRAMUSCULAR | Status: DC | PRN
  Administered 2024-02-01 (×3): 50 ug via INTRAVENOUS

## 2024-02-01 MED ORDER — LACTATED RINGERS IV SOLN: INTRAVENOUS | Status: DC | PRN

## 2024-02-01 MED ORDER — PHENOL 1.4 % MT LIQD: 1.0000 | OROMUCOSAL | Status: DC | PRN

## 2024-02-01 MED ORDER — GELATIN ABSORBABLE 100 EX MISC
CUTANEOUS | Status: DC | PRN
  Administered 2024-02-01: 1 via TOPICAL

## 2024-02-01 MED ORDER — CEFAZOLIN SODIUM-DEXTROSE 1-4 GM/50ML-% IV SOLN
1.0000 g | Freq: Three times a day (TID) | INTRAVENOUS | Status: AC
  Administered 2024-02-01 (×2): 1 g via INTRAVENOUS
  Filled 2024-02-01 (×2): qty 50

## 2024-02-01 MED ORDER — TRANEXAMIC ACID-NACL 1000-0.7 MG/100ML-% IV SOLN
INTRAVENOUS | Status: AC
  Filled 2024-02-01: qty 100

## 2024-02-01 MED ORDER — BISOPROLOL FUMARATE 5 MG PO TABS
5.0000 mg | ORAL_TABLET | Freq: Every day | ORAL | Status: DC
  Administered 2024-02-01 – 2024-02-04 (×4): 5 mg via ORAL
  Filled 2024-02-01 (×6): qty 1

## 2024-02-01 MED ORDER — SODIUM CHLORIDE 0.9 % IV SOLN
0.1500 ug/kg/min | INTRAVENOUS | Status: AC
  Administered 2024-02-01: .15 ug/kg/min via INTRAVENOUS
  Filled 2024-02-01: qty 2000

## 2024-02-01 MED ORDER — OXYCODONE HCL 5 MG PO TABS
10.0000 mg | ORAL_TABLET | ORAL | Status: DC | PRN
  Administered 2024-02-01 – 2024-02-04 (×11): 10 mg via ORAL
  Filled 2024-02-01 (×11): qty 2

## 2024-02-01 MED ORDER — HYDROMORPHONE HCL 1 MG/ML IJ SOLN: 0.5000 mg | INTRAMUSCULAR | Status: DC | PRN

## 2024-02-01 MED ORDER — 0.9 % SODIUM CHLORIDE (POUR BTL) OPTIME
TOPICAL | Status: DC | PRN
  Administered 2024-02-01 (×2): 1000 mL

## 2024-02-01 MED ORDER — SUCCINYLCHOLINE CHLORIDE 200 MG/10ML IV SOSY
PREFILLED_SYRINGE | INTRAVENOUS | Status: DC | PRN
  Administered 2024-02-01: 140 mg via INTRAVENOUS

## 2024-02-01 MED ORDER — VASOPRESSIN 20 UNIT/ML IV SOLN
INTRAVENOUS | Status: AC
  Filled 2024-02-01: qty 1

## 2024-02-01 MED ORDER — BUPIVACAINE-EPINEPHRINE 0.25% -1:200000 IJ SOLN
INTRAMUSCULAR | Status: DC | PRN
  Administered 2024-02-01: 10 mL

## 2024-02-01 MED ORDER — TRANEXAMIC ACID-NACL 1000-0.7 MG/100ML-% IV SOLN
1000.0000 mg | INTRAVENOUS | Status: AC
  Administered 2024-02-01: 1000 mg via INTRAVENOUS
  Filled 2024-02-01: qty 100

## 2024-02-01 MED ORDER — FENTANYL CITRATE (PF) 100 MCG/2ML IJ SOLN
INTRAMUSCULAR | Status: AC
  Filled 2024-02-01: qty 2

## 2024-02-01 MED ORDER — ONDANSETRON HCL 4 MG/2ML IJ SOLN
INTRAMUSCULAR | Status: DC | PRN
  Administered 2024-02-01: 4 mg via INTRAVENOUS

## 2024-02-01 MED ORDER — SURGIFLO WITH THROMBIN (HEMOSTATIC MATRIX KIT) OPTIME
TOPICAL | Status: DC | PRN
  Administered 2024-02-01 (×3): 1 via TOPICAL

## 2024-02-01 MED ORDER — BUPROPION HCL ER (XL) 300 MG PO TB24
450.0000 mg | ORAL_TABLET | Freq: Every day | ORAL | Status: DC
  Administered 2024-02-02 – 2024-02-06 (×5): 450 mg via ORAL
  Filled 2024-02-01: qty 3
  Filled 2024-02-01: qty 1
  Filled 2024-02-01 (×3): qty 3

## 2024-02-01 MED ORDER — CEFAZOLIN SODIUM-DEXTROSE 2-4 GM/100ML-% IV SOLN
INTRAVENOUS | Status: AC
  Filled 2024-02-01: qty 100

## 2024-02-01 MED ORDER — MAGNESIUM CITRATE PO SOLN
1.0000 | Freq: Once | ORAL | Status: AC | PRN
  Administered 2024-02-02: 1 via ORAL
  Filled 2024-02-01: qty 296

## 2024-02-01 MED ORDER — PROPOFOL 10 MG/ML IV BOLUS
INTRAVENOUS | Status: DC | PRN
  Administered 2024-02-01: 150 mg via INTRAVENOUS
  Administered 2024-02-01 (×2): 50 mg via INTRAVENOUS

## 2024-02-01 MED ORDER — SENNOSIDES-DOCUSATE SODIUM 8.6-50 MG PO TABS
1.0000 | ORAL_TABLET | Freq: Two times a day (BID) | ORAL | Status: DC
  Administered 2024-02-01 – 2024-02-06 (×8): 1 via ORAL
  Filled 2024-02-01 (×11): qty 1

## 2024-02-01 MED ORDER — ACETAMINOPHEN 10 MG/ML IV SOLN: 1000.0000 mg | Freq: Once | INTRAVENOUS | Status: DC | PRN

## 2024-02-01 SURGICAL SUPPLY — 81 items
ALLOGRFT BNE OSSIFUSE FBR 1CC (Bone Implant) IMPLANT
ALLOGRFT BNE OSSIFUSE FBR 5CC (Bone Implant) IMPLANT
BAG COUNTER SPONGE SURGICOUNT (BAG) ×4 IMPLANT
BLADE CLIPPER SURG (BLADE) IMPLANT
BLADE SURG 10 STRL SS (BLADE) ×2 IMPLANT
BUR EGG ELITE 4.0 (BURR) ×2 IMPLANT
BUR MATCHSTICK NEURO 3.0X3.8 (BURR) ×2 IMPLANT
CABLE BIPOLOR RESECTION CORD (MISCELLANEOUS) ×2 IMPLANT
CATH FOLEY 2WAY SLVR 5CC 16FR (CATHETERS) ×2 IMPLANT
CLIP NEUROVISION LG (NEUROSURGERY SUPPLIES) IMPLANT
CLSR STERI-STRIP ANTIMIC 1/2X4 (GAUZE/BANDAGES/DRESSINGS) ×2 IMPLANT
CNTNR URN SCR LID CUP LEK RST (MISCELLANEOUS) IMPLANT
COVER MAYO STAND STRL (DRAPES) IMPLANT
COVER SURGICAL LIGHT HANDLE (MISCELLANEOUS) ×4 IMPLANT
DRAPE C-ARM 42X72 X-RAY (DRAPES) ×4 IMPLANT
DRAPE C-ARMOR (DRAPES) ×4 IMPLANT
DRAPE INCISE IOBAN 66X45 STRL (DRAPES) IMPLANT
DRAPE SURG 17X23 STRL (DRAPES) ×2 IMPLANT
DRAPE U-SHAPE 47X51 STRL (DRAPES) ×2 IMPLANT
DRSG OPSITE POSTOP 4X6 (GAUZE/BANDAGES/DRESSINGS) ×2 IMPLANT
DRSG OPSITE POSTOP 4X8 (GAUZE/BANDAGES/DRESSINGS) ×4 IMPLANT
DURAPREP 26ML APPLICATOR (WOUND CARE) ×4 IMPLANT
ELECT BLADE 6.5 EXT (BLADE) ×2 IMPLANT
ELECT CAUTERY BLADE 6.4 (BLADE) ×2 IMPLANT
ELECT NVM5 SURFACE MEP/EMG (ELECTRODE) IMPLANT
ELECT PENCIL ROCKER SW 15FT (MISCELLANEOUS) ×4 IMPLANT
ELECTRODE BLDE 4.0 EZ CLN MEGD (MISCELLANEOUS) ×2 IMPLANT
ELECTRODE REM PT RTRN 9FT ADLT (ELECTROSURGICAL) ×4 IMPLANT
FEE NCS SETUP TEAR DOWN NVM5 (MISCELLANEOUS) IMPLANT
GLOVE BIOGEL PI IND STRL 8.5 (GLOVE) ×4 IMPLANT
GLOVE SS BIOGEL STRL SZ 8.5 (GLOVE) ×4 IMPLANT
GOWN STRL REUS W/ TWL LRG LVL3 (GOWN DISPOSABLE) ×4 IMPLANT
GOWN STRL REUS W/ TWL XL LVL3 (GOWN DISPOSABLE) ×4 IMPLANT
GOWN STRL REUS W/TWL 2XL LVL3 (GOWN DISPOSABLE) ×8 IMPLANT
GUIDEWIRE NITINOL BEVEL TIP (WIRE) IMPLANT
KIT BASIN OR (CUSTOM PROCEDURE TRAY) ×4 IMPLANT
KIT DILATOR XLIF 5 (KITS) IMPLANT
KIT POSITIONER JACKSON TABLE (MISCELLANEOUS) ×2 IMPLANT
KIT SURGICAL ACCESS MAXCESS 4 (KITS) IMPLANT
KIT TURNOVER KIT B (KITS) ×4 IMPLANT
MODULE EMG NDL SSEP NVM5 (NEUROSURGERY SUPPLIES) IMPLANT
NDL 22X1.5 STRL (OR ONLY) (MISCELLANEOUS) ×4 IMPLANT
NDL I-PASS III (NEEDLE) IMPLANT
NDL SPNL 18GX3.5 QUINCKE PK (NEEDLE) ×4 IMPLANT
PACK LAMINECTOMY ORTHO (CUSTOM PROCEDURE TRAY) ×4 IMPLANT
PACK UNIVERSAL I (CUSTOM PROCEDURE TRAY) ×4 IMPLANT
PAD ARMBOARD POSITIONER FOAM (MISCELLANEOUS) ×8 IMPLANT
PATTIES SURGICAL .5 X.5 (GAUZE/BANDAGES/DRESSINGS) IMPLANT
PATTIES SURGICAL .5 X1 (DISPOSABLE) ×2 IMPLANT
POSITIONER HEAD PRONE TRACH (MISCELLANEOUS) ×2 IMPLANT
PROBE BALL TIP NVM5 SNG USE (NEUROSURGERY SUPPLIES) IMPLANT
PUTTY DBM INSTAFILL CART 5CC (Putty) IMPLANT
ROD RELINE TI MAS 5.5X70MM LD (Rod) IMPLANT
ROD RELINE TI MAS LD 5.5X65 (Rod) IMPLANT
SCREW LOCK RELINE 5.5 TULIP (Screw) IMPLANT
SCREW RED MAS POLY 7.5X40MM (Screw) IMPLANT
SCREW RED RELINE 7.5X45MM POLY (Screw) IMPLANT
SCREW RELINE RED 6.5X45MM POLY (Screw) IMPLANT
SOLN 0.9% NACL POUR BTL 1000ML (IV SOLUTION) ×2 IMPLANT
SOLN STERILE WATER BTL 1000 ML (IV SOLUTION) ×4 IMPLANT
SPACER RISEL 22X55 7-14MM (Spacer) IMPLANT
SPONGE SURGIFOAM ABS GEL 100 (HEMOSTASIS) ×4 IMPLANT
SPONGE T-LAP 4X18 ~~LOC~~+RFID (SPONGE) ×8 IMPLANT
STAPLER SKIN PROX 35W (STAPLE) ×2 IMPLANT
STRIP CLOSURE SKIN 1/2X4 (GAUZE/BANDAGES/DRESSINGS) IMPLANT
SURGIFLO W/THROMBIN 8M KIT (HEMOSTASIS) IMPLANT
SUT BONE WAX W31G (SUTURE) ×2 IMPLANT
SUT MNCRL AB 3-0 PS2 27 (SUTURE) ×2 IMPLANT
SUT MNCRL+ AB 3-0 CT1 36 (SUTURE) ×4 IMPLANT
SUT PDS AB 1 CTX 36 (SUTURE) ×4 IMPLANT
SUT STRATAFIX 1PDS 45CM VIOLET (SUTURE) IMPLANT
SUT VIC AB 1 CT1 18XCR BRD 8 (SUTURE) ×6 IMPLANT
SUT VIC AB 2-0 CT1 18 (SUTURE) ×6 IMPLANT
SYR BULB IRRIG 60ML STRL (SYRINGE) ×4 IMPLANT
SYR CONTROL 10ML LL (SYRINGE) ×2 IMPLANT
TAPE CLOTH 4X10 WHT NS (GAUZE/BANDAGES/DRESSINGS) ×2 IMPLANT
TIP CONICAL INSTAFILL (ORTHOPEDIC DISPOSABLE SUPPLIES) IMPLANT
TOWEL GREEN STERILE (TOWEL DISPOSABLE) ×4 IMPLANT
TOWEL GREEN STERILE FF (TOWEL DISPOSABLE) ×4 IMPLANT
TRAY FOLEY MTR SLVR 16FR STAT (SET/KITS/TRAYS/PACK) ×2 IMPLANT
YANKAUER SUCT BULB TIP NO VENT (SUCTIONS) ×2 IMPLANT

## 2024-02-01 NOTE — H&P (Signed)
 History: The patient is a 73 year old male who presents for pre-operative visit in preparation for their XLIF L3-4, PSFI L3-5, L2-3 decompression, which is scheduled on 02/01/24 with Dr. Donaciano Sprang, MD at [facility]. The patient has had symptoms in the Back including pain which has impacted their quality of life and ability to do activities of daily living. The patient currently has a diagnosis of lumbar spinal stenosis and has failed conservative treatments including activity modification. The patient has had previous lumbar fusion of L4-L5 . The patient denies an active infection.  Past Medical History:  Diagnosis Date   Abnormal ECG 09/19/2017   NSR. Left axis deviation.    Alcohol abuse    Anxiety    Cutaneous T-cell lymphoma (HCC)    Depression    ED (erectile dysfunction)    GERD (gastroesophageal reflux disease)    History of kidney stones    Hypertension    Moderate tetrahydrocannabinol (THC) dependence (HCC)    Neuropathy    arms and legs   Photodermatitis    Sleep apnea    uses CPAP   Spinal stenosis    Urge incontinence     Allergies  Allergen Reactions   Nsaids Other (See Comments)    GI Bleeding   Aspirin     Other Reaction(s): Not available   Cortisone     Pt stated, I am only allergic to cortisone injections  Pt states he thought he had anaphylactic reaction to cortisone injection, but he has had cortisone injections since    No current facility-administered medications on file prior to encounter.   Current Outpatient Medications on File Prior to Encounter  Medication Sig Dispense Refill   allopurinol  (ZYLOPRIM ) 300 MG tablet Take 0.5 tablets (150 mg total) by mouth daily. 45 tablet 1   amLODipine  (NORVASC ) 2.5 MG tablet Take 2.5 mg by mouth daily.     ammonium lactate (LAC-HYDRIN) 12 % lotion Apply 1 Application topically as needed for dry skin.     buPROPion  (WELLBUTRIN  XL) 150 MG 24 hr tablet Take 1 tablet (150 mg total) by mouth  daily. Take with 300 mg tablet for 450 mg total daily 90 tablet 0   buPROPion  (WELLBUTRIN  XL) 300 MG 24 hr tablet TAKE 1 TABLET (300 MG TOTAL) BY MOUTH DAILY. TAKING WITH WELLBUTRIN  SR 150MG  FOR 450MG  TOTAL DAILY. 90 tablet 0   celecoxib  (CELEBREX ) 50 MG capsule Take 1 capsule (50 mg total) by mouth 2 (two) times daily as needed for pain. 60 capsule 3   cetirizine (ZYRTEC) 10 MG tablet Take 10 mg by mouth daily.     clobetasol cream (TEMOVATE) 0.05 % Apply 1 Application topically 2 (two) times daily as needed (Dermatitis).     Cyanocobalamin (B-12 PO) Take 1 tablet by mouth daily.     lisinopril  (ZESTRIL ) 10 MG tablet Take 10 mg by mouth daily.     LORazepam  (ATIVAN ) 1 MG tablet Take 1 tablet (1 mg total) by mouth every 8 (eight) hours as needed for anxiety or sleep. 30 tablet 0   Multiple Vitamin (MULTIVITAMIN) capsule Take 1 capsule by mouth daily.     Nutritional Supplements (SALMON OIL PO) Take 1 Dose by mouth daily.     pantoprazole  (PROTONIX ) 40 MG tablet Take 1 tablet (40 mg total) by mouth daily. 90 tablet 3   Probiotic Product (PROBIOTIC PO) Take 1 tablet by mouth daily.     sertraline  (ZOLOFT ) 100 MG tablet Take 1 tablet (  100 mg total) by mouth daily. 90 tablet 2   tamsulosin  (FLOMAX ) 0.4 MG CAPS capsule TAKE 1 CAPSULE BY MOUTH EVERY DAY 90 capsule 1   traZODone  (DESYREL ) 100 MG tablet Take 1-2 tablets (100-200 mg total) by mouth at bedtime as needed. (Patient taking differently: Take 200 mg by mouth at bedtime.) 180 tablet 2    Physical Exam: Vitals:   02/01/24 0639  BP: 133/80  Pulse: 69  Resp: 20  Temp: 97.6 F (36.4 C)  SpO2: 97%   Body mass index is 30.41 kg/m. Clinical exam: Francisco Cowan is a pleasant individual, who appears younger than their stated age.  He is alert and orientated 3.  No shortness of breath, chest pain.  Abdomen is soft and non-tender, negative loss of bowel and bladder control, no rebound tenderness.  Negative: skin lesions abrasions  contusions  Peripheral pulses: 2+ peripheral pulses bilaterally. LE compartments are: Soft and nontender.  Gait pattern: Altered gait pattern. Unable to heel toe ambulate without loss of balance.  Assistive devices: Walking stick/cane  Neuro: 5/5 motor strength when tested in the seated position. Negative Babinski test. No clonus. Negative nerve root tension signs. Intermittent dysesthesias and neuropathic pain predominantly in the right lower extremity. Symmetrical 2+ deep tendon reflexes bilaterally.  Musculoskeletal: Significant low back pain with palpation and range of motion. Pain radiates from the midline out to the paraspinal region. No hip, knee, ankle pain with isolated joint range of motion.  X-rays of the lumbar spine demonstrate a solid L 4 5 fusion with no hardware complications. Patient has adjacent segment degenerative disc disease with foraminal stenosis at L3-4.  Lumbar MRI: completed on 09/24/2023. Prior TLIF L4-5 with mild central and mild lateral recess and foraminal stenosis. No significant neural compression is noted. No significant central or foraminal stenosis at L5-S1. Severe right foraminal stenosis at L3-4 with asymmetrical collapse of the disc. Moderate to severe right foraminal stenosis at L2-3 with encroachment of the exiting L2 nerve root. Mild to moderate foraminal stenosis at L1/2. Mild levoconvex curvature of the lumbar spine apex at around L4-3 and L3-4. [place]rop's changes most noted at L3-4.  A/P:  Patient has failed conservative management, therefore we have elected to proceed with the following surgical procedure XLIF L3-L4 with PSFI of L3-L5 with L2-L3 decompression on 01/29/2024 at [facility] by Dr. Burnetta.  Francisco Cowan continues to suffer from ongoing back buttock and radicular right leg pain. Pain is in the right L3 dermatome but is most likely due to the adjacent segment degenerative disease. Despite appropriate conservative management we will his  quality of life is continue to deteriorate. At this point I believe the degenerative disc disease with an resulting foraminal stenosis at L3-4 and the foraminal stenosis at L2-3 is the source of his pain and recurrent lower extremity weakness.  Based on clinical exam findings and imaging studies we do believe that surgery is warranted at this time in order to decrease pain and loss of quality of life.  The surgical intervention that we would recommend would be XLIF L3-L4 with PSFI L3-L5 with L2-L3 decompression  The risks of surgery were discussed today with the patient, below are the surgical risks we did discuss.   OLIF/XLIF risks, benefits of surgery were reviewed with the patient. These include: infection, bleeding, death, stroke, paralysis, ongoing or worse pain, need for additional surgery, injury to the lumbar plexus resulting in hip flexor weakness and difficulty walking without assistive devices. Adjacent segment degenerative disease, need for additional  surgery including fusing other levels, leak of spinal fluid, Nonunion, hardware failure, breakage, or mal-position. Deep venous thrombosis (DVT) requiring additional treatment such as filter, and/or medications. Injury to abdominal contents, loss in bowel and bladder control.   Risks and benefits of lumbar decompression/discectomy: Infection, bleeding, death, stroke, paralysis, ongoing or worse pain, need for additional surgery, leak of spinal fluid, adjacent segment degeneration requiring additional surgery, post-operative hematoma formation that can result in neurological compromise and the need for urgent/emergent re-operation. Loss in bowel and bladder control. Injury to major vessels that could result in the need for urgent abdominal surgery to stop bleeding. Risk of deep venous thrombosis (DVT) and the need for additional treatment. Recurrent disc herniation resulting in the need for revision surgery, which could include fusion surgery  (utilizing instrumentation such as pedicle screws and intervertebral cages).    Risks and benefits of posterior spinal fusion: Infection, bleeding, death, stroke, paralysis, ongoing or worse pain, need for additional surgery, nonunion, leak of spinal fluid, adjacent segment degeneration requiring additional fusion surgery, Injury to abdominal vessels that can require anterior surgery to stop bleeding. Malposition of the cage and/or pedicle screws that could require additional surgery. Loss of bowel and bladder control. Postoperative hematoma causing neurologic compression that could require urgent or emergent re-operation.  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 Instructed patient on which medications to discontinue 5 days prior to surgery.

## 2024-02-01 NOTE — Brief Op Note (Signed)
 11/02/2023   10:23 AM   PATIENT:  02/01/2024  1:08 PM  PATIENT:  Curtistine JONETTA Matin  73 y.o. 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  PROCEDURE:  Procedure(s) with comments: EXTTEME LATERAL INTERBODY FUSION OF LUMBAR THREE TO LUMBAR FOUR WITH POSTERIOR SUPPLEMENTAL FUSION INSTRUMENTED OF LUMBAR THREE TO LUMBAR FOUR AND LUMBAR FOUR AND LUMBAR FIVE WITH LUMBAR DECOMPRESSION AT LUMBAR TWO TO LUMBAR THREE (N/A) - Extreme lateral interbody fusion  L3-4, posterior spinal fusion  L3-5, DECOMPRESSION L2-3 POSTERIOR LUMBAR FUSION 2 LEVEL (N/A)  SURGEON:  Surgeons and Role:    DEWAINE Burnetta Aures, MD - Primary  PHYSICIAN ASSISTANT:  Jeoffrey Sages, PA  ANESTHESIA:   general  EBL:  150 mL   BLOOD ADMINISTERED:none  DRAINS: none   LOCAL MEDICATIONS USED:  MARCAINE     and OTHER exparel   SPECIMEN:  No Specimen  DISPOSITION OF SPECIMEN:  N/A  COUNTS:  YES  TOURNIQUET:  * No tourniquets in log *  DICTATION: .Dragon Dictation  PLAN OF CARE: Admit to inpatient   PATIENT DISPOSITION:  PACU - hemodynamically stable.

## 2024-02-01 NOTE — Op Note (Signed)
 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  Right foraminal spinal stenosis nerve root compression L2-3.  Prior TLIF L4-5.  POST-OPERATIVE DIAGNOSIS: Same  PROCEDURE:   1.  XLIF L3-4. 2.  Posterior spinal instrumentation L3-L5 3.  Right L2-3 foraminotomy/decompression  SURGEON:  Donaciano Sprang, MD  PHYSICIAN ASSISTANT: Jeoffrey Sages, PA  ANESTHESIA:   General  EBL: 150 ml   Complications: None  Neuromonitoring: No adverse free running EMG, SSEP or evoked motor potential throughout the entire case.  All pedicle screws were directly stimulated and there was no activity at greater than 40 mA.  Implants: 1.  Globus expandable intervertebral cage.  7 x 22 x 55 parallel.  Expanded to final height of 12.25 mm. 2.  NuVasive MIS pedicle screws.  L3: 6.5 x 45 mm length.  Right side: L4 and L5: Removed 6.5 x 40 mm length pedicle screws.  Reinserted 7.5 x 40 mm length screws.  Left side: Removed and temporarily replaced the left L4 pedicle screw with a 7.5 x 45 mm length screw.  However this was removed as it was difficult to pass the rod.  L5: Removed 6.5 x 45 mm length screw and replaced with 7.5 x 45 mm length screw.  Graft: ossifuse  BRIEF HISTORY: Francisco Cowan is a 73 y.o. male who had a previous TLIF several years ago.  The patient has now developed back buttock and neuropathic leg pain right side worse than the left.  Imaging demonstrated adjacent segment degenerative disease at L3-4 with retrolisthesis contributing to this central canal and foraminal stenosis.  At L2-3 on the right side there was severe right foraminal stenosis affecting the exiting L2 nerve root.  Attempts at conservative management had failed to alleviate his pain or improve his quality of life.  As a result we elected  to move forward with surgery.  All appropriate risks, benefits, alternatives were discussed with the patient and consent was obtained.  PROCEDURE DETAILS: Patient was brought into the operating room and was properly positioned on the operating room table.  After induction with general anesthesia the patient was endotracheally intubated.  A timeout was taken to confirm all important data: including patient, procedure, and the level. Teds, SCD's were applied.   Foley was placed by the nurse, and the neuromonitoring representative placed all appropriate needles for intraoperative SSEP, evoked motor and EMG monitoring.  The patient was then turned into the lateral decubitus position left side up.  Axillary roll was placed and the knee and ankle were padded.  Using fluoroscopy I confirmed satisfactory positioning of the patient so the L3-4 level was parallel to the floor.  The patient was secured to the table with tape.  The left flank was then prepped and draped in a standard fashion.  Using fluoroscopy I marked out the anterior and posterior margins of the L3-4 disc space and infiltrated the incision with quarter percent Marcaine  with epinephrine .  Lateral incision was made and I sharply dissected out to the fascia of the external oblique muscle.  I then bluntly dissected through the internal and external oblique muscles and entered bluntly into the retroperitoneal space.  I bluntly dissected and mobilized the soft tissue until I could palpate the surface of the psoas muscle.  I then placed my first dilator onto the surface of the psoas over the  L3-4 disc space.  I stimulated the psoas to ensure that I was not traumatizing the plexus and then advance my dilator through the psoas down to the lateral disc space.  I then secured it in place with a guidepin.  I then circumferentially stimulated to ensure the plexus was not being traumatized.  I sequentially dilated and stimulated until my final dilator was in place.   I then placed a working retractor and secured it to the bed with the strong arm.  I then removed the dilators and then using fluoroscopy I moved my retractor posteriorly until I was in the posterior third of the disc space.  This provided an excellent cuff of psoas muscle to protect the plexus.  I then stimulated behind each of the retractor blades to ensure that I was not traumatizing the plexus.  I then secured the posterior blade to the disc space with the posterior shim.  I then advanced the lateral blades forward until I had visualization of the entire lateral aspect of the L3-4 disc space.  Annulotomy was then performed with a 10 blade scalpel and using a combination of Cobb elevators, box osteotome, and pituitary rongeurs I removed all of the disc material.  Then using curettes I removed bone spurs from the contralateral side and removed all of the remaining cartilaginous material from the the disc space.  At this point once I had removed all of the disc material I then placed my trialing device.  Once the expandable trial was in place I expanded it to a final height of 11 mm.  I noted excellent parallel distraction of the disc space with improved foraminal space.  I was quite satisfied with the overall indirect decompression.  I then removed the expandable retractor and irrigated wound copiously normal saline.  I rasped the endplates to ensure had bleeding subchondral bone.  I then packed the expandable cage with the allograft and then gently inserted into the disc space.  I used fluoroscopy to confirm its position.  Once it was properly positioned I then expanded to a final height of 12.25 mm.  I had excellent overall restoration of the disc space and improvement in the foraminal space.  The cage was secure.  I then removed the inserting device and backfilled the cage with the allograft.  I then irrigated the wound copiously normal saline and removed the posterior shim and then used bipolar cautery to  obtain hemostasis.  I then placed some Floseal to aid in the hemostasis and removed the retractor.  I then closed the fascia of the external oblique with interrupted #1 Vicryl sutures followed by an inner layer of interrupted 2-0 Vicryl sutures and then was closed with 3-0 Monocryl.  Prior to closure I did take final AP and lateral fluoroscopy views and confirmed that there was no retained surgical instruments in the field.  Once the wound was closed I have placed Steri-Strips and a dry dressing.  The drapes were then taken down and he was shifted back into the supine position on the operative table.  The flat Leonce table was then brought into the surgical suite with a Wilson frame.  The patient was then rotated off of the pro axis table onto the flat Anna table.  Once the patient was turned prone onto the Wilson frame the pro axis table was removed from the surgical suite.  All bony prominences were well-padded and now the back was prepped and draped in a standard fashion.  Another timeout  was taken confirming patient procedure and all other pertinent important data.  The previous L4-5 incisions were marked and infiltrated with quarter percent Marcaine  with Exparel  and epinephrine .  I then used AP fluoroscopy to mark out the lateral border of the L2 and L3 pedicles.  I marked out the lateral aspect of the L2 pars as well.  I then marked my incision for the L2 foraminotomy.  I infiltrated this incision with quarter percent Marcaine  with epinephrine .  I made my incision and sharply dissected down to the deep fascia.  I incised the deep fascia and bluntly dissected through the paraspinal muscles until I came down to the L2 pars.  I could palpate the L2/3 and L1/2 facet complexes as well as the L2 pars.  I placed my retractor and expanded the retractor to clearly visualize the L2 pars.  I placed a Penfield 4 on the lateral side of the pars into the L2 foramen and took AP and lateral x-rays confirming that I  was at the appropriate level.  Using a 2 and 3 mm Kerrison rongeur I performed a lateral foraminotomy.  There was taken not to sacrifice an excessive amount of the pars as it would lead to weakening and possible fracture.  I then used a fine nerve hook to dissect through the facet capsule/ligamentum flavum until I could create a plane beneath it.  I then used my Kerrison rongeur to remove this capsule and ligamentum flavum.  I then placed a neuro patty medially to create a plane between the thecal sac and the ligamentum flavum.  I then used my Kerrison rongeur to gently resect this.  I then proceeded inferiorly and superiorly.  Once I had removed all of the facet capsule and the lateral portion of the ligamentum flavum I could clearly visualize the lateral aspect of the thecal sac.  My nerve hook could freely pass underneath the thecal sac medially and inferiorly into the L3 foramen.  I could also now visualize the L2 nerve root and it was no longer under compression from the foraminal stenosis.  Once the decompression/foraminotomy proved satisfactory I moved forward with my instrumentation.  Using fluoroscopy I identified the lateral border of the L3 pedicle and advanced my Jamshidi needle percutaneously down to this level. I then attached the neuromonitoring device to the Jamshidi needle and using fluoroscopy and direct neural stimulation I advanced the Jamshidi needle into the L3 pedicle.  As I neared the deal wall I confirmed on the lateral view that I was just beyond the posterior wall of the vertebral body.  Once I confirm my trajectory and position I advanced into the vertebral body.  I then placed a guidepin to cannulate the L3 pedicle.  Using the exact same technique I placed the L3 Jamshidi needle on the contralateral side and cannulated this pedicle.  I then reincised the incision over the L4-5 level on the right side.  I sharply dissected down to the deep fascia.  I incised the deep fascia and bluntly  dissected down until I could palpate the L4-5 pedicle screw construct.  I removed the scar tissue and expose the hardware.  I then removed the locking, and the rod.  I then removed the pedicle screws and palpated the hole with a ball-tipped feeler.  Once I confirmed it was intact I then placed my 7.5 mm tap and recapped the 2 screw holes.  I then repalpated the hole with a ball-tipped feeler and placed the 7.5 x 40 mm  length screws.  Both screws had excellent purchase.  I then measured and placed a 6.5 mm diameter by 45 mm length screw at L3.  The screw was advanced over the guidepin and into the L3 pedicle once the right L3, L4, and L5 pedicle screws were placed I directly stimulated each of the screws.  There was no adverse activity at greater than 40 mA.  AP and lateral imaging demonstrate satisfactory positioning of the screws.  I then measured and placed a 70 mm length rod.  The polyaxial heads have lined up appropriately to allow the rod to easily pass from L3 down to L5.  With the rod in place I then placed the locking caps and secured the rod.  Once the locking caps were properly seated I then used the torque device the final tightened each of the locking caps.  I removed the inserting device for the rod and then removed the insertion tabs.  I then went to the contralateral side and made an incision through the prior incision over L4-5 and expose the L4-5 hardware in the same fashion that I had done on the right side.  I removed the locking caps, rod, and pedicle screws.  I palpated the pedicle screw holes and then used my 7.5 tap to retapped the holes.  I then placed the 7.5 x 45 mm length screws.  I then placed the 6.5 x 45 mm lag screw over the guidepin into L3.  I then directly stimulated all 3 screws and there was no activity greater than 40 mA.  I then passed a 65 mm length rod from L3 down to L5.  Unfortunately the L4 polyaxial head did not properly align and was causing the overall construct to  deviate as a result of the poor alignment of the screw.  I elected to remove the L4 screw.  I did not feel as though the absence of this 1 pedicle screw would compromise the overall stability of the construct.  I removed the L4 pedicle screw and the rod could now easily pass from L3 down to L5.  I then inserted the locking caps and final tightened them.  I then removed the insertion tabs.  I took my final images and they demonstrated satisfactory positioning of the implants.  I then took x-rays demonstrating the decompression was also satisfactory at L2.  At this point all the wounds were copiously irrigated with normal saline and I infiltrated the paraspinal muscles with quarter percent Marcaine  with epinephrine  and Exparel  for postoperative analgesia.  I then closed each of the wounds in a layered fashion with interrupted #1 Vicryl sutures for the deep fascia, then an interrupted 2-0 Vicryl sutures followed by 3-0 Monocryl for the skin.  Steri-Strips dry dressings were applied and the patient was ultimately extubated and transferred the PACU without incident.  End of the case all needle sponge counts were correct.  There were no adverse intraoperative events.  Donaciano Sprang, MD 02/01/2024 12:38 PM

## 2024-02-01 NOTE — Transfer of Care (Signed)
 Immediate Anesthesia Transfer of Care Note  Patient: BARNES FLOREK  Procedure(s) Performed: EXTTEME LATERAL INTERBODY FUSION OF LUMBAR THREE TO LUMBAR FOUR WITH POSTERIOR SUPPLEMENTAL FUSION INSTRUMENTED OF LUMBAR THREE TO LUMBAR FOUR AND LUMBAR FOUR AND LUMBAR FIVE WITH LUMBAR DECOMPRESSION AT LUMBAR TWO TO LUMBAR THREE (Spine Lumbar) POSTERIOR LUMBAR FUSION 2 LEVEL  Patient Location: PACU  Anesthesia Type:General  Level of Consciousness: drowsy and patient cooperative  Airway & Oxygen Therapy: Patient Spontanous Breathing  Post-op Assessment: Report given to RN and Post -op Vital signs reviewed and stable  Post vital signs: Reviewed and stable  Last Vitals:  Vitals Value Taken Time  BP 97/61 02/01/24 13:09  Temp    Pulse 64 02/01/24 13:11  Resp 15 02/01/24 13:11  SpO2 95 % 02/01/24 13:11  Vitals shown include unfiled device data.  Last Pain:  Vitals:   02/01/24 0702  TempSrc:   PainSc: 0-No pain         Complications: No notable events documented.

## 2024-02-01 NOTE — Anesthesia Postprocedure Evaluation (Signed)
 Anesthesia Post Note  Patient: KAMAR CALLENDER  Procedure(s) Performed: EXTTEME LATERAL INTERBODY FUSION OF LUMBAR THREE TO LUMBAR FOUR WITH POSTERIOR SUPPLEMENTAL FUSION INSTRUMENTED OF LUMBAR THREE TO LUMBAR FOUR AND LUMBAR FOUR AND LUMBAR FIVE WITH LUMBAR DECOMPRESSION AT LUMBAR TWO TO LUMBAR THREE (Spine Lumbar) POSTERIOR LUMBAR FUSION 2 LEVEL     Patient location during evaluation: PACU Anesthesia Type: General Level of consciousness: awake and alert Pain management: pain level controlled Vital Signs Assessment: post-procedure vital signs reviewed and stable Respiratory status: spontaneous breathing, nonlabored ventilation, respiratory function stable and patient connected to nasal cannula oxygen Cardiovascular status: blood pressure returned to baseline and stable Postop Assessment: no apparent nausea or vomiting Anesthetic complications: no   No notable events documented.  Last Vitals:  Vitals:   02/01/24 1400 02/01/24 1415  BP: 120/64 117/67  Pulse: 84 65  Resp: 13 15  Temp:    SpO2: 93% 94%    Last Pain:  Vitals:   02/01/24 1309  TempSrc:   PainSc: Asleep                 Rome Ade

## 2024-02-01 NOTE — Anesthesia Procedure Notes (Signed)
 Procedure Name: Intubation Date/Time: 02/01/2024 7:49 AM  Performed by: Marva Lonni PARAS, CRNAPre-anesthesia Checklist: Patient identified, Emergency Drugs available, Suction available and Patient being monitored Patient Re-evaluated:Patient Re-evaluated prior to induction Oxygen Delivery Method: Circle System Utilized Preoxygenation: Pre-oxygenation with 100% oxygen Induction Type: IV induction Ventilation: Mask ventilation without difficulty Laryngoscope Size: Mac and 4 Grade View: Grade II Tube type: Oral Tube size: 7.5 mm Number of attempts: 1 Airway Equipment and Method: Stylet Placement Confirmation: ETT inserted through vocal cords under direct vision, positive ETCO2 and breath sounds checked- equal and bilateral Secured at: 23 cm Tube secured with: Tape Dental Injury: Teeth and Oropharynx as per pre-operative assessment

## 2024-02-02 ENCOUNTER — Encounter (HOSPITAL_COMMUNITY): Payer: Self-pay | Admitting: Orthopedic Surgery

## 2024-02-02 NOTE — TOC Transition Note (Signed)
 Transition of Care [facility]) - Discharge Note   Patient Details  Name: Francisco Cowan MRN: [id number] Date of Birth: 1951-01-19  [facility] CM/SW Contact:  Andrez JULIANNA George, RN Phone Number: 02/02/2024, 12:16 PM   Clinical Narrative:     Pt will discharge home with home health services through [facility]. Information on the AVS. [facility] will contact him for the first home visit. Pt has transportation home.  Final next level of care: Home w Home Health Services Barriers to Discharge: No Barriers Identified   Patient Goals and CMS Choice   CMS Medicare.gov Compare Post Acute Care list provided to:: Patient Choice offered to / list presented to : Patient      Discharge Placement                       Discharge Plan and Services Additional resources added to the After Visit Summary for                            [facility] Arranged: PT, OT [facility] Agency: [facility] Date [facility] Agency Contacted: 02/02/24   Representative spoke with at [facility] Agency: Adriana  Social Drivers of Health (SDOH) Interventions SDOH Screenings   Food Insecurity: No Food Insecurity (12/18/2023)  Housing: Low Risk  (12/18/2023)  Transportation Needs: No Transportation Needs (12/18/2023)   Received from [facility]  Utilities: Low Risk (12/18/2023)   Received from [facility]  Alcohol Screen: Low Risk  (12/18/2023)  Depression (PHQ2-9): Low Risk  (12/19/2023)  Financial Resource Strain: Low Risk (12/18/2023)   Received from [facility]  Physical Activity: Insufficiently Active (12/18/2023)  Social Connections: Socially Isolated (12/18/2023)  Stress: No Stress Concern Present (12/18/2023)  Tobacco Use: Medium Risk (02/01/2024)  Health Literacy: Low Risk (12/18/2023)   Received from [facility]     Readmission Risk Interventions     No data to display

## 2024-02-02 NOTE — Progress Notes (Signed)
 Patient transfer to 5N per order.

## 2024-02-02 NOTE — Evaluation (Signed)
 Physical Therapy Evaluation Patient Details Name: Francisco Cowan MRN: [id number] DOB: 10-Sep-1950 Today's Date: 02/02/2024  History of Present Illness  73 y.o. 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 Pt s/p XLIF L3-L4 with L2-L3 decompression and extension of PSFI L3-L5 12/4. PMHx: ETOH abuse, anxiety, Tcell lymphoma, HTN, neuropathy, GERD, and OSA on CPAP.   Clinical Impression  Pt admitted with above diagnosis. PTA, pt was independent with functional mobility, ADLs, and IADLs. He lives with his wife in a one story house with a ramped entrance. Pt currently with functional limitations due to the deficits listed below (see PT Problem List). He required CGA for bed mobility, CGA for transfers using RW, and CGA for gait usign RW. Pt is currently limited by decreased cognition, poor safety awareness, back precautions, impaired balance, and decreased activity tolerance. He required max multi-modal cues to maintain back precautions during mobility. Pt will benefit from acute skilled PT to increase his independence and safety with mobility to allow discharge. Recommend HHPT to increase strength, improve balance, decrease fall risk, advance activity tolerance, and optimize safety within the home environment.      If plan is discharge home, recommend the following: A little help with walking and/or transfers;A little help with bathing/dressing/bathroom;Assistance with cooking/housework;Assist for transportation;Help with stairs or ramp for entrance   Can travel by private vehicle        Equipment Recommendations None recommended by PT (Pt reports he has necessary DME)  Recommendations for Other Services       Functional Status Assessment Patient has had a recent decline in their functional status and demonstrates the ability to make significant improvements in function in a reasonable and predictable amount of time.      Precautions / Restrictions Precautions Precautions: Fall;Back Precaution Booklet Issued: Yes (comment) Recall of Precautions/Restrictions: Impaired Precaution/Restrictions Comments: Educated pt on back precautions. He was able to verbalize 3/3 with cues by end of session. Pt required max cues to abide by back precautions throughout mobility. Required Braces or Orthoses: Spinal Brace Spinal Brace: Lumbar corset Restrictions Weight Bearing Restrictions Per Provider Order: No      Mobility  Bed Mobility Overal bed mobility: Needs Assistance Bed Mobility: Rolling, Sidelying to Sit Rolling: Contact guard assist, Used rails Sidelying to sit: Contact guard assist, HOB elevated, Used rails       General bed mobility comments: Educated pt on log roll technique. Step by step ceus for sequencing. Assist to come into sidelying and elevate trunk.    Transfers Overall transfer level: Needs assistance Equipment used: Rolling walker (2 wheels) Transfers: Sit to/from Stand Sit to Stand: Contact guard assist           General transfer comment: Pt stood from lowest bed height. Cued proper hand placement using RW. Pt frequently attempting to maintain BUE support on RW. Powered up with CGA. Fair eccentric control. Transferred into bathroom, backing up d/t room sizing constraints. Emphasis to maintain upright posture and to avoid bending. Pt reached back to grab bar to aid in lowering down. Left pt on toilet with RN present in room.    Ambulation/Gait Ambulation/Gait assistance: Contact guard assist Gait Distance (Feet): 80 Feet Assistive device: Rolling walker (2 wheels) Gait Pattern/deviations: Step-to pattern, Decreased step length - right, Decreased step length - left, Decreased stride length, Trunk flexed Gait velocity: decreased Gait velocity interpretation: <1.31 ft/sec, indicative of household ambulator   General Gait Details: Pt took short slow steps  with heavy reliance on BUE  support. Cued closer proximity to RW and VC/TC to improve upright posture. Pt took frequent standing rest breaks. Cues for PLB technique.  Stairs            Wheelchair Mobility     Tilt Bed    Modified Rankin (Stroke Patients Only)       Balance Overall balance assessment: Needs assistance Sitting-balance support: Bilateral upper extremity supported, Feet supported Sitting balance-Leahy Scale: Fair     Standing balance support: Bilateral upper extremity supported, During functional activity, Reliant on assistive device for balance Standing balance-Leahy Scale: Poor Standing balance comment: Reliant on RW                             Pertinent Vitals/Pain Pain Assessment Pain Assessment: 0-10 Pain Score: 7  Pain Location: Back Pain Descriptors / Indicators: Grimacing, Discomfort, Guarding Pain Intervention(s): Monitored during session, Limited activity within patient's tolerance, Repositioned    Home Living Family/patient expects to be discharged to:: Private residence Living Arrangements: Spouse/significant other Available Help at Discharge: Family;Available 24 hours/day Type of Home: House Home Access: Ramped entrance       Home Layout: One level Home Equipment: Shower seat;Hand held shower head;Adaptive equipment;Grab bars - toilet;Grab bars - tub/shower;Rolling Walker (2 wheels) Additional Comments: Pt poor historian, often responding only responding yes to questions. Most of home set up from Prior admission    Prior Function Prior Level of Function : Independent/Modified Independent;Patient poor historian/Family not available             Mobility Comments: Ind, reports no AD ADLs Comments: Ind     Extremity/Trunk Assessment   Upper Extremity Assessment Upper Extremity Assessment: Defer to OT evaluation    Lower Extremity Assessment Lower Extremity Assessment: Generalized weakness    Cervical / Trunk Assessment Cervical / Trunk  Assessment: Back Surgery  Communication   Communication Communication: Impaired Factors Affecting Communication: Hearing impaired    Cognition Arousal: Alert Behavior During Therapy: Restless, Impulsive   PT - Cognitive impairments: No family/caregiver present to determine baseline, Safety/Judgement, Sequencing, Problem solving                       PT - Cognition Comments: Pt A,Ox4. He has delayed processing and demonstrated difficulty following back precautions despite multi-modal cues. Following commands: Intact       Cueing Cueing Techniques: Verbal cues, Tactile cues, Gestural cues     General Comments General comments (skin integrity, edema, etc.): VSS on RA. Donned LSO while sitting EOB.    Exercises     Assessment/Plan    PT Assessment Patient needs continued PT services  PT Problem List Decreased strength;Decreased range of motion;Decreased activity tolerance;Decreased balance;Decreased mobility;Decreased coordination;Decreased cognition;Decreased knowledge of use of DME;Decreased safety awareness;Decreased knowledge of precautions;Pain       PT Treatment Interventions DME instruction;Gait training;Functional mobility training;Therapeutic activities;Therapeutic exercise;Balance training;Cognitive remediation;Patient/family education    PT Goals (Current goals can be found in the Care Plan section)  Acute Rehab PT Goals Patient Stated Goal: Have less pain PT Goal Formulation: With patient Time For Goal Achievement: 02/16/24 Potential to Achieve Goals: Fair    Frequency Min 5X/week     Co-evaluation               AM-PAC PT 6 Clicks Mobility  Outcome Measure Help needed turning from your back to your side while in a flat bed without  using bedrails?: A Little Help needed moving from lying on your back to sitting on the side of a flat bed without using bedrails?: A Little Help needed moving to and from a bed to a chair (including a  wheelchair)?: A Little Help needed standing up from a chair using your arms (e.g., wheelchair or bedside chair)?: A Little Help needed to walk in hospital room?: A Little Help needed climbing 3-5 steps with a railing? : A Lot 6 Click Score: 17    End of Session Equipment Utilized During Treatment: Gait belt;Back brace Activity Tolerance: Patient tolerated treatment well;Patient limited by pain Patient left: Other (comment);with nursing/sitter in room (in bathroom) Nurse Communication: Mobility status PT Visit Diagnosis: Difficulty in walking, not elsewhere classified (R26.2);Muscle weakness (generalized) (M62.81);Other abnormalities of gait and mobility (R26.89);Unsteadiness on feet (R26.81);Pain Pain - part of body:  (Back)    Time: 9178-9163 PT Time Calculation (min) (ACUTE ONLY): 15 min   Charges:   PT Evaluation $PT Eval Moderate Complexity: 1 Mod   PT General Charges $$ ACUTE PT VISIT: 1 Visit         Randall SAUNDERS, PT, DPT Acute Rehabilitation Services Office: [phone] Secure Chat Preferred  Delon CHRISTELLA Callander 02/02/2024, 9:25 AM

## 2024-02-02 NOTE — Progress Notes (Signed)
    Subjective: Procedure(s) (LRB): EXTTEME LATERAL INTERBODY FUSION OF LUMBAR THREE TO LUMBAR FOUR WITH POSTERIOR SUPPLEMENTAL FUSION INSTRUMENTED OF LUMBAR THREE TO LUMBAR FOUR AND LUMBAR FOUR AND LUMBAR FIVE WITH LUMBAR DECOMPRESSION AT LUMBAR TWO TO LUMBAR THREE (N/A) POSTERIOR LUMBAR FUSION 2 LEVEL (N/A) 1 Day Post-Op  Patient reports pain as 7 on 0-10 scale.  Reports decreased leg pain reports incisional back pain  and flank pain  Positive void Negative bowel movement Positive flatus Negative chest pain or shortness of breath  Objective: Vital signs in last 24 hours: Temp:  [98 F (36.7 C)-100 F (37.8 C)] 98.7 F (37.1 C) (12/05 0359) Pulse Rate:  [57-88] 88 (12/05 0359) Resp:  [12-20] 17 (12/05 0359) BP: (97-125)/(60-97) 125/75 (12/05 0359) SpO2:  [92 %-99 %] 93 % (12/05 0359)  Intake/Output from previous day: 12/04 0701 - 12/05 0700 In: 4000 [I.V.:3900; IV Piggyback:100] Out: 250 [Urine:100; Blood:150]  Labs: No results for input(s): WBC, RBC, HCT, PLT in the last 72 hours. No results for input(s): NA, K, CL, CO2, BUN, CREATININE, GLUCOSE, CALCIUM in the last 72 hours. No results for input(s): LABPT, INR in the last 72 hours.  Physical Exam: Sensation intact distally Intact pulses distally Dorsiflexion/Plantar flexion intact Incision: dressing C/D/I No cellulitis present Compartment soft Body mass index is 30.41 kg/m.   Assessment/Plan: Francisco Cowan is a very pleasant 73 year old male who is POD 1 from XLIF L3-L4 with L2-L3 decompression and extension of PSFI L3-L5. Surgical intervention was successful and without complications. His hospital course has been uncomplicated. He states that his pre-operative leg pain is much improved since surgery. He is ambulating on his own. He is tolerating oral intake well. He is compliant with the LSO brace. He is complaint with the incentive spirometer. He reports that his pain is well controled with oral  pain medications. Dressing is c/d/I.  Positive void, positive flatus, negative BM.  Plan: Patient stable  Continue mobilization with physical therapy Continue care  Continue pain medical mgmt  Continue to utilize LSO brace  Continue pulm toilet   Plan to d/c to home possibly tomorrow if cleared by PT/OT and pain is controlled  Jeoffrey Sages PA-C for Dr. Donaciano Sprang, MD [facility] [phone]

## 2024-02-02 NOTE — Progress Notes (Signed)
 Report given to 5N nurse but DR Burnetta called he want to see the patient before transfer. Awaiting MD to see the patient.

## 2024-02-02 NOTE — Discharge Summary (Signed)
 Patient ID: Francisco Cowan MRN: [id number] DOB/AGE: 72-31-1952 72 y.o.  Admit date: 02/01/2024 Discharge date: 02/04/2024  Admission Diagnoses:  Principal Problem:   S/P lumbar fusion   Discharge Diagnoses:  Principal Problem:   S/P lumbar fusion  status post Procedure(s): EXTTEME LATERAL INTERBODY FUSION OF LUMBAR THREE TO LUMBAR FOUR WITH POSTERIOR SUPPLEMENTAL FUSION INSTRUMENTED OF LUMBAR THREE TO LUMBAR FOUR AND LUMBAR FOUR AND LUMBAR FIVE WITH LUMBAR DECOMPRESSION AT LUMBAR TWO TO LUMBAR THREE POSTERIOR LUMBAR FUSION 2 LEVEL  Past Medical History:  Diagnosis Date   Abnormal ECG 09/19/2017   NSR. Left axis deviation.    Alcohol abuse    Anxiety    Cutaneous T-cell lymphoma (HCC)    Depression    ED (erectile dysfunction)    GERD (gastroesophageal reflux disease)    History of kidney stones    Hypertension    Moderate tetrahydrocannabinol (THC) dependence (HCC)    Neuropathy    arms and legs   Photodermatitis    Sleep apnea    uses CPAP   Spinal stenosis    Urge incontinence     Surgeries: Procedure(s): EXTTEME LATERAL INTERBODY FUSION OF LUMBAR THREE TO LUMBAR FOUR WITH POSTERIOR SUPPLEMENTAL FUSION INSTRUMENTED OF LUMBAR THREE TO LUMBAR FOUR AND LUMBAR FOUR AND LUMBAR FIVE WITH LUMBAR DECOMPRESSION AT LUMBAR TWO TO LUMBAR THREE POSTERIOR LUMBAR FUSION 2 LEVEL on 02/01/2024   Consultants:   Discharged Condition: Improved  Hospital Course: Francisco Cowan is an 73 y.o. male who was admitted 02/01/2024 for operative treatment of S/P lumbar fusion. Patient failed conservative treatments (please see the history and physical for the specifics) and had severe unremitting pain that affects sleep, daily activities and work/hobbies. After pre-op clearance, the patient was taken to the operating room on 02/01/2024 and underwent  Procedure(s): EXTTEME LATERAL INTERBODY FUSION OF LUMBAR THREE TO LUMBAR FOUR WITH POSTERIOR SUPPLEMENTAL FUSION INSTRUMENTED OF LUMBAR  THREE TO LUMBAR FOUR AND LUMBAR FOUR AND LUMBAR FIVE WITH LUMBAR DECOMPRESSION AT LUMBAR TWO TO LUMBAR THREE POSTERIOR LUMBAR FUSION 2 LEVEL.    Patient was given perioperative antibiotics:  Anti-infectives (From admission, onward)    Start     Dose/Rate Route Frequency Ordered Stop   02/01/24 1615  ceFAZolin  (ANCEF ) IVPB 1 g/50 mL premix        1 g 100 mL/hr over 30 Minutes Intravenous Every 8 hours 02/01/24 1551 02/02/24 0800   02/01/24 0615  ceFAZolin  (ANCEF ) 2-4 GM/100ML-% IVPB       Note to Pharmacy: Joli Search N: cabinet override      02/01/24 0615 02/01/24 0818   02/01/24 9391  ceFAZolin  (ANCEF ) IVPB 2g/100 mL premix        2 g 200 mL/hr over 30 Minutes Intravenous 30 min pre-op 02/01/24 9391 02/01/24 0810        Patient was given sequential compression devices and early ambulation to prevent DVT.   Patient benefited maximally from hospital stay and there were no complications. At the time of discharge, the patient was urinating/moving their bowels without difficulty, tolerating a regular diet, pain is controlled with oral pain medications and they have been cleared by PT/OT.   Recent vital signs: Patient Vitals for the past 24 hrs:  BP Temp Temp src Pulse Resp SpO2  02/04/24 0803 121/79 -- -- (!) 146 20 94 %  02/04/24 0751 90/75 98.2 F (36.8 C) -- (!) 147 16 95 %  02/04/24 0538 137/88 (!) 97.4 F (36.3 C) Oral 85 17 93 %  02/03/24 2002 136/81 98.4 F (36.9 C) Oral 90 16 94 %  02/03/24 1447 123/63 98.9 F (37.2 C) -- 89 16 93 %  02/03/24 1026 136/74 -- -- -- -- --     Recent laboratory studies: No results for input(s): WBC, HGB, HCT, PLT, NA, K, CL, CO2, BUN, CREATININE, GLUCOSE, INR, CALCIUM in the last 72 hours.  Invalid input(s): PT, 2   Discharge Medications:   Allergies as of 02/04/2024       Reactions   Nsaids Other (See Comments)   GI Bleeding   Aspirin    Other Reaction(s): Not available   Cortisone    Pt stated, I  am only allergic to cortisone injections Pt states he thought he had anaphylactic reaction to cortisone injection, but he has had cortisone injections since        Medication List     TAKE these medications    acetaminophen  325 MG tablet Commonly known as: TYLENOL  Take 2 tablets (650 mg total) by mouth every 6 (six) hours as needed for up to 20 days for mild pain (pain score 1-3) or fever (or temp > 100.5).   allopurinol  300 MG tablet Commonly known as: ZYLOPRIM  Take 0.5 tablets (150 mg total) by mouth daily.   amLODipine  2.5 MG tablet Commonly known as: NORVASC  Take 2.5 mg by mouth daily.   ammonium lactate 12 % lotion Commonly known as: LAC-HYDRIN Apply 1 Application topically as needed for dry skin.   B-12 PO Take 1 tablet by mouth daily.   bisoprolol  5 MG tablet Commonly known as: ZEBETA  Take 1 tablet (5 mg total) by mouth daily.   buPROPion  300 MG 24 hr tablet Commonly known as: WELLBUTRIN  XL TAKE 1 TABLET (300 MG TOTAL) BY MOUTH DAILY. TAKING WITH WELLBUTRIN  SR 150MG  FOR 450MG  TOTAL DAILY.   buPROPion  150 MG 24 hr tablet Commonly known as: Wellbutrin  XL Take 1 tablet (150 mg total) by mouth daily. Take with 300 mg tablet for 450 mg total daily   celecoxib  50 MG capsule Commonly known as: CeleBREX  Take 1 capsule (50 mg total) by mouth 2 (two) times daily as needed for pain.   cetirizine 10 MG tablet Commonly known as: ZYRTEC Take 10 mg by mouth daily.   clobetasol cream 0.05 % Commonly known as: TEMOVATE Apply 1 Application topically 2 (two) times daily as needed (Dermatitis).   lisinopril  10 MG tablet Commonly known as: ZESTRIL  Take 10 mg by mouth daily.   LORazepam  1 MG tablet Commonly known as: ATIVAN  Take 1 tablet (1 mg total) by mouth every 8 (eight) hours as needed for anxiety or sleep.   methocarbamol  500 MG tablet Commonly known as: ROBAXIN  Take 1 tablet (500 mg total) by mouth every 6 (six) hours as needed for up to 20 days for muscle  spasms.   multivitamin capsule Take 1 capsule by mouth daily.   oxyCODONE  5 MG immediate release tablet Commonly known as: Oxy IR/ROXICODONE  Take 1 tablet (5 mg total) by mouth every 4 (four) hours as needed for moderate pain (pain score 4-6).   pantoprazole  40 MG tablet Commonly known as: PROTONIX  Take 1 tablet (40 mg total) by mouth daily.   PROBIOTIC PO Take 1 tablet by mouth daily.   SALMON OIL PO Take 1 Dose by mouth daily.   senna-docusate 8.6-50 MG tablet Commonly known as: Senokot-S Take 1 tablet by mouth 2 (two) times daily for 20 days.   sertraline  100 MG tablet Commonly known as: ZOLOFT  Take  1 tablet (100 mg total) by mouth daily.   tamsulosin  0.4 MG Caps capsule Commonly known as: FLOMAX  TAKE 1 CAPSULE BY MOUTH EVERY DAY   traZODone  100 MG tablet Commonly known as: DESYREL  Take 1-2 tablets (100-200 mg total) by mouth at bedtime as needed. What changed:  how much to take when to take this        Diagnostic Studies: DG Lumbar Spine 2-3 Views Result Date: 02/01/2024 CLINICAL DATA:  Elective surgery EXAM: LUMBAR SPINE - 2-3 VIEW COMPARISON:  None Available. FINDINGS: Sixteen fluoroscopic spot views of the lumbar spine submitted from the operating room. Image 1 demonstrates posterior fusion hardware at L4-L5 with interbody spacer. Subsequent interbody spacer at L3-L4 with extension of posterior hardware to the L3-L4 level. Fluoroscopy time 3 minutes 34 seconds. Dose 194.88 mGy. IMPRESSION: Intraoperative fluoroscopy during lumbar fusion. Electronically Signed   By: Andrea Gasman M.D.   On: 02/01/2024 14:54   DG C-Arm 1-60 Min-No Report Result Date: 02/01/2024 Fluoroscopy was utilized by the requesting physician.  No radiographic interpretation.   DG C-Arm 1-60 Min-No Report Result Date: 02/01/2024 Fluoroscopy was utilized by the requesting physician.  No radiographic interpretation.   DG C-Arm 1-60 Min-No Report Result Date: 02/01/2024 Fluoroscopy was  utilized by the requesting physician.  No radiographic interpretation.   DG C-Arm 1-60 Min-No Report Result Date: 02/01/2024 Fluoroscopy was utilized by the requesting physician.  No radiographic interpretation.   DG C-Arm 1-60 Min-No Report Result Date: 02/01/2024 Fluoroscopy was utilized by the requesting physician.  No radiographic interpretation.    Discharge Instructions     Call MD / Call 911   Complete by: As directed    If you experience chest pain or shortness of breath, CALL 911 and be transported to the hospital emergency room.  If you develope a fever above 101 F, pus (Rickey Farrier drainage) or increased drainage or redness at the wound, or calf pain, call your surgeon's office.   Constipation Prevention   Complete by: As directed    Drink plenty of fluids.  Prune juice may be helpful.  You may use a stool softener, such as Colace (over the counter) 100 mg twice a day.  Use MiraLax  (over the counter) for constipation as needed.   Diet - low sodium heart healthy   Complete by: As directed    Increase activity slowly as tolerated   Complete by: As directed    Please use LSO at all time when ambulating   Post-operative opioid taper instructions:   Complete by: As directed    POST-OPERATIVE OPIOID TAPER INSTRUCTIONS: It is important to wean off of your opioid medication as soon as possible. If you do not need pain medication after your surgery it is ok to stop day one. Opioids include: Codeine, Hydrocodone(Norco, Vicodin), Oxycodone (Percocet, oxycontin ) and hydromorphone  amongst others.  Long term and even short term use of opiods can cause: Increased pain response Dependence Constipation Depression Respiratory depression And more.  Withdrawal symptoms can include Flu like symptoms Nausea, vomiting And more Techniques to manage these symptoms Hydrate well Eat regular healthy meals Stay active Use relaxation techniques(deep breathing, meditating, yoga) Do Not substitute  Alcohol to help with tapering If you have been on opioids for less than two weeks and do not have pain than it is ok to stop all together.  Plan to wean off of opioids This plan should start within one week post op of your joint replacement. Maintain the same interval or time between taking each  dose and first decrease the dose.  Cut the total daily intake of opioids by one tablet each day Next start to increase the time between doses. The last dose that should be eliminated is the evening dose.           Contact information for after-discharge care     Home Medical Care     [facility] .   Service: Home Health Services Contact information: [address] [place]  [phone]                     Discharge Plan:  discharge to home   Disposition:  Francisco Cowan is a very pleasant 73 year old male who is POD 3 from XLIF L3-L4 with L2-L3 decompression and extension of PSFI L3-L5. Surgical intervention was successful and without complications. His hospital course has been uncomplicated. He states that his pre-operative leg pain is much improved since surgery. He is ambulating on his own. He is tolerating oral intake well. He is compliant with the LSO brace. He is complaint with the incentive spirometer. He reports that his pain is well controled with oral pain medications. Dressing is c/d/I.  Positive void, positive flatus, negative BM.    Signed: Rankin Pizza, MD  [facility] [phone] 02/04/2024, 8:14 AM

## 2024-02-02 NOTE — Evaluation (Signed)
 Occupational Therapy Evaluation Patient Details Name: Francisco Cowan MRN: [id number] DOB: Nov 12, 1950 Today's Date: 02/02/2024   History of Present Illness   73 y.o. 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 Pt s/p XLIF L3-L4 with L2-L3 decompression and extension of PSFI L3-L5 12/4. PMHx: ETOH abuse, anxiety, Tcell lymphoma, HTN, neuropathy, GERD, and OSA on CPAP.     Clinical Impressions Pt admitted based on above, and was seen based on problem list below. PTA pt was independent with ADLs and IADLs. Today pt is requiring set up  to mod assist for ADLs. Bed mobility was min assist to log roll and functional transfers are  CGA with RW. Provided and reviewed back precautions handout with pt. Pt showed poor recall from within the session; noted cog deficits in stm, problem solving, and sequencing. Pt would benefit from further reinforcement of precautions. Based on performance today, recommending HHOT and BSC for d/c. OT will continue to follow acutely to maximize functional independence.     If plan is discharge home, recommend the following:   A little help with walking and/or transfers;A little help with bathing/dressing/bathroom;Assist for transportation;Assistance with cooking/housework     Functional Status Assessment   Patient has had a recent decline in their functional status and demonstrates the ability to make significant improvements in function in a reasonable and predictable amount of time.     Equipment Recommendations   BSC/3in1      Precautions/Restrictions   Precautions Precautions: Fall;Back Precaution Booklet Issued: Yes (comment) Recall of Precautions/Restrictions: Impaired Required Braces or Orthoses: Spinal Brace Spinal Brace: Lumbar corset Restrictions Weight Bearing Restrictions Per Provider Order: No     Mobility Bed Mobility Overal bed mobility: Needs Assistance Bed  Mobility: Rolling, Sidelying to Sit, Sit to Sidelying Rolling: Min assist Sidelying to sit: Min assist     Sit to sidelying: Min assist General bed mobility comments: Heavy multimodal cueing needed    Transfers Overall transfer level: Needs assistance Equipment used: Rolling walker (2 wheels) Transfers: Sit to/from Stand Sit to Stand: Contact guard assist           General transfer comment: CGA for balance. Cueing for hand placement. Reliant on GB from toilet      Balance Overall balance assessment: Needs assistance Sitting-balance support: Bilateral upper extremity supported, Feet supported Sitting balance-Leahy Scale: Fair     Standing balance support: Bilateral upper extremity supported, During functional activity, Reliant on assistive device for balance Standing balance-Leahy Scale: Poor Standing balance comment: Reliant on RW       ADL either performed or assessed with clinical judgement   ADL Overall ADL's : Needs assistance/impaired Eating/Feeding: Set up;Sitting   Grooming: Set up;Sitting           Upper Body Dressing : Set up;Sitting   Lower Body Dressing: Moderate assistance;Sit to/from stand   Toilet Transfer: Contact guard assist;Ambulation;Rolling walker (2 wheels);Grab bars Toilet Transfer Details (indicate cue type and reason): CGA for balance, heavy reliance on GB Toileting- Clothing Manipulation and Hygiene: Minimal assistance;Sit to/from stand       Functional mobility during ADLs: Contact guard assist;Rolling walker (2 wheels) General ADL Comments: Cueing for compensatory techniques and adhering to back precautions     Vision Baseline Vision/History: 0 No visual deficits Vision Assessment?: No apparent visual deficits            Pertinent Vitals/Pain Pain Assessment Pain Assessment: 0-10 Pain Score: 7  Pain Location: back Pain  Descriptors / Indicators: Grimacing, Discomfort, Guarding Pain Intervention(s): Monitored during  session     Extremity/Trunk Assessment Upper Extremity Assessment Upper Extremity Assessment: Generalized weakness   Lower Extremity Assessment Lower Extremity Assessment: Defer to PT evaluation   Cervical / Trunk Assessment Cervical / Trunk Assessment: Back Surgery   Communication Communication Communication: Impaired Factors Affecting Communication: Hearing impaired   Cognition Arousal: Alert Behavior During Therapy: Restless, Impulsive Cognition: Cognition impaired     Awareness: Online awareness impaired Memory impairment (select all impairments): Short-term memory, Working Biochemist, Clinical functioning impairment (select all impairments): Sequencing, Problem solving, Reasoning OT - Cognition Comments: Difficulty following commands, recalling precautions, requiring multimodal cueing     Following commands: Intact       Cueing  General Comments   Cueing Techniques: Verbal cues;Tactile cues;Gestural cues  Surgical dressing intact           Home Living Family/patient expects to be discharged to:: Private residence Living Arrangements: Spouse/significant other Available Help at Discharge: Family;Available 24 hours/day Type of Home: House Home Access: Ramped entrance     Home Layout: One level     Bathroom Shower/Tub: Walk-in shower;Tub/shower unit   Bathroom Toilet: Handicapped height     Home Equipment: Shower seat;Hand held shower head;Adaptive equipment;Grab bars - toilet;Grab bars - tub/shower Adaptive Equipment: Reacher Additional Comments: Pt poor historian, often responding only responding yes to questions. Most of home set up from Prior admission      Prior Functioning/Environment Prior Level of Function : Independent/Modified Independent;Patient poor historian/Family not available             Mobility Comments: Ind, reports no AD ADLs Comments: Ind    OT Problem List: Decreased strength;Decreased range of motion;Decreased  activity tolerance;Impaired balance (sitting and/or standing);Decreased cognition;Decreased safety awareness;Decreased knowledge of use of DME or AE;Decreased knowledge of precautions   OT Treatment/Interventions: Self-care/ADL training;Therapeutic exercise;Energy conservation;DME and/or AE instruction;Therapeutic activities;Patient/family education;Balance training      OT Goals(Current goals can be found in the care plan section)   Acute Rehab OT Goals Patient Stated Goal: To go home OT Goal Formulation: With patient Time For Goal Achievement: 02/16/24 Potential to Achieve Goals: Good   OT Frequency:  Min 2X/week       AM-PAC OT 6 Clicks Daily Activity     Outcome Measure Help from another person eating meals?: None Help from another person taking care of personal grooming?: A Little Help from another person toileting, which includes using toliet, bedpan, or urinal?: A Lot Help from another person bathing (including washing, rinsing, drying)?: A Lot Help from another person to put on and taking off regular upper body clothing?: A Little Help from another person to put on and taking off regular lower body clothing?: A Lot 6 Click Score: 16   End of Session Equipment Utilized During Treatment: Rolling walker (2 wheels);Back brace Nurse Communication: Mobility status  Activity Tolerance: Patient limited by pain Patient left: in bed;with call bell/phone within reach  OT Visit Diagnosis: Unsteadiness on feet (R26.81);Other abnormalities of gait and mobility (R26.89);Muscle weakness (generalized) (M62.81)                Time: 9266-9245 OT Time Calculation (min): 21 min Charges:  OT General Charges $OT Visit: 1 Visit OT Evaluation $OT Eval Moderate Complexity: 1 Mod  Adrianne BROCKS, OT  Acute Rehabilitation Services Office [phone] Secure chat preferred   Adrianne GORMAN Savers 02/02/2024, 9:01 AM

## 2024-02-03 NOTE — Progress Notes (Signed)
 Occupational Therapy Treatment Patient Details Name: Francisco Cowan MRN: [id number] DOB: [birth year] Today's Date: 02/03/2024   History of present illness 72 y.o. 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 Pt s/p XLIF L3-L4 with L2-L3 decompression and extension of PSFI L3-L5 12/4. PMHx: ETOH abuse, anxiety, Tcell lymphoma, HTN, neuropathy, GERD, and OSA on CPAP.   OT comments  Pt. Seen for skilled OT treatment session with wife present for duration of tx session.  Pt. Grimacing with pain but also with eyes closed dozing in/out during session.  Max a to assist with repositioning in the chair.  Family education with pts. Wife with steps and sequencing for don/doff brace, back precautions, and Discussed bathing options including demonstration for shower stall transfer. Mobility deferred secondary to pts. Level of lethargy.  Pts. Spouse expresses concerns for pain management at home and had questions regarding HH services.  Rn made aware.  Cont. With acute OT POC.        If plan is discharge home, recommend the following:  A little help with walking and/or transfers;A little help with bathing/dressing/bathroom;Assist for transportation;Assistance with cooking/housework   Equipment Recommendations  BSC/3in1    Recommendations for Other Services      Precautions / Restrictions Precautions Precautions: Fall;Back Recall of Precautions/Restrictions: Impaired Precaution/Restrictions Comments: Educated pt on back precautions. He was able to verbalize 1/3. Required Braces or Orthoses: Spinal Brace Spinal Brace: Lumbar corset       Mobility Bed Mobility               General bed mobility comments: pt. in recliner, too lethargic for mobilty attempts    Transfers                         Balance                                           ADL either performed or assessed with clinical  judgement   ADL Overall ADL's : Needs assistance/impaired                 Upper Body Dressing : Total assistance;Sitting;With caregiver independent assisting;Cueing for sequencing Upper Body Dressing Details (indicate cue type and reason): educated and had wife demonstrate donning brace in sitting   Lower Body Dressing Details (indicate cue type and reason): pts. wife declined need for A/E introduction and review, states she will assist with LB ADLs.  reviewed preloading garments for energy conservation         Tub/ Shower Transfer: Walk-in shower;With caregiver independent assisting;Cueing for sequencing;Rolling walker (2 wheels);Shower Field Seismologist Details (indicate cue type and reason): demonstrated walk in shower transfer for pts. wife using rw with stepping backwards over the ledge.  also reviewed sponge bathing as an option   General ADL Comments: family education with pts. wife.  reviewed don/doff brace, back precautions, bathing options, and LB ADL tech.    Extremity/Trunk Assessment              Vision       Haematologist Communication Communication: Impaired Factors Affecting Communication: Hearing impaired   Cognition Arousal: Lethargic Behavior During Therapy: Flat affect               OT - Cognition  Comments: eyes closed and dozing in/out throughout session                 Following commands: Intact        Cueing   Cueing Techniques: Verbal cues, Tactile cues, Gestural cues  Exercises      Shoulder Instructions       General Comments      Pertinent Vitals/ Pain       Pain Assessment Pain Assessment: Faces Faces Pain Scale: Hurts even more Pain Location: Back Pain Descriptors / Indicators: Grimacing, Discomfort, Guarding Pain Intervention(s): Repositioned, Premedicated before session  Home Living                                          Prior  Functioning/Environment              Frequency  Min 2X/week        Progress Toward Goals  OT Goals(current goals can now be found in the care plan section)  Progress towards OT goals: Progressing toward goals     Plan      Co-evaluation                 AM-PAC OT 6 Clicks Daily Activity     Outcome Measure   Help from another person eating meals?: None Help from another person taking care of personal grooming?: A Little Help from another person toileting, which includes using toliet, bedpan, or urinal?: A Lot Help from another person bathing (including washing, rinsing, drying)?: A Lot Help from another person to put on and taking off regular upper body clothing?: A Little Help from another person to put on and taking off regular lower body clothing?: A Lot 6 Click Score: 16    End of Session Equipment Utilized During Treatment: Back brace  OT Visit Diagnosis: Unsteadiness on feet (R26.81);Other abnormalities of gait and mobility (R26.89);Muscle weakness (generalized) (M62.81)   Activity Tolerance Patient limited by lethargy;Patient limited by pain   Patient Left in chair;with call bell/phone within reach;with family/visitor present   Nurse Communication          Time: 8682-8666 OT Time Calculation (min): 16 min  Charges: OT General Charges $OT Visit: 1 Visit OT Treatments $Self Care/Home Management : 8-22 mins  Randall, COTA/L Acute Rehabilitation [phone]   Francisco Cowan Lorraine-COTA/L  02/03/2024, 2:01 PM

## 2024-02-03 NOTE — Progress Notes (Signed)
   Subjective:  73 y.o. male now POD 2  from XLIF L3-L4 with L2-L3 decompression and extension of PSFI L3-L5. Patient reports that the bed is uncomfortable, but that his right leg feels improved from pre op  Otherwise, no acute events overnight. No numbness or tingling to the extremity.  Objective:   VITALS:   Vitals:   02/02/24 1702 02/02/24 2042 02/03/24 0548 02/03/24 0752  BP: (!) 148/81 138/74 132/78 136/74  Pulse: 89 88 84 89  Resp:  17 19 16   Temp: 99.4 F (37.4 C) 98.2 F (36.8 C) 98.4 F (36.9 C) 98.3 F (36.8 C)  TempSrc: Oral Oral    SpO2: 94% 96% 92% 93%  Weight:      Height:        Physical Exam: General: Alert, no acute distress Cardiovascular: No pedal edema Respiratory: No cyanosis, no use of accessory musculature GI: No organomegaly, abdomen is soft and non-tender Skin: No lesions in the area of chief complaint Neurologic: Sensation intact distally Psychiatric: Patient is competent for consent with normal mood and affect Lymphatic: No axillary or cervical lymphadenopathy  MUSCULOSKELETAL: Lumbar spine - Dressings c/d/i - TTP peri incisionally - Motor intact 5/5 strength L3-S1 - Sensation intact to L3-S1 - 2+ DP pulse  Lab Results  Component Value Date   WBC 7.4 11/13/2017   HGB 15.0 11/13/2017   HCT 45.4 11/13/2017   MCV 97.6 11/13/2017   PLT 181 11/13/2017   BMET    Component Value Date/Time   NA 135 08/09/2019 1530   K 4.4 08/09/2019 1530   CL 100 08/09/2019 1530   CO2 25 08/09/2019 1530   GLUCOSE 133 (H) 08/09/2019 1530   BUN 20 08/09/2019 1530   CREATININE 0.98 08/09/2019 1530   CALCIUM 10.0 08/09/2019 1530   GFRNONAA >60 11/13/2017 1200     Assessment/Plan: 73 y.o. male  now 2 Days Post-Op from Principal Problem:   S/P lumbar fusion .  Patient is doing well POD 2 from XLIF L3-L4 with L2-L3 decompression and extension of PSFI L3-L5. Plan for discharge today pending clearance from PT  Continue to mobilize as tolerated, LSO when  out of bed Continue pulm toilet Pain control: oxy and tylenol  PT/OT DVT ppx: SCDs Bowel regimen: senna docusate Dispo: today pending PT clearance   Rankin ORN Lysander Calixte 02/03/2024, 8:03 AM

## 2024-02-03 NOTE — Plan of Care (Signed)
  Problem: Education: Goal: Knowledge of General Education information will improve Description: Including pain rating scale, medication(s)/side effects and non-pharmacologic comfort measures Outcome: Progressing   Problem: Health Behavior/Discharge Planning: Goal: Ability to manage health-related needs will improve Outcome: Progressing   Problem: Clinical Measurements: Goal: Ability to maintain clinical measurements within normal limits will improve Outcome: Progressing Goal: Will remain free from infection Outcome: Progressing Goal: Diagnostic test results will improve Outcome: Progressing Goal: Respiratory complications will improve Outcome: Progressing Goal: Cardiovascular complication will be avoided Outcome: Progressing   Problem: Nutrition: Goal: Adequate nutrition will be maintained Outcome: Progressing   Problem: Coping: Goal: Level of anxiety will decrease Outcome: Progressing   Problem: Elimination: Goal: Will not experience complications related to bowel motility Outcome: Progressing Goal: Will not experience complications related to urinary retention Outcome: Progressing   Problem: Education: Goal: Ability to verbalize activity precautions or restrictions will improve Outcome: Progressing Goal: Knowledge of the prescribed therapeutic regimen will improve Outcome: Progressing Goal: Understanding of discharge needs will improve Outcome: Progressing   Problem: Bowel/Gastric: Goal: Gastrointestinal status for postoperative course will improve Outcome: Progressing   Problem: Pain Management: Goal: Pain level will decrease Outcome: Progressing   Problem: Skin Integrity: Goal: Will show signs of wound healing Outcome: Progressing   Problem: Bladder/Genitourinary: Goal: Urinary functional status for postoperative course will improve Outcome: Progressing

## 2024-02-03 NOTE — Progress Notes (Signed)
 Physical Therapy Treatment Patient Details Name: Francisco Cowan MRN: [id number] DOB: Nov 06, 1950 Today's Date: 02/03/2024   History of Present Illness 73 y.o. 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 Pt s/p XLIF L3-L4 with L2-L3 decompression and extension of PSFI L3-L5 12/4. PMHx: ETOH abuse, anxiety, Tcell lymphoma, HTN, neuropathy, GERD, and OSA on CPAP.    PT Comments  Pt supine in bed on arrival.  He is eager to use bathroom.  He remains anxious/impulsive and required frequent cues to maintain spinal precautions during session.  Today pain is better managed but was not yesterday.  Pt continues to require support from caregiver to maintain compliance with spinal precautions and to donn and doff his lumbar corsett at home.  He continues to benefit from rehab in acute setting before d/c home.      If plan is discharge home, recommend the following: A little help with walking and/or transfers;A little help with bathing/dressing/bathroom;Assistance with cooking/housework;Assist for transportation;Help with stairs or ramp for entrance   Can travel by private vehicle        Equipment Recommendations  None recommended by PT (has all DME)    Recommendations for Other Services       Precautions / Restrictions Precautions Precautions: Fall;Back Precaution Booklet Issued: Yes (comment) Recall of Precautions/Restrictions: Impaired Precaution/Restrictions Comments: Educated pt on back precautions. He was able to verbalize 1/3 with cues by end of session. Pt required max cues to abide by back precautions throughout mobility. Required Braces or Orthoses: Spinal Brace Spinal Brace: Lumbar corset (total assistance to donn) Restrictions Weight Bearing Restrictions Per Provider Order: No     Mobility  Bed Mobility Overal bed mobility: Needs Assistance Bed Mobility: Rolling, Sidelying to Sit Rolling: Contact guard assist, Used  rails (Pt required cues to avoid twisting and place L knee into hooklying to push and roll with heavy use of rail.) Sidelying to sit: Contact guard assist, HOB elevated, Used rails       General bed mobility comments: Educated pt on log roll technique. Step by step ceus for sequencing. Assist to come into sidelying and elevate trunk.  Required total assistance to donn lumbar corsett in sitting.  He and wife educated on technique to place lumbar corsett and where/how to tighten the corsett    Transfers Overall transfer level: Needs assistance Equipment used: Rolling walker (2 wheels) Transfers: Sit to/from Stand Sit to Stand: Contact guard assist           General transfer comment: Pt presents with poor hand placement and multiple attempts to rise into standing. On additional attempts with max VCs he is able to follow commands for hand placement to improve ease of transfer.  No physical assistance is needed to move from surface to standing.  Mildly impulsive/anxious and moves fast.    Ambulation/Gait Ambulation/Gait assistance: Contact guard assist Gait Distance (Feet): 10 Feet (x2) Assistive device: Rolling walker (2 wheels) Gait Pattern/deviations: Step-to pattern, Decreased step length - right, Decreased step length - left, Decreased stride length, Trunk flexed Gait velocity: decreased     General Gait Details: Pt took short slow steps with heavy reliance on BUE support. Cued closer proximity to RW and VC/TC to improve upright posture. Pt refused further gt distance 2 to fatigue.   Stairs             Wheelchair Mobility     Tilt Bed    Modified Rankin (Stroke Patients Only)  Balance Overall balance assessment: Needs assistance Sitting-balance support: Bilateral upper extremity supported, Feet supported Sitting balance-Leahy Scale: Fair       Standing balance-Leahy Scale: Poor                              Communication  Communication Communication: Impaired Factors Affecting Communication: Hearing impaired  Cognition Arousal: Alert Behavior During Therapy: Restless, Impulsive   PT - Cognitive impairments: Safety/Judgement, Sequencing, Problem solving, Memory                       PT - Cognition Comments: Pt A,Ox4. He has delayed processing and demonstrated difficulty following back precautions despite multi-modal cues. unable to recall 3/3 precautions for back. Following commands: Intact      Cueing Cueing Techniques: Verbal cues, Tactile cues, Gestural cues  Exercises      General Comments        Pertinent Vitals/Pain Pain Assessment Pain Assessment: Faces Faces Pain Scale: Hurts little more Pain Location: Back Pain Descriptors / Indicators: Grimacing, Discomfort, Guarding Pain Intervention(s): Monitored during session, Repositioned    Home Living                          Prior Function            PT Goals (current goals can now be found in the care plan section) Acute Rehab PT Goals Patient Stated Goal: Have less pain Potential to Achieve Goals: Fair Progress towards PT goals: Progressing toward goals    Frequency    Min 5X/week      PT Plan      Co-evaluation              AM-PAC PT 6 Clicks Mobility   Outcome Measure  Help needed turning from your back to your side while in a flat bed without using bedrails?: A Little Help needed moving from lying on your back to sitting on the side of a flat bed without using bedrails?: A Little Help needed moving to and from a bed to a chair (including a wheelchair)?: A Little Help needed standing up from a chair using your arms (e.g., wheelchair or bedside chair)?: A Little Help needed to walk in hospital room?: A Little Help needed climbing 3-5 steps with a railing? : A Little 6 Click Score: 18    End of Session Equipment Utilized During Treatment: Gait belt;Back brace Activity Tolerance: Patient  tolerated treatment well;Patient limited by pain Patient left: in chair;with call bell/phone within reach Nurse Communication: Mobility status PT Visit Diagnosis: Difficulty in walking, not elsewhere classified (R26.2);Muscle weakness (generalized) (M62.81);Other abnormalities of gait and mobility (R26.89);Unsteadiness on feet (R26.81);Pain Pain - part of body:  (back)     Time: 1255-1320 PT Time Calculation (min) (ACUTE ONLY): 25 min  Charges:    $Gait Training: 8-22 mins $Therapeutic Activity: 8-22 mins PT General Charges $$ ACUTE PT VISIT: 1 Visit                     Toya HAMS , PTA Acute Rehabilitation Services Office [phone]    Grabiela Wohlford JINNY Gosling 02/03/2024, 1:32 PM

## 2024-02-04 DIAGNOSIS — Z789 Other specified health status: Secondary | ICD-10-CM

## 2024-02-04 DIAGNOSIS — I471 Supraventricular tachycardia, unspecified: Secondary | ICD-10-CM | POA: Insufficient documentation

## 2024-02-04 LAB — BASIC METABOLIC PANEL WITH GFR
Anion gap: 11 (ref 5–15)
BUN: 16 mg/dL (ref 8–23)
CO2: 25 mmol/L (ref 22–32)
Calcium: 8.5 mg/dL — ABNORMAL LOW (ref 8.9–10.3)
Chloride: 100 mmol/L (ref 98–111)
Creatinine, Ser: 1 mg/dL (ref 0.61–1.24)
GFR, Estimated: >60 mL/min (ref >60)
Glucose, Bld: 110 mg/dL — ABNORMAL HIGH (ref 70–99)
Potassium: 3.7 mmol/L (ref 3.5–5.1)
Sodium: 136 mmol/L (ref 135–145)

## 2024-02-04 LAB — CBC WITH DIFFERENTIAL/PLATELET
Abs Immature Granulocytes: 0.09 K/uL — ABNORMAL HIGH (ref 0.00–0.07)
Basophils Absolute: 0 K/uL (ref 0.0–0.1)
Basophils Relative: 0 %
Eosinophils Absolute: 0.1 K/uL (ref 0.0–0.5)
Eosinophils Relative: 1 %
HCT: 36.2 % — ABNORMAL LOW (ref 39.0–52.0)
Hemoglobin: 12.5 g/dL — ABNORMAL LOW (ref 13.0–17.0)
Immature Granulocytes: 1 %
Lymphocytes Relative: 10 %
Lymphs Abs: 1.1 K/uL (ref 0.7–4.0)
MCH: 32.1 pg (ref 26.0–34.0)
MCHC: 34.5 g/dL (ref 30.0–36.0)
MCV: 92.8 fL (ref 80.0–100.0)
Monocytes Absolute: 0.8 K/uL (ref 0.1–1.0)
Monocytes Relative: 7 %
Neutro Abs: 9.3 K/uL — ABNORMAL HIGH (ref 1.7–7.7)
Neutrophils Relative %: 81 %
Platelets: 130 K/uL — ABNORMAL LOW (ref 150–400)
RBC: 3.9 MIL/uL — ABNORMAL LOW (ref 4.22–5.81)
RDW: 13 % (ref 11.5–15.5)
WBC: 11.4 K/uL — ABNORMAL HIGH (ref 4.0–10.5)
nRBC: 0 % (ref 0.0–0.2)

## 2024-02-04 LAB — TSH: TSH: 4.549 u[IU]/mL — ABNORMAL HIGH (ref 0.350–4.500)

## 2024-02-04 LAB — TROPONIN I (HIGH SENSITIVITY)
Troponin I (High Sensitivity): 55 ng/L — ABNORMAL HIGH (ref <18)
Troponin I (High Sensitivity): 54 ng/L — ABNORMAL HIGH (ref <18)

## 2024-02-04 LAB — MAGNESIUM: Magnesium: 1.8 mg/dL (ref 1.7–2.4)

## 2024-02-04 MED ORDER — OXYCODONE HCL 5 MG PO TABS: 5.0000 mg | ORAL_TABLET | ORAL | 0 refills | Status: AC | PRN

## 2024-02-04 MED ORDER — METHOCARBAMOL 500 MG PO TABS: 500.0000 mg | ORAL_TABLET | Freq: Four times a day (QID) | ORAL | 0 refills | Status: AC | PRN

## 2024-02-04 MED ORDER — ADENOSINE 6 MG/2ML IV SOLN
INTRAVENOUS | Status: AC
  Filled 2024-02-04: qty 2

## 2024-02-04 MED ORDER — SENNOSIDES-DOCUSATE SODIUM 8.6-50 MG PO TABS: 1.0000 | ORAL_TABLET | Freq: Two times a day (BID) | ORAL | 0 refills | Status: AC

## 2024-02-04 MED ORDER — ADENOSINE 6 MG/2ML IV SOLN
6.0000 mg | Freq: Once | INTRAVENOUS | Status: AC
  Administered 2024-02-04: 6 mg via INTRAVENOUS

## 2024-02-04 MED ORDER — ACETAMINOPHEN 325 MG PO TABS: 650.0000 mg | ORAL_TABLET | Freq: Four times a day (QID) | ORAL | 0 refills | Status: AC | PRN

## 2024-02-04 MED ORDER — DILTIAZEM HCL 25 MG/5ML IV SOLN
10.0000 mg | Freq: Once | INTRAVENOUS | Status: DC
  Filled 2024-02-04: qty 5

## 2024-02-04 NOTE — Progress Notes (Signed)
   02/04/24 0803  Assess: MEWS Score  BP 121/79  MAP (mmHg) 88  Pulse Rate (!) 146  Resp 20  Level of Consciousness Alert  SpO2 94 %  O2 Device Room Air  Assess: MEWS Score  MEWS Temp 0  MEWS Systolic 0  MEWS Pulse 3  MEWS RR 0  MEWS LOC 0  MEWS Score 3  MEWS Score Color Yellow  Assess: if the MEWS score is Yellow or Red  Were vital signs accurate and taken at a resting state? Yes  Does the patient meet 2 or more of the SIRS criteria? No  MEWS guidelines implemented  Yes, yellow  Treat  MEWS Interventions Considered administering scheduled or prn medications/treatments as ordered  Take Vital Signs  Increase Vital Sign Frequency  Yellow: Q2hr x1, continue Q4hrs until patient remains green for 12hrs  Escalate  MEWS: Escalate Yellow: Discuss with charge nurse and consider notifying provider and/or RRT  Notify: Charge Nurse/RN  Name of Charge Nurse/RN Notified Angelito RN Charge Nurse  Provider Notification  Provider Name/Title Beane MD  Date Provider Notified 02/04/24  Time Provider Notified [phone]  Method of Notification  (Secure Chat)  Notification Reason Other (Comment) (Tachycardia)  Provider response See new orders  Date of Provider Response 02/05/24  Time of Provider Response 0903  Assess: SIRS CRITERIA  SIRS Temperature  0  SIRS Respirations  0  SIRS Pulse 1  SIRS WBC 0  SIRS Score Sum  1

## 2024-02-04 NOTE — Progress Notes (Signed)
 Provider cancelled DC scheduled for today after tachycardic episode.

## 2024-02-04 NOTE — Assessment & Plan Note (Signed)
 GAD: Cont home ativan , zoloft , wellbutrin  HTN: Cont home lisinopril , amlodipine , bisoprolol  Gout: Cont home allopurinol 

## 2024-02-04 NOTE — TOC Transition Note (Signed)
 Transition of Care [facility]) - Discharge Note   Patient Details  Name: Francisco Cowan MRN: [id number] Date of Birth: [birth year]  Transition of Care [facility]) CM/SW Contact:  Robynn Eileen Hoose, RN Phone Number: 02/04/2024, 8:22 AM   Clinical Narrative:   Patient is being discharged today. Amy with [facility] made aware.    Final next level of care: Home w Home Health Services Barriers to Discharge: No Barriers Identified   Patient Goals and CMS Choice   CMS Medicare.gov Compare Post Acute Care list provided to:: Patient Choice offered to / list presented to : Patient      Discharge Placement                       Discharge Plan and Services Additional resources added to the After Visit Summary for                            [facility] Arranged: PT, OT [facility] Agency: [facility] Date [facility] Agency Contacted: 02/02/24   Representative spoke with at [facility] Agency: Adriana  Social Drivers of Health (SDOH) Interventions SDOH Screenings   Food Insecurity: No Food Insecurity (02/02/2024)  Housing: Low Risk  (02/02/2024)  Transportation Needs: No Transportation Needs (02/02/2024)  Utilities: Not At Risk (02/02/2024)  Alcohol Screen: Low Risk  (12/18/2023)  Depression (PHQ2-9): Low Risk  (12/19/2023)  Financial Resource Strain: Low Risk (12/18/2023)   Received from [facility]  Physical Activity: Insufficiently Active (12/18/2023)  Social Connections: Socially Isolated (02/02/2024)  Stress: No Stress Concern Present (12/18/2023)  Tobacco Use: Medium Risk (02/01/2024)  Health Literacy: Low Risk (12/18/2023)   Received from [facility]     Readmission Risk Interventions     No data to display

## 2024-02-04 NOTE — Significant Event (Signed)
 Rapid Response Event Note   Reason for Call :  Adenosine  ordered  Patient was sitting on BSC on arrival, returned to bed. Adenosine  obtained, zoll pads placed on patient and new IV placed as current one leaking. Patient denies flutters, palpitations, dizziness.  86/58 (68) HR 148 RR 20 O2 92% RA  [name] at bedside, 6mg  IV Adenosine  given. Patient converted, maintaining HR 95-105. Per MD, if pt returns SVT, orders to bolus with 150mg  IV Amio and transfer to PCU.  118/75 (89) HR 96 RR 22 O2 92% RA  Tonna Chiquita POUR, RN

## 2024-02-04 NOTE — Plan of Care (Signed)
 [facility] was consulted for need of evaluation of suspected SVT.  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  I have reached out to family medicine teaching service to notify them of the need of consultation as well as formally consulted cardiology to help facilitate timely care for this patient.

## 2024-02-04 NOTE — Plan of Care (Signed)
  Problem: Pain Managment: Goal: General experience of comfort will improve and/or be controlled Outcome: Progressing   Problem: Safety: Goal: Ability to remain free from injury will improve Outcome: Progressing   Problem: Skin Integrity: Goal: Risk for impaired skin integrity will decrease Outcome: Progressing

## 2024-02-04 NOTE — Assessment & Plan Note (Addendum)
 Now resolved s/p adenosine  cardioversion. Likely precipitated by recent lumbar surgery. TSH elevated, likely in setting of recent surgery rather than true hypothyroidism.  -  Per Cards, if SVT reoccurs, then give amiodarone  150 mg bolus IV transfer to telemetry unit. - Cardiology consulted, appreciate recs  - Repeat TSH outpt in 4 weeks

## 2024-02-04 NOTE — Progress Notes (Signed)
 Physical Therapy Treatment Patient Details Name: Francisco Cowan MRN: [id number] DOB: 12-Nov-1950 Today's Date: 02/04/2024   History of Present Illness 73 y.o. 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 Pt s/p XLIF L3-L4 with L2-L3 decompression and extension of PSFI L3-L5 12/4. PMHx: ETOH abuse, anxiety, Tcell lymphoma, HTN, neuropathy, GERD, and OSA on CPAP.    PT Comments  Pt greeted supine in bed, pleasant and agreeable to PT session. He continues to have difficulty recalling back precautions. Repeated education, sequencing for don/doff back, reviewed handout, and provided multi-modal cues throughout mobility to ensure adherence. Pt required CGA and increased time to complete supine>sit via the log roll technique. He increased gait distance ambulating ~181ft using RW. Pt demonstrated multiple gait abnormalities and required CGA for safety/stability. Will continue to follow acutely and advance appropriately.     If plan is discharge home, recommend the following: A little help with walking and/or transfers;A little help with bathing/dressing/bathroom;Assistance with cooking/housework;Assist for transportation;Help with stairs or ramp for entrance   Can travel by private vehicle        Equipment Recommendations  None recommended by PT (has all DME)    Recommendations for Other Services       Precautions / Restrictions Precautions Precautions: Back;Fall Precaution Booklet Issued: Yes (comment) Recall of Precautions/Restrictions: Impaired Precaution/Restrictions Comments: Pt recalled 1/3 back precautions at begining of session. Reporting to roll and avoid twisting. Re-educated pt on log roll technique and BLT: no bending, lifting, and twisting. Pt required ques to adhere to precautions throughout mobility. He was able to verbalize 2/3 at end of session. Encouraged him to look through handout to refresh his knowledge and  improve his understanding. Required Braces or Orthoses: Spinal Brace Spinal Brace: Lumbar corset;Applied in sitting position Restrictions Weight Bearing Restrictions Per Provider Order: No     Mobility  Bed Mobility Overal bed mobility: Needs Assistance Bed Mobility: Rolling, Sidelying to Sit Rolling: Contact guard assist, Used rails Sidelying to sit: Contact guard assist, HOB elevated, Used rails       General bed mobility comments: Reviewed log roll technique. Pt opted to get up on the R side of the bed. Cued pt to bend L knee or flex bilateral knees into hooklying position to help push himself over to lay on R side. Pt pulling on L bedrail holding himself back and appearing to twist. VC/TC to maintain netural straight back posture. Pt able to come into sidelying with increased time. He brought BLE off EOB and pushed up with BUE to elevate trunk.    Transfers Overall transfer level: Needs assistance Equipment used: Rolling walker (2 wheels) Transfers: Sit to/from Stand, Bed to chair/wheelchair/BSC Sit to Stand: Contact guard assist   Step pivot transfers: Contact guard assist       General transfer comment: Pt continues to demonstrate preference to place BUE support on RW. Cued pt on proper hand placement. He powered up with CGA. Transferred to recliner chair at end of session. Good eccentric control.    Ambulation/Gait Ambulation/Gait assistance: Contact guard assist Gait Distance (Feet): 115 Feet Assistive device: Rolling walker (2 wheels) Gait Pattern/deviations: Step-to pattern, Decreased step length - right, Decreased step length - left, Decreased stride length, Trunk flexed Gait velocity: decreased Gait velocity interpretation: <1.31 ft/sec, indicative of household ambulator   General Gait Details: Pt took short steps. He varied gait speed between slow to rapid. Cued pt to maintain steady even pace. He heavyily reliance on  BUE support on RW. Pt intermittently push AD  too far infront of him and would lift wheels off ground. Cued closer proximity to RW and to maintain all four points in contact with the ground at all times. VC/TC to improve upright posture.   Stairs             Wheelchair Mobility     Tilt Bed    Modified Rankin (Stroke Patients Only)       Balance Overall balance assessment: Needs assistance Sitting-balance support: Feet supported, Single extremity supported Sitting balance-Leahy Scale: Fair Sitting balance - Comments: Pt sat EOB with close supervision. He began to apply LSO once handed to him by PT with intermittent cues. Pt required assist to improve the fit. He tightened by twisting the buttons.   Standing balance support: Bilateral upper extremity supported, During functional activity, Reliant on assistive device for balance Standing balance-Leahy Scale: Poor Standing balance comment: Reliant on RW                            Communication Communication Communication: Impaired Factors Affecting Communication: Hearing impaired  Cognition Arousal: Alert Behavior During Therapy: Flat affect   PT - Cognitive impairments: Safety/Judgement, Sequencing, Problem solving, Memory                       PT - Cognition Comments: He has delayed processing. Poor safety awareness and  difficulty following back precautions despite multi-modal cues and repeated education. Following commands: Intact      Cueing Cueing Techniques: Verbal cues, Tactile cues, Gestural cues  Exercises      General Comments General comments (skin integrity, edema, etc.): Pt's HR was 93-104 bpm throughout session.      Pertinent Vitals/Pain Pain Assessment Pain Assessment: Faces Faces Pain Scale: Hurts little more Pain Location: Back Pain Descriptors / Indicators: Grimacing, Discomfort, Guarding Pain Intervention(s): Monitored during session, Limited activity within patient's tolerance, Repositioned    Home Living                           Prior Function            PT Goals (current goals can now be found in the care plan section) Acute Rehab PT Goals Patient Stated Goal: Have less pain and get back home PT Goal Formulation: With patient Time For Goal Achievement: 02/16/24 Potential to Achieve Goals: Fair Progress towards PT goals: Progressing toward goals    Frequency    Min 5X/week      PT Plan      Co-evaluation              AM-PAC PT 6 Clicks Mobility   Outcome Measure  Help needed turning from your back to your side while in a flat bed without using bedrails?: A Little Help needed moving from lying on your back to sitting on the side of a flat bed without using bedrails?: A Little Help needed moving to and from a bed to a chair (including a wheelchair)?: A Little Help needed standing up from a chair using your arms (e.g., wheelchair or bedside chair)?: A Little Help needed to walk in hospital room?: A Little Help needed climbing 3-5 steps with a railing? : A Lot 6 Click Score: 17    End of Session Equipment Utilized During Treatment: Back brace;Gait belt Activity Tolerance: Patient tolerated treatment well;Patient limited by pain  Patient left: in chair;with call bell/phone within reach;with chair alarm set Nurse Communication: Mobility status PT Visit Diagnosis: Difficulty in walking, not elsewhere classified (R26.2);Muscle weakness (generalized) (M62.81);Other abnormalities of gait and mobility (R26.89);Unsteadiness on feet (R26.81);Pain Pain - part of body:  (Back)     Time: 8394-8370 PT Time Calculation (min) (ACUTE ONLY): 24 min  Charges:    $Gait Training: 8-22 mins $Therapeutic Activity: 8-22 mins PT General Charges $$ ACUTE PT VISIT: 1 Visit                     Randall SAUNDERS, PT, DPT Acute Rehabilitation Services Office: [phone] Secure Chat Preferred  Delon CHRISTELLA Callander 02/04/2024, 5:14 PM

## 2024-02-04 NOTE — Consult Note (Addendum)
     Consult Note Service Pager: [phone]  Patient name: JACORIE ERNSBERGER Medical record number: [id number] Date of Birth: [birth year] Age: 73 y.o. Gender: male  Primary Care Provider: Lucie Pinal, DO Primary Team: Orthopedics Consults: FMTS, Cards Code Status: Full   Chief Complaint: SVT in setting of recent procedure  Differential and Medical Decision Making:  JAYCOB MCCLENTON is a 73 y.o. male w/ hx of lumbar stenosis status post lumbar fusion 12/4, HTN, GERD, GAD that was initially admitted for lumbar fusion procedure with Ortho 12/4, but hospital course complicated by SVT on 12/6.   Assessment & Plan SVT (supraventricular tachycardia) Now resolved s/p adenosine  cardioversion. Likely precipitated by recent lumbar surgery. TSH elevated, likely in setting of recent surgery rather than true hypothyroidism.  -  Per Cards, if SVT reoccurs, then give amiodarone  150 mg bolus IV transfer to telemetry unit. - Cardiology consulted, appreciate recs  - Repeat TSH outpt in 4 weeks S/P lumbar fusion Pain controlled. Per TOC note, plan to DC home with [facility].  Chronic health problem GAD: Cont home ativan , zoloft , wellbutrin  HTN: Cont home lisinopril , amlodipine , bisoprolol  Gout: Cont home allopurinol   FEN/GI: Regular VTE Prophylaxis: SCDs  Disposition: Med Surg  History of Present Illness:  JANATHAN BRIBIESCA is a 73 y.o. male presenting with SVT. Reports he was feeling noraml, when he suddenly developed palpitations yesterday. Denies SOB or chest pain. Denies hx of cardiac disease.  When evaluated him, he was s/p adenosine  cardioversion w/ Cardiology and was back to NSR.      Pertinent Past Medical History: HTN, GERD, GAD  Remainder reviewed in history tab.   Pertinent Past Surgical History: lumbar stenosis status post lumbar fusion 12/4  Rotator cuff repain Carpal tunnel release BL  Remainder reviewed in history tab.   Important Outpatient  Medications: Allopurinol  Amlodipine  Bisoprolol  Bupropion  Celecoxib  Lisinopril  10 mg daily Ativan  1 mg Q8 as needed Protonix  40 daily Zoloft  100 Tamsulosin  0.4 mg daily Trazodone  nightly as needed   Objective: BP 124/75   Pulse 91   Temp 98.2 F (36.8 C)   Resp (!) 26   Ht 5' 8 (1.727 m)   Wt 90.7 kg   SpO2 95%   BMI 30.41 kg/m  Exam: General: Sleeping, awoke easily to voice.  Laying comfortably in bed, speaking comfortably in full sentences on room air. HEENT: NCAT. MMM. Cardiovascular: RRR. No murmurs.  Respiratory: CTAB. Normal WOB on RA Gastrointestinal: Soft, nontender, nondistended.    Labs:  CBC BMET  Recent Labs  Lab 02/04/24 0912  WBC 11.4*  HGB 12.5*  HCT 36.2*  PLT 130*   Recent Labs  Lab 02/04/24 0912  NA 136  K 3.7  CL 100  CO2 25  BUN 16  CREATININE 1.00  GLUCOSE 110*  CALCIUM 8.5DEWAINE Elicia Hamlet, MD 02/04/2024, 12:22 PM PGY-3, [facility]  FPTS Intern pager: [phone], text pages welcome Secure chat group [facility] Teaching Service

## 2024-02-04 NOTE — Assessment & Plan Note (Signed)
 Pain controlled. Per TOC note, plan to DC home with [facility].

## 2024-02-04 NOTE — Consult Note (Signed)
 CARDIOLOGY CONSULT NOTE       Patient ID: Francisco Cowan MRN: [id number] DOB/AGE: [birth year] 73 y.o.  Admit date: 02/01/2024 Referring Physician: Claudene Primary Physician: Cleotilde Lukes, DO Primary Cardiologist: None  Reason for Consultation: SVT  Principal Problem:   S/P lumbar fusion   HPI:  73 y.o. admitted for lumbar fusion with Dr Burnetta. Noted to have rapid pulse this am. Baseline ECG with RBBB but no WPW. ECG in tachycardia shows SVT with retrograde P waves. He has no cardiac history History of ETOH abuse and THC use. Has OSA with CPAP and HTN>  Post op pain control moderate VSS. He does not notice his rapid HR but is going at about 150 bpm. No chest pain dyspnea or lightheadedness.   ROS All other systems reviewed and negative except as noted above  Past Medical History:  Diagnosis Date   Abnormal ECG 09/19/2017   NSR. Left axis deviation.    Alcohol abuse    Anxiety    Cutaneous T-cell lymphoma (HCC)    Depression    ED (erectile dysfunction)    GERD (gastroesophageal reflux disease)    History of kidney stones    Hypertension    Moderate tetrahydrocannabinol (THC) dependence (HCC)    Neuropathy    arms and legs   Photodermatitis    Sleep apnea    uses CPAP   Spinal stenosis    Urge incontinence     Family History  Problem Relation Age of Onset   Bipolar disorder Mother    Arthritis Mother    Depression Mother    Stroke Mother    Angina Father    Hypertension Father    Stroke Father    Lung cancer Brother    COPD Brother    Hearing loss Brother    Colon cancer Paternal Aunt    Bone cancer Paternal Uncle     Social History   Socioeconomic History   Marital status: Married    Spouse name: Not on file   Number of children: 2   Years of education: Not on file   Highest education level: Some college, no degree  Occupational History   Occupation: radio broadcast assistant  Tobacco Use   Smoking status: Former    Current packs/day: 0.00     Types: Cigarettes    Start date: 1971    Quit date: 2002    Years since quitting: 23.9   Smokeless tobacco: Never  Vaping Use   Vaping status: Never Used  Substance and Sexual Activity   Alcohol use: Not Currently    Alcohol/week: 7.0 - 14.0 standard drinks of alcohol    Types: 7 - 14 Standard drinks or equivalent per week    Comment: beer or glass of wine 1-2 drinks per day   Drug use: Yes    Frequency: 7.0 times per week    Types: Marijuana, Benzodiazepines    Comment: smokes marijuana daily- has prescription in [place] , pt has prescription for Ativan    Sexual activity: Yes    Partners: Female  Other Topics Concern   Not on file  Social History Narrative   Marital status/children/pets: married. Lives in [place], [place] 6 months out of the year (usually leaves in November yearly to [place] ).   Education/employment: Some college, radio broadcast assistant.    Daily ETOH and THC use ( on records 02/2017).    Safety:      -smoke alarm in the home:Yes     - wears seatbelt: Yes     -  Feels safe in their relationships: Yes               Right handed   Wear glasses    Drink 1-2 cups per day       Social Drivers of Health   Financial Resource Strain: Low Risk (12/18/2023)   Received from [facility]   Overall Financial Resource Strain (CARDIA)    How hard is it for you to pay for the very basics like food, housing, medical care, and heating?: Not very hard  Food Insecurity: No Food Insecurity (02/02/2024)   Hunger Vital Sign    Worried About Running Out of Food in the Last Year: Never true    Ran Out of Food in the Last Year: Never true  Transportation Needs: No Transportation Needs (02/02/2024)   PRAPARE - Administrator, Civil Service (Medical): No    Lack of Transportation (Non-Medical): No  Physical Activity: Insufficiently Active (12/18/2023)   Exercise Vital Sign    Days of Exercise per Week: 2 days    Minutes of Exercise per Session: 20 min  Stress: No  Stress Concern Present (12/18/2023)   [employer] of Occupational Health - Occupational Stress Questionnaire    Feeling of Stress: Not at all  Social Connections: Socially Isolated (02/02/2024)   Social Connection and Isolation Panel    Frequency of Communication with Friends and Family: Never    Frequency of Social Gatherings with Friends and Family: Never    Attends Religious Services: Never    Database Administrator or Organizations: No    Attends Banker Meetings: Never    Marital Status: Married  Catering Manager Violence: Not At Risk (02/02/2024)   Humiliation, Afraid, Rape, and Kick questionnaire    Fear of Current or Ex-Partner: No    Emotionally Abused: No    Physically Abused: No    Sexually Abused: No    Past Surgical History:  Procedure Laterality Date   ANTERIOR LAT LUMBAR FUSION N/A 02/01/2024   Procedure: EXTTEME LATERAL INTERBODY FUSION OF LUMBAR THREE TO LUMBAR FOUR WITH POSTERIOR SUPPLEMENTAL FUSION INSTRUMENTED OF LUMBAR THREE TO LUMBAR FOUR AND LUMBAR FOUR AND LUMBAR FIVE WITH LUMBAR DECOMPRESSION AT LUMBAR TWO TO LUMBAR THREE;  Surgeon: Burnetta Aures, MD;  Location: [facility] OR;  Service: Orthopedics;  Laterality: N/A;  Extreme lateral interbody fusion  L3-4, posterior spinal fusion  L3-   CARPAL TUNNEL RELEASE Bilateral 1991   COLONOSCOPY     CYST REMOVAL TRUNK N/A 07/2014   back   ESOPHAGOGASTRODUODENOSCOPY     ROTATOR CUFF REPAIR Left 2003/2018   ROTATOR CUFF REPAIR Right 02/10/2014   Study: Echo  06/27/2016   LV size normal.  LV wall thickness normal.  LVEF 55-60%.  Diastolic filling pattern indicates impaired relaxation.  GLS test 17%.  LA is mildly dilated.  Moderate AV sclerosis without stenosis.  Trace MR.  RVSP 22.25 mmHg p   TRANSFORAMINAL LUMBAR INTERBODY FUSION (TLIF) WITH PEDICLE SCREW FIXATION 1 LEVEL N/A 11/22/2017   Procedure: TRANSFORAMINAL LUMBAR INTERBODY FUSION Lumbar four-five;  Surgeon: Burnetta Aures, MD;  Location: [facility] OR;   Service: Orthopedics;  Laterality: N/A;  4.5 hrs   ULNAR NERVE TRANSPOSITION Left 2018      Current Facility-Administered Medications:    acetaminophen  (TYLENOL ) tablet 650 mg, 650 mg, Oral, Q4H PRN **OR** acetaminophen  (TYLENOL ) suppository 650 mg, 650 mg, Rectal, Q4H PRN, Burnetta Aures, MD   adenosine  (ADENOCARD ) 6 MG/2ML injection, , , ,  allopurinol  (ZYLOPRIM ) tablet 150 mg, 150 mg, Oral, QHS, Burnetta Aures, MD, 150 mg at 02/03/24 2124   amLODipine  (NORVASC ) tablet 2.5 mg, 2.5 mg, Oral, QHS, Burnetta Aures, MD, 2.5 mg at 02/03/24 2124   bisoprolol  (ZEBETA ) tablet 5 mg, 5 mg, Oral, QHS, Burnetta Aures, MD, 5 mg at 02/03/24 2125   buPROPion  (WELLBUTRIN  XL) 24 hr tablet 450 mg, 450 mg, Oral, Daily, Burnetta Aures, MD, 450 mg at 02/03/24 1026   diltiazem  (CARDIZEM ) injection 10 mg, 10 mg, Intravenous, Once, Claudene Maximino LABOR, MD   lactated ringers  infusion, , Intravenous, Continuous, Burnetta Aures, MD, Last Rate: 85 mL/hr at 02/03/24 1559, Infusion Verify at 02/03/24 1559   lisinopril  (ZESTRIL ) tablet 10 mg, 10 mg, Oral, Daily, Burnetta Aures, MD, 10 mg at 02/03/24 1026   LORazepam  (ATIVAN ) tablet 1 mg, 1 mg, Oral, Q8H PRN, Burnetta Aures, MD, 1 mg at 02/03/24 2124   menthol  (CEPACOL) lozenge 3 mg, 1 lozenge, Oral, PRN **OR** phenol (CHLORASEPTIC) mouth spray 1 spray, 1 spray, Mouth/Throat, PRN, Burnetta Aures, MD   methocarbamol  (ROBAXIN ) tablet 500 mg, 500 mg, Oral, Q6H PRN, 500 mg at 02/04/24 0459 **OR** methocarbamol  (ROBAXIN ) injection 500 mg, 500 mg, Intravenous, Q6H PRN, Burnetta Aures, MD   ondansetron  (ZOFRAN ) tablet 4 mg, 4 mg, Oral, Q6H PRN **OR** ondansetron  (ZOFRAN ) injection 4 mg, 4 mg, Intravenous, Q6H PRN, Burnetta Aures, MD   oxyCODONE  (Oxy IR/ROXICODONE ) immediate release tablet 10 mg, 10 mg, Oral, Q3H PRN, Burnetta Aures, MD, 10 mg at 02/04/24 0459   oxyCODONE  (Oxy IR/ROXICODONE ) immediate release tablet 5 mg, 5 mg, Oral, Q3H PRN, Burnetta Aures, MD, 5 mg at 02/03/24 1032    pantoprazole  (PROTONIX ) EC tablet 40 mg, 40 mg, Oral, QHS, Burnetta Aures, MD, 40 mg at 02/03/24 2124   polyethylene glycol (MIRALAX  / GLYCOLAX ) packet 17 g, 17 g, Oral, Daily PRN, Burnetta Aures, MD   senna-docusate (Senokot-S) tablet 1 tablet, 1 tablet, Oral, BID, Burnetta Aures, MD, 1 tablet at 02/02/24 2101   sertraline  (ZOLOFT ) tablet 100 mg, 100 mg, Oral, Daily, Burnetta Aures, MD, 100 mg at 02/03/24 1025   sodium chloride  flush (NS) 0.9 % injection 3 mL, 3 mL, Intravenous, Q12H, Burnetta Aures, MD, 3 mL at 02/03/24 2127   sodium chloride  flush (NS) 0.9 % injection 3 mL, 3 mL, Intravenous, PRN, Burnetta Aures, MD   tamsulosin  (FLOMAX ) capsule 0.4 mg, 0.4 mg, Oral, Daily, Burnetta Aures, MD, 0.4 mg at 02/03/24 1024  adenosine        allopurinol   150 mg Oral QHS   amLODipine   2.5 mg Oral QHS   bisoprolol   5 mg Oral QHS   buPROPion   450 mg Oral Daily   diltiazem   10 mg Intravenous Once   lisinopril   10 mg Oral Daily   pantoprazole   40 mg Oral QHS   senna-docusate  1 tablet Oral BID   sertraline   100 mg Oral Daily   sodium chloride  flush  3 mL Intravenous Q12H   tamsulosin   0.4 mg Oral Daily    lactated ringers  85 mL/hr at 02/03/24 1559    Physical Exam:   BP (P) 122/76   Pulse (P) 96   Temp 98.2 F (36.8 C)   Resp (!) 22   Ht 5' 8 (1.727 m)   Wt 90.7 kg   SpO2 96%   BMI 30.41 kg/m   Affect appropriate Chronically ill male  HEENT: normal Neck supple with no adenopathy JVP normal no bruits no thyromegaly Lungs clear with no wheezing and  good diaphragmatic motion Heart:  S1/S2 no murmur, no rub, gallop or click PMI normal Abdomen: benighn, BS positve, no tenderness, no AAA no bruit.  No HSM or HJR Distal pulses intact with no bruits No edema Neuro non-focal  recent lumbar surgery  Skin warm and dry No muscular weakness   Labs:   Lab Results  Component Value Date   WBC 11.4 (H) 02/04/2024   HGB 12.5 (L) 02/04/2024   HCT 36.2 (L) 02/04/2024   MCV 92.8  02/04/2024   PLT 130 (L) 02/04/2024   No results for input(s): NA, K, CL, CO2, BUN, CREATININE, CALCIUM, PROT, BILITOT, ALKPHOS, ALT, AST, GLUCOSE in the last 168 hours.  Invalid input(s): LABALBU No results found for: CKTOTAL, CKMB, CKMBINDEX, TROPONINI No results found for: CHOL No results found for: HDL No results found for: LDLCALC No results found for: TRIG No results found for: CHOLHDL No results found for: LDLDIRECT    Radiology: DG Lumbar Spine 2-3 Views Result Date: 02/01/2024 CLINICAL DATA:  Elective surgery EXAM: LUMBAR SPINE - 2-3 VIEW COMPARISON:  None Available. FINDINGS: Sixteen fluoroscopic spot views of the lumbar spine submitted from the operating room. Image 1 demonstrates posterior fusion hardware at L4-L5 with interbody spacer. Subsequent interbody spacer at L3-L4 with extension of posterior hardware to the L3-L4 level. Fluoroscopy time 3 minutes 34 seconds. Dose 194.88 mGy. IMPRESSION: Intraoperative fluoroscopy during lumbar fusion. Electronically Signed   By: Andrea Gasman M.D.   On: 02/01/2024 14:54   DG C-Arm 1-60 Min-No Report Result Date: 02/01/2024 Fluoroscopy was utilized by the requesting physician.  No radiographic interpretation.   DG C-Arm 1-60 Min-No Report Result Date: 02/01/2024 Fluoroscopy was utilized by the requesting physician.  No radiographic interpretation.   DG C-Arm 1-60 Min-No Report Result Date: 02/01/2024 Fluoroscopy was utilized by the requesting physician.  No radiographic interpretation.   DG C-Arm 1-60 Min-No Report Result Date: 02/01/2024 Fluoroscopy was utilized by the requesting physician.  No radiographic interpretation.   DG C-Arm 1-60 Min-No Report Result Date: 02/01/2024 Fluoroscopy was utilized by the requesting physician.  No radiographic interpretation.    EKG: SR RBBB at baseline no WPW.  In tachycardia RBBB with retrograde P waves SVT rate 148   ASSESSMENT AND PLAN:    SVT:  will call rapid response team Needs iv as current one leaking Discussed use of CSM and adenosine  to break arrhythmia Patient in agreement. Need to get defibrillator to monitor patient See procedure note to follow HTN:  d/c norvasc  increase zebeta  to 10 mg  Ortho:  wounds look good pain control per Dr Burnetta He will not be going home needs telemetry for 24 hours   Signed: Maude Emmer 02/04/2024, 10:42 AM

## 2024-02-04 NOTE — Progress Notes (Signed)
 Procedure note: Chemical cardioversion Risk of bradycardia, intubation, shock Rx discussed patient willing to proceed Brought defibrillator to bedside since patient not on telemetry New iv started left arm as only iv present was leaking   Initially attempted CSM with valsalva on both sides with no slowing of HR Adenosine  given 6 mg rapid push with saline bolus chaser  Patient converted to NSR rate 103 from SVT at rate 150 Seems to be maintaining NSR  Discussed with rapid response If SVT recurs would bolus with 150 mg of amiodarone  And transfer to telemetry unit  Maude Emmer MD [facility]

## 2024-02-04 NOTE — Progress Notes (Signed)
 Subjective: 3 Days Post-Op Procedure(s) (LRB): EXTTEME LATERAL INTERBODY FUSION OF LUMBAR THREE TO LUMBAR FOUR WITH POSTERIOR SUPPLEMENTAL FUSION INSTRUMENTED OF LUMBAR THREE TO LUMBAR FOUR AND LUMBAR FOUR AND LUMBAR FIVE WITH LUMBAR DECOMPRESSION AT LUMBAR TWO TO LUMBAR THREE (N/A) POSTERIOR LUMBAR FUSION 2 LEVEL (N/A) Patient reports pain as mild.    Objective: Vital signs in last 24 hours: Temp:  [97.4 F (36.3 C)-98.9 F (37.2 C)] 98.2 F (36.8 C) (12/07 0751) Pulse Rate:  [85-149] 91 (12/07 1109) Resp:  [15-26] 26 (12/07 1045) BP: (79-137)/(58-110) 124/75 (12/07 1109) SpO2:  [91 %-96 %] 95 % (12/07 1109)  Intake/Output from previous day: 12/06 0701 - 12/07 0700 In: 365 [P.O.:360; I.V.:5] Out: -  Intake/Output this shift: No intake/output data recorded.  Recent Labs    02/04/24 0912  HGB 12.5*   Recent Labs    02/04/24 0912  WBC 11.4*  RBC 3.90*  HCT 36.2*  PLT 130*   Recent Labs    02/04/24 0912  NA 136  K 3.7  CL 100  CO2 25  BUN 16  CREATININE 1.00  GLUCOSE 110*  CALCIUM 8.5*   No results for input(s): LABPT, INR in the last 72 hours.  Neurologically intact ABD soft Neurovascular intact Sensation intact distally Intact pulses distally Dorsiflexion/Plantar flexion intact Incision: dressing C/D/I and scant drainage No cellulitis present Compartment soft No sign of DVT   Assessment/Plan: 3 Days Post-Op Procedure(s) (LRB): EXTTEME LATERAL INTERBODY FUSION OF LUMBAR THREE TO LUMBAR FOUR WITH POSTERIOR SUPPLEMENTAL FUSION INSTRUMENTED OF LUMBAR THREE TO LUMBAR FOUR AND LUMBAR FOUR AND LUMBAR FIVE WITH LUMBAR DECOMPRESSION AT LUMBAR TWO TO LUMBAR THREE (N/A) POSTERIOR LUMBAR FUSION 2 LEVEL (N/A) Advance diet Up with therapy Tachycardic this AM, cardiac consult placed already by Dr Duwayne   Francisco Cowan 02/04/2024, 11:37 AM

## 2024-02-05 ENCOUNTER — Inpatient Hospital Stay (HOSPITAL_COMMUNITY)

## 2024-02-05 MED ORDER — BISOPROLOL FUMARATE 10 MG PO TABS
10.0000 mg | ORAL_TABLET | Freq: Every day | ORAL | Status: DC
  Administered 2024-02-05: 10 mg via ORAL
  Filled 2024-02-05: qty 1

## 2024-02-05 MED ORDER — BISOPROLOL FUMARATE 10 MG PO TABS: 10.0000 mg | ORAL_TABLET | Freq: Every day | ORAL | 0 refills | Status: AC

## 2024-02-05 NOTE — Progress Notes (Signed)
  Cardiology Progress Note  Patient ID: Francisco Cowan MRN: [id number] DOB: 07-03-1950 Date of Encounter: 02/05/2024 Primary Cardiologist: None  Subjective   Chief Complaint: None.   HPI: Brief SVT overnight. No chest pain.  ROS:  All other ROS reviewed and negative. Pertinent positives noted in the HPI.     Telemetry  Overnight telemetry shows SR, which I personally reviewed.   ECG  The most recent ECG shows SVT heart rate 146, right bundle branch block, which I personally reviewed.   Physical Exam   Vitals:   02/04/24 1515 02/04/24 1944 02/05/24 0412 02/05/24 0717  BP: (!) 150/89 (!) 143/82 (!) 144/84 (!) 140/86  Pulse: 86 85 79 80  Resp: 16 17 18 16   Temp: 98.2 F (36.8 C) 98.4 F (36.9 C) 97.7 F (36.5 C) 98.4 F (36.9 C)  TempSrc:  Oral Oral   SpO2: 97% 95% 94% 98%  Weight:      Height:        Intake/Output Summary (Last 24 hours) at 02/05/2024 0915 Last data filed at 02/05/2024 0100 Gross per 24 hour  Intake --  Output 400 ml  Net -400 ml       02/01/2024    6:39 AM 01/23/2024   10:56 AM 12/19/2023    8:28 AM  Last 3 Weights  Weight (lbs) 200 lb 203 lb 4.8 oz 204 lb  Weight (kg) 90.719 kg 92.216 kg 92.534 kg    Body mass index is 30.41 kg/m.  General: Well nourished, well developed, in no acute distress Head: Atraumatic, normal size  Eyes: PEERLA, EOMI  Neck: Supple, no JVD Endocrine: No thryomegaly Cardiac: Normal S1, S2; RRR; no murmurs, rubs, or gallops Lungs: Clear to auscultation bilaterally, no wheezing, rhonchi or rales  Abd: Soft, nontender, no hepatomegaly  Ext: No edema, pulses 2+ Musculoskeletal: No deformities, BUE and BLE strength normal and equal Skin: Warm and dry, no rashes   Neuro: Alert and oriented to person, place, time, and situation, CNII-XII grossly intact, no focal deficits  Psych: Normal mood and affect   Cardiac Studies  None  Patient Profile  Francisco Cowan is a 73 y.o. male with hypertension, GERD admitted  on 02/01/2024 for lumbar spinal fusion surgery.  Cardiology consulted on 02/04/2024 for SVT that responded to adenosine .  Assessment & Plan   # SVT - Occurred yesterday.  Responded to adenosine .  Likely related to recent surgery. - Would recommend to increase bisoprolol  to 10 mg daily. - All other home medications can be continued. - We will arrange outpatient follow-up with [name]  # Elevated troponin, demand - No chest pain reported.  Can pursue outpatient echocardiogram.  I do not think he needs to stay here for this.  # Hypertension - Increase bisoprolol  to 10 mg daily.  Continue amlodipine  2.5 mg daily.  [name] will sign off.   The patient is ready for discharge today from a cardiac standpoint. Medication Recommendations: As above Other recommendations (labs, testing, etc): None Follow up as an outpatient: 4 to 6 weeks with [name]  For questions or updates, please contact [name] Please consult [email or web address] for contact info under        Signed, [name], MD, [facility]  [facility]  02/05/2024 9:15 AM

## 2024-02-05 NOTE — Assessment & Plan Note (Addendum)
 Now resolved s/p adenosine  cardioversion. Likely precipitated by recent lumbar surgery.  Had 1 brief nonsustained run of SVT overnight, resolved before medication could be administered. -  Per Cards, if SVT reoccurs, then give amiodarone  150 mg bolus IV transfer to telemetry unit.  - Increase bisoprolol  to 10mg  qhs - Recommend transfer to the telemetry unit for closer ops - Cardiology consulted, appreciate recs  - Repeat TSH outpt in 4 weeks

## 2024-02-05 NOTE — Care Management Important Message (Signed)
 Important Message  Patient Details  Name: Francisco Cowan MRN: [id number] Date of Birth: 1950-07-23   Important Message Given:  Yes - Medicare IM     Jennie Laneta Dragon 02/05/2024, 12:37 PM

## 2024-02-05 NOTE — Assessment & Plan Note (Signed)
 Pain controlled. Per TOC note, plan to DC home with [facility].

## 2024-02-05 NOTE — Progress Notes (Signed)
 Occupational Therapy Treatment Patient Details Name: Francisco Cowan MRN: [id number] DOB: 1950-03-21 Today's Date: 02/05/2024   History of present illness 73 y.o. 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 Pt s/p XLIF L3-L4 with L2-L3 decompression and extension of PSFI L3-L5 12/4. Rapid Response called 12/7 d/t SVT of 148bpm. Chemical cardioversion via 6mg  IV Adenosine . Pt converted to NSR. PMHx: ETOH abuse, anxiety, Tcell lymphoma, HTN, neuropathy, GERD, and OSA on CPAP.   OT comments  Pt is progressing well towards OT established goals. Focus of session on increasing activity tolerance with functional mobility and increasing independence with ADL tasks. Reinforced education of back precautions and functional implications of precautions on ADL tasks in the home environment. Pt required CGA for functional transfers and up to Mod A for ADL tasks this session. Pt continues to benefit from skilled OT services in acute care. Continue per POC.       If plan is discharge home, recommend the following:  A little help with walking and/or transfers;A little help with bathing/dressing/bathroom;Assist for transportation;Assistance with cooking/housework   Equipment Recommendations  BSC/3in1    Recommendations for Other Services      Precautions / Restrictions Precautions Precautions: Back;Fall Precaution Booklet Issued: Yes (comment) Recall of Precautions/Restrictions: Impaired Precaution/Restrictions Comments: Pt recalled 3/3 back precautions at beginning of session as PT had just gone over them with Pt. Throughout session, Pt required verbal cues for safety to adhere to precautions. Required Braces or Orthoses: Spinal Brace Spinal Brace: Lumbar corset;Applied in sitting position Restrictions Weight Bearing Restrictions Per Provider Order: No       Mobility Bed Mobility Overal bed mobility: Needs Assistance Bed Mobility:  Sit to Supine       Sit to supine: Mod assist   General bed mobility comments: Mod A log roll back to bed. Pt able to raise BLE onto bed but required Mod A to repositon.    Transfers Overall transfer level: Needs assistance Equipment used: Rolling walker (2 wheels) Transfers: Sit to/from Stand Sit to Stand: Contact guard assist, Min assist           General transfer comment: Pt initially Min A to rise from recliner but following two sit to stand transfers required CGA. Pt able to ambulate to commode and back to bed with CGA. Continues to require cues for proper hand placement on RW and grab bar     Balance Overall balance assessment: Needs assistance Sitting-balance support: Feet supported, No upper extremity supported Sitting balance-Leahy Scale: Fair Sitting balance - Comments: Pt able to sit EOB and on chair with close supervision   Standing balance support: During functional activity, Reliant on assistive device for balance, Bilateral upper extremity supported Standing balance-Leahy Scale: Poor Standing balance comment: Dependent on RW and external support                           ADL either performed or assessed with clinical judgement   ADL Overall ADL's : Needs assistance/impaired     Grooming: Wash/dry hands;Brushing hair;Minimal assistance;Sitting;Standing Grooming Details (indicate cue type and reason): Pt initially standing to complete grooming tasks but increased fatigue and unsteadiness on feet ultimately transitioning to sitting for tasks. Pt required verbal cues to adhere to back precautions, Min A required to complete grooming tasks in sitting.             Lower Body Dressing: Moderate assistance Lower Body Dressing Details (  indicate cue type and reason): Mod A to don/doff socks Toilet Transfer: Contact guard assist;Cueing for sequencing;Ambulation;Regular Toilet;Grab bars Toilet Transfer Details (indicate cue type and reason): CGA for  toilet transfer, verbal cue to utilize grab bar q                Extremity/Trunk Assessment Upper Extremity Assessment Upper Extremity Assessment: Generalized weakness            Vision       Perception     Praxis     Communication Communication Communication: Impaired Factors Affecting Communication: Hearing impaired (Pt states that he does not wear hearing aids in hospital because they fall out)   Cognition Arousal: Alert Behavior During Therapy: [facility] for tasks assessed/performed Cognition: Cognition impaired     Awareness: Online awareness impaired Memory impairment (select all impairments): Working civil service fast streamer, Emergency Planning/management Officer functioning impairment (select all impairments): Problem solving, Sequencing, Reasoning                   Following commands: Intact        Cueing   Cueing Techniques: Verbal cues, Gestural cues  Exercises      Shoulder Instructions       General Comments Reinforced education on back precautions and implication of precautions on ADL tasks in home environment    Pertinent Vitals/ Pain       Pain Assessment Pain Assessment: 0-10 Pain Score: 6  Pain Location: lower back Pain Descriptors / Indicators:  (irritating) Pain Intervention(s): Limited activity within patient's tolerance, Monitored during session, Repositioned  Home Living                                          Prior Functioning/Environment              Frequency  Min 2X/week        Progress Toward Goals  OT Goals(current goals can now be found in the care plan section)  Progress towards OT goals: Progressing toward goals  Acute Rehab OT Goals Patient Stated Goal: To get in bed OT Goal Formulation: With patient Time For Goal Achievement: 02/16/24 Potential to Achieve Goals: Good ADL Goals Pt Will Perform Grooming: with modified independence Pt Will Perform Lower Body Dressing: with modified independence;sit  to/from stand Pt Will Transfer to Toilet: with modified independence;ambulating;regular height toilet Pt Will Perform Toileting - Clothing Manipulation and hygiene: with modified independence;sit to/from stand Additional ADL Goal #1: pt will recall 3/3 back precautions with <2 verbal cues as precurso for safety with ADLs  Plan      Co-evaluation                 AM-PAC OT 6 Clicks Daily Activity     Outcome Measure   Help from another person eating meals?: None Help from another person taking care of personal grooming?: A Little Help from another person toileting, which includes using toliet, bedpan, or urinal?: A Lot Help from another person bathing (including washing, rinsing, drying)?: A Lot Help from another person to put on and taking off regular upper body clothing?: A Little Help from another person to put on and taking off regular lower body clothing?: A Lot 6 Click Score: 16    End of Session Equipment Utilized During Treatment: Back brace  OT Visit Diagnosis: Unsteadiness on feet (R26.81);Other abnormalities of gait and mobility (R26.89);Muscle  weakness (generalized) (M62.81)   Activity Tolerance Patient tolerated treatment well   Patient Left in bed;with call bell/phone within reach   Nurse Communication          Time: 8590-8566 OT Time Calculation (min): 24 min  Charges: OT General Charges $OT Visit: 1 Visit OT Treatments $Self Care/Home Management : 23-37 mins  Maurilio CROME, OTR/L.  [facility] Acute Rehabilitation  Office: [phone]   Maurilio PARAS Allysia Ingles 02/05/2024, 2:54 PM

## 2024-02-05 NOTE — Progress Notes (Signed)
     Daily Progress Note Intern Pager: [phone]  Patient name: Francisco Cowan Medical record number: [id number] Date of birth: [birth year] Age: 73 y.o. Gender: male  Primary Care Provider: Cleotilde Lukes, DO Primary Team: Orthopedics Consults: FMTS, Cards Code Status: Full    Chief Complaint: SVT in setting of recent procedure   Differential and Medical Decision Making:  Francisco Cowan is a 73 y.o. male w/ hx of lumbar stenosis status post lumbar fusion 12/4, HTN, GERD, GAD that was initially admitted for lumbar fusion procedure with Ortho 12/4, but hospital course complicated by SVT on 12/6.  Had nonsustained run of SVT 26 beats overnight, was asymptomatic, otherwise doing well. From a medical standpoint, is clear for discharge once cardiology signs off. Assessment & Plan SVT (supraventricular tachycardia) Now resolved s/p adenosine  cardioversion. Likely precipitated by recent lumbar surgery.  Had 1 brief nonsustained run of SVT overnight, resolved before medication could be administered. -  Per Cards, if SVT reoccurs, then give amiodarone  150 mg bolus IV transfer to telemetry unit.  - Increase bisoprolol  to 10mg  qhs - Recommend transfer to the telemetry unit for closer ops - Cardiology consulted, appreciate recs  - Repeat TSH outpt in 4 weeks S/P lumbar fusion Pain controlled. Per TOC note, plan to DC home with [facility].  Chronic health problem GAD: Cont home ativan , zoloft , wellbutrin  HTN: Cont home lisinopril , amlodipine , bisoprolol  Gout: Cont home allopurinol    FEN/GI: Regular PPx: SCDs  Subjective:  Had 1 brief run of nonsustained SVT for 26 beats overnight.  Denies any chest pain, shortness of breath, lightheadedness or dizziness.  Was having difficulty starting urine stream this morning, but reports he has no difficulties voiding, starting streams, or with dribbling. Reports he had two BM's over last 24 hours and is ambulating well, pain is tolerable. Eating and  drinking with no N/V.  Objective: Temp:  [97.7 F (36.5 C)-98.4 F (36.9 C)] 98.4 F (36.9 C) (12/08 0717) Pulse Rate:  [79-149] 80 (12/08 0717) Resp:  [15-26] 16 (12/08 0717) BP: (79-150)/(58-110) 140/86 (12/08 0717) SpO2:  [91 %-98 %] 98 % (12/08 0717) Physical Exam: General: Resting comfortably in no acute distress  cardiovascular: Regular rate rhythm, normal S1 and S2, no murmurs rubs or gallops Respiratory: Clear to auscultation bilaterally, no wheezes or crackles Abdomen: Active bowel sounds, nondistended, nontender Extremities: Nonedematous, nontender   Laboratory: Most recent CBC Lab Results  Component Value Date   WBC 11.4 (H) 02/04/2024   HGB 12.5 (L) 02/04/2024   HCT 36.2 (L) 02/04/2024   MCV 92.8 02/04/2024   PLT 130 (L) 02/04/2024   Most recent BMP    Latest Ref Rng & Units 02/04/2024    9:12 AM  BMP  Glucose 70 - 99 mg/dL 889   BUN 8 - 23 mg/dL 16   Creatinine 9.38 - 1.24 mg/dL 8.99   Sodium 864 - 854 mmol/L 136   Potassium 3.5 - 5.1 mmol/L 3.7   Chloride 98 - 111 mmol/L 100   CO2 22 - 32 mmol/L 25   Calcium 8.9 - 10.3 mg/dL 8.5     Imaging/Diagnostic Tests: No new imaging  Lorrane Pac, MD 02/05/2024, 8:33 AM  PGY-1, [facility] FPTS Intern pager: [phone], text pages welcome Secure chat group [facility] Teaching Service

## 2024-02-05 NOTE — Progress Notes (Signed)
 Physical Therapy Treatment Patient Details Name: Francisco Cowan MRN: [id number] DOB: 1951-01-30 Today's Date: 02/05/2024   History of Present Illness 73 y.o. 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 Pt s/p XLIF L3-L4 with L2-L3 decompression and extension of PSFI L3-L5 12/4. Rapid Response called 12/7 d/t SVT of 148bpm. Chemical cardioversion via 6mg  IV Adenosine . Pt converted to NSR. PMHx: ETOH abuse, anxiety, Tcell lymphoma, HTN, neuropathy, GERD, and OSA on CPAP.    PT Comments  Pt received up in chair and agreeable to participate in physical therapy session. Recalls 1/3 spinal precautions and needs reminders for adjusting/tightening lumbar brace. Pt with improving activity tolerance this session, ambulating 170 ft with a RW with ~2 short standing rest breaks. HR 92-100 bpm. Pt continues to demonstrate impaired standing balance, gait abnormalities, and functional weakness. Will benefit from HHPT at d/c.   If plan is discharge home, recommend the following: A little help with walking and/or transfers;A little help with bathing/dressing/bathroom;Assistance with cooking/housework;Assist for transportation;Help with stairs or ramp for entrance   Can travel by private vehicle        Equipment Recommendations  None recommended by PT    Recommendations for Other Services       Precautions / Restrictions Precautions Precautions: Back;Fall Precaution Booklet Issued: Yes (comment) Recall of Precautions/Restrictions: Impaired Precaution/Restrictions Comments: Pt recalled 1/3 back precautions Required Braces or Orthoses: Spinal Brace Spinal Brace: Lumbar corset;Applied in sitting position Restrictions Weight Bearing Restrictions Per Provider Order: No     Mobility  Bed Mobility               General bed mobility comments: OOB in chair    Transfers Overall transfer level: Needs assistance Equipment used:  Rolling walker (2 wheels) Transfers: Sit to/from Stand Sit to Stand: Contact guard assist, Min assist           General transfer comment: Initial unsteadiness upon rise with posterior bracing against chair    Ambulation/Gait Ambulation/Gait assistance: Contact guard assist Gait Distance (Feet): 170 Feet Assistive device: Rolling walker (2 wheels) Gait Pattern/deviations: Decreased stride length, Step-through pattern Gait velocity: decreased     General Gait Details: Mild dynamic unsteadiness even with RW, requiring CGA. Intermittent R drift.   Stairs             Wheelchair Mobility     Tilt Bed    Modified Rankin (Stroke Patients Only)       Balance Overall balance assessment: Needs assistance Sitting-balance support: Feet supported, No upper extremity supported Sitting balance-Leahy Scale: Fair     Standing balance support: During functional activity, Reliant on assistive device for balance, Bilateral upper extremity supported Standing balance-Leahy Scale: Poor Standing balance comment: Dependent on RW and external support                            Communication Communication Communication: Impaired Factors Affecting Communication: Hearing impaired (Pt states that he does not wear hearing aids in hospital because they fall out)  Cognition Arousal: Alert Behavior During Therapy: [facility] for tasks assessed/performed   PT - Cognitive impairments: Safety/Judgement, Sequencing, Problem solving, Memory                         Following commands: Intact      Cueing Cueing Techniques: Verbal cues, Gestural cues  Exercises      General Comments General comments (  skin integrity, edema, etc.): Reinforced education on back precautions and implication of precautions on ADL tasks in home environment      Pertinent Vitals/Pain Pain Assessment Pain Assessment: 0-10 Pain Score: 6  Pain Location: lower back Pain Descriptors / Indicators:   (irritating) Pain Intervention(s): Limited activity within patient's tolerance, Monitored during session    Home Living                          Prior Function            PT Goals (current goals can now be found in the care plan section) Acute Rehab PT Goals Potential to Achieve Goals: Fair Progress towards PT goals: Progressing toward goals    Frequency    Min 5X/week      PT Plan      Co-evaluation              AM-PAC PT 6 Clicks Mobility   Outcome Measure  Help needed turning from your back to your side while in a flat bed without using bedrails?: A Little Help needed moving from lying on your back to sitting on the side of a flat bed without using bedrails?: A Little Help needed moving to and from a bed to a chair (including a wheelchair)?: A Little Help needed standing up from a chair using your arms (e.g., wheelchair or bedside chair)?: A Little Help needed to walk in hospital room?: A Little Help needed climbing 3-5 steps with a railing? : A Lot 6 Click Score: 17    End of Session Equipment Utilized During Treatment: Back brace;Gait belt Activity Tolerance: Patient tolerated treatment well Patient left: in chair;with call bell/phone within reach;Other (comment) (with OT) Nurse Communication: Mobility status PT Visit Diagnosis: Difficulty in walking, not elsewhere classified (R26.2);Muscle weakness (generalized) (M62.81);Other abnormalities of gait and mobility (R26.89);Unsteadiness on feet (R26.81);Pain     Time: 8647-8587 PT Time Calculation (min) (ACUTE ONLY): 20 min  Charges:    $Therapeutic Activity: 8-22 mins PT General Charges $$ ACUTE PT VISIT: 1 Visit                     Francisco Cowan, PT, DPT Acute Rehabilitation Services Office [phone]    Francisco Cowan 02/05/2024, 3:04 PM

## 2024-02-05 NOTE — Progress Notes (Signed)
    Subjective: Procedure(s) (LRB): EXTTEME LATERAL INTERBODY FUSION OF LUMBAR THREE TO LUMBAR FOUR WITH POSTERIOR SUPPLEMENTAL FUSION INSTRUMENTED OF LUMBAR THREE TO LUMBAR FOUR AND LUMBAR FOUR AND LUMBAR FIVE WITH LUMBAR DECOMPRESSION AT LUMBAR TWO TO LUMBAR THREE (N/A) POSTERIOR LUMBAR FUSION 2 LEVEL (N/A) 4 Days Post-Op  Patient reports pain as 3 on 0-10 scale.  Reports decreased leg pain reports some mild incisional back pain   Positive void Positive bowel movement Positive flatus Negative chest pain or shortness of breath  Objective: Vital signs in last 24 hours: Temp:  [97.7 F (36.5 C)-98.4 F (36.9 C)] 98.4 F (36.9 C) (12/08 0717) Pulse Rate:  [79-149] 80 (12/08 0717) Resp:  [15-26] 16 (12/08 0717) BP: (79-150)/(58-110) 140/86 (12/08 0717) SpO2:  [91 %-98 %] 98 % (12/08 0717)  Intake/Output from previous day: 12/07 0701 - 12/08 0700 In: -  Out: 400 [Urine:400]  Labs: Recent Labs    02/04/24 0912  WBC 11.4*  RBC 3.90*  HCT 36.2*  PLT 130*   Recent Labs    02/04/24 0912  NA 136  K 3.7  CL 100  CO2 25  BUN 16  CREATININE 1.00  GLUCOSE 110*  CALCIUM 8.5*   No results for input(s): LABPT, INR in the last 72 hours.  Physical Exam: Intact pulses distally Dorsiflexion/Plantar flexion intact Incision: dressing C/D/I No cellulitis present Compartment soft Body mass index is 30.41 kg/m.   Assessment/Plan: Francisco Cowan is a very pleasant 73 year old male who is POD 4 from XLIF L3-L4 with L2-L3 decompression and extension of PSFI L3-L5. Surgical intervention was successful and without complications. His hospital course has been complicated by an episode of Svt in which he had to be cardioverted with adenosine . He now is stable and is being monitored by Tele. We appreciate the hospitalist team and the cardiologist team for their recs. . He states that his pre-operative leg pain is much improved since surgery. He is ambulating on his own. He is tolerating oral  intake well. He is compliant with the LSO brace. He is complaint with the incentive spirometer. He reports that his pain is well controled with oral pain medications. Dressing is c/d/I.  Positive void, positive flatus, positive BM.   Plan: Patient stable  Continue mobilization with physical therapy Continue care  Continue pain medical mgmt  Continue to utilize LSO brace  Continue pulm toilet      Francisco Cowan Candance PA-C for Dr. Donaciano Sprang, MD [facility] [phone]

## 2024-02-05 NOTE — Plan of Care (Signed)

## 2024-02-05 NOTE — Assessment & Plan Note (Signed)
 GAD: Cont home ativan , zoloft , wellbutrin  HTN: Cont home lisinopril , amlodipine , bisoprolol  Gout: Cont home allopurinol 

## 2024-02-06 MED ORDER — AMIODARONE IV BOLUS ONLY 150 MG/100ML
150.0000 mg | Freq: Once | INTRAVENOUS | Status: DC
  Filled 2024-02-06: qty 100

## 2024-02-06 NOTE — Progress Notes (Signed)
 Telemetry called pt. Having sustained SVT,MD Mabe came and assessed pt.

## 2024-02-06 NOTE — Plan of Care (Signed)
 MD to bedside at 0544 after RN notified patient in sustained SVT. At bedside, patient sitting in chair, alert, comfortably. HR 77. Regular rhythm. Lungs CTAB, no accessory muscle use. Called telemetry in room, patient was in sustained SVT from 0528 to 0530. Reviewed on telemetry on floor. Yesterday, cardiology increased bisoprolol  to 10 mg at bedtime, first increased dose given 02/05/24 at 2130. No additional interventions indicated at this time. Will continue telemetry. Contingency for sustained SVT that does not convert to NSR with vagal maneuvers is bolus 150 mg amiodarone , per cardiology, [name] 02/04/24.   Francisco Art, MD Family Medicine PGY-1

## 2024-02-06 NOTE — Assessment & Plan Note (Deleted)
 GAD: Cont home ativan , zoloft , wellbutrin  HTN: Cont home lisinopril , amlodipine , bisoprolol  Gout: Cont home allopurinol 

## 2024-02-06 NOTE — Assessment & Plan Note (Deleted)
 2 brief runs of nonsustained SVT overnight, patient remained asymptomatic.  Bisoprolol  increased yesterday evening. - Cardiology signed out and is stable for discharge  - Increase bisoprolol  to 10mg  at bedtime - Outpatient cardiology follow-up 4 to 6 weeks with [name] - Repeat TSH outpt in 4 weeks

## 2024-02-06 NOTE — Assessment & Plan Note (Deleted)
 Pain controlled. Per TOC note, plan to DC home with [facility].

## 2024-02-06 NOTE — Discharge Summary (Signed)
 Patient ID: Francisco Cowan MRN: [id number] DOB/AGE: 08/28/1950 73 y.o.  Admit date: 02/01/2024 Discharge date: 02/06/2024  Admission Diagnoses:  Principal Problem:   S/P lumbar fusion Active Problems:   SVT (supraventricular tachycardia)   Chronic health problem   Discharge Diagnoses:  Principal Problem:   S/P lumbar fusion Active Problems:   SVT (supraventricular tachycardia)   Chronic health problem  status post Procedure(s): EXTTEME LATERAL INTERBODY FUSION OF LUMBAR THREE TO LUMBAR FOUR WITH POSTERIOR SUPPLEMENTAL FUSION INSTRUMENTED OF LUMBAR THREE TO LUMBAR FOUR AND LUMBAR FOUR AND LUMBAR FIVE WITH LUMBAR DECOMPRESSION AT LUMBAR TWO TO LUMBAR THREE POSTERIOR LUMBAR FUSION 2 LEVEL  Past Medical History:  Diagnosis Date   Abnormal ECG 09/19/2017   NSR. Left axis deviation.    Alcohol abuse    Anxiety    Cutaneous T-cell lymphoma (HCC)    Depression    ED (erectile dysfunction)    GERD (gastroesophageal reflux disease)    History of kidney stones    Hypertension    Moderate tetrahydrocannabinol (THC) dependence (HCC)    Neuropathy    arms and legs   Photodermatitis    Sleep apnea    uses CPAP   Spinal stenosis    Urge incontinence     Surgeries: Procedure(s): EXTTEME LATERAL INTERBODY FUSION OF LUMBAR THREE TO LUMBAR FOUR WITH POSTERIOR SUPPLEMENTAL FUSION INSTRUMENTED OF LUMBAR THREE TO LUMBAR FOUR AND LUMBAR FOUR AND LUMBAR FIVE WITH LUMBAR DECOMPRESSION AT LUMBAR TWO TO LUMBAR THREE POSTERIOR LUMBAR FUSION 2 LEVEL on 02/01/2024   Consultants:   Discharged Condition: Improved  Hospital Course: Francisco Cowan is an 73 y.o. male who was admitted 02/01/2024 for operative treatment of S/P lumbar fusion. Patient failed conservative treatments (please see the history and physical for the specifics) and had severe unremitting pain that affects sleep, daily activities and work/hobbies. After pre-op clearance, the patient was taken to the operating room on  02/01/2024 and underwent  Procedure(s): EXTTEME LATERAL INTERBODY FUSION OF LUMBAR THREE TO LUMBAR FOUR WITH POSTERIOR SUPPLEMENTAL FUSION INSTRUMENTED OF LUMBAR THREE TO LUMBAR FOUR AND LUMBAR FOUR AND LUMBAR FIVE WITH LUMBAR DECOMPRESSION AT LUMBAR TWO TO LUMBAR THREE POSTERIOR LUMBAR FUSION 2 LEVEL.    Patient was given perioperative antibiotics:  Anti-infectives (From admission, onward)    Start     Dose/Rate Route Frequency Ordered Stop   02/01/24 1615  ceFAZolin  (ANCEF ) IVPB 1 g/50 mL premix        1 g 100 mL/hr over 30 Minutes Intravenous Every 8 hours 02/01/24 1551 02/02/24 0800   02/01/24 0615  ceFAZolin  (ANCEF ) 2-4 GM/100ML-% IVPB       Note to Pharmacy: Francisco Cowan N: cabinet override      02/01/24 0615 02/01/24 0818   02/01/24 9391  ceFAZolin  (ANCEF ) IVPB 2g/100 mL premix        2 g 200 mL/hr over 30 Minutes Intravenous 30 min pre-op 02/01/24 9391 02/01/24 0810        Patient was given sequential compression devices and early ambulation to prevent DVT.   Patient benefited maximally from hospital stay and there were no complications. At the time of discharge, the patient was urinating/moving their bowels without difficulty, tolerating a regular diet, pain is controlled with oral pain medications and they have been cleared by PT/OT.   Recent vital signs: Patient Vitals for the past 24 hrs:  BP Temp Temp src Pulse SpO2  02/06/24 0755 (!) 158/91 98 F (36.7 C) -- 76 93 %  02/06/24 0547 Francisco Cowan)  148/86 97.8 F (36.6 C) Oral 77 --  02/06/24 0404 (!) 156/92 98.2 F (36.8 C) Oral 75 95 %  02/06/24 0223 (!) 138/123 98 F (36.7 C) Oral 75 97 %  02/05/24 2038 (!) 151/85 97.7 F (36.5 C) Oral 82 97 %  02/05/24 1351 (!) 149/91 97.6 F (36.4 C) Oral 84 95 %     Recent laboratory studies:  Recent Labs    02/04/24 0912  WBC 11.4*  HGB 12.5*  HCT 36.2*  PLT 130*  NA 136  K 3.7  CL 100  CO2 25  BUN 16  CREATININE 1.00  GLUCOSE 110*  CALCIUM 8.5*     Discharge  Medications:   Allergies as of 02/06/2024       Reactions   Nsaids Other (See Comments)   GI Bleeding   Aspirin    Other Reaction(s): Not available   Cortisone    Pt stated, I am only allergic to cortisone injections Pt states he thought he had anaphylactic reaction to cortisone injection, but he has had cortisone injections since        Medication List     TAKE these medications    acetaminophen  325 MG tablet Commonly known as: TYLENOL  Take 2 tablets (650 mg total) by mouth every 6 (six) hours as needed for up to 20 days for mild pain (pain score 1-3) or fever (or temp > 100.5).   allopurinol  300 MG tablet Commonly known as: ZYLOPRIM  Take 0.5 tablets (150 mg total) by mouth daily.   amLODipine  2.5 MG tablet Commonly known as: NORVASC  Take 2.5 mg by mouth daily.   ammonium lactate 12 % lotion Commonly known as: LAC-HYDRIN Apply 1 Application topically as needed for dry skin.   B-12 PO Take 1 tablet by mouth daily.   bisoprolol  5 MG tablet Commonly known as: ZEBETA  Take 1 tablet (5 mg total) by mouth daily. What changed: Another medication with the same name was added. Make sure you understand how and when to take each.   bisoprolol  10 MG tablet Commonly known as: ZEBETA  Take 1 tablet (10 mg total) by mouth at bedtime. What changed: You were already taking a medication with the same name, and this prescription was added. Make sure you understand how and when to take each.   buPROPion  300 MG 24 hr tablet Commonly known as: WELLBUTRIN  XL TAKE 1 TABLET (300 MG TOTAL) BY MOUTH DAILY. TAKING WITH WELLBUTRIN  SR 150MG  FOR 450MG  TOTAL DAILY.   buPROPion  150 MG 24 hr tablet Commonly known as: Wellbutrin  XL Take 1 tablet (150 mg total) by mouth daily. Take with 300 mg tablet for 450 mg total daily   celecoxib  50 MG capsule Commonly known as: CeleBREX  Take 1 capsule (50 mg total) by mouth 2 (two) times daily as needed for pain.   cetirizine 10 MG tablet Commonly  known as: ZYRTEC Take 10 mg by mouth daily.   clobetasol cream 0.05 % Commonly known as: TEMOVATE Apply 1 Application topically 2 (two) times daily as needed (Dermatitis).   lisinopril  10 MG tablet Commonly known as: ZESTRIL  Take 10 mg by mouth daily.   LORazepam  1 MG tablet Commonly known as: ATIVAN  Take 1 tablet (1 mg total) by mouth every 8 (eight) hours as needed for anxiety or sleep.   methocarbamol  500 MG tablet Commonly known as: ROBAXIN  Take 1 tablet (500 mg total) by mouth every 6 (six) hours as needed for up to 20 days for muscle spasms.   multivitamin capsule  Take 1 capsule by mouth daily.   oxyCODONE  5 MG immediate release tablet Commonly known as: Oxy IR/ROXICODONE  Take 1 tablet (5 mg total) by mouth every 4 (four) hours as needed for moderate pain (pain score 4-6).   pantoprazole  40 MG tablet Commonly known as: PROTONIX  Take 1 tablet (40 mg total) by mouth daily.   PROBIOTIC PO Take 1 tablet by mouth daily.   SALMON OIL PO Take 1 Dose by mouth daily.   senna-docusate 8.6-50 MG tablet Commonly known as: Senokot-S Take 1 tablet by mouth 2 (two) times daily for 20 days.   sertraline  100 MG tablet Commonly known as: ZOLOFT  Take 1 tablet (100 mg total) by mouth daily.   tamsulosin  0.4 MG Caps capsule Commonly known as: FLOMAX  TAKE 1 CAPSULE BY MOUTH EVERY DAY   traZODone  100 MG tablet Commonly known as: DESYREL  Take 1-2 tablets (100-200 mg total) by mouth at bedtime as needed. What changed:  how much to take when to take this        Diagnostic Studies: DG Lumbar Spine 2-3 Views Result Date: 02/01/2024 CLINICAL DATA:  Elective surgery EXAM: LUMBAR SPINE - 2-3 VIEW COMPARISON:  None Available. FINDINGS: Sixteen fluoroscopic spot views of the lumbar spine submitted from the operating room. Image 1 demonstrates posterior fusion hardware at L4-L5 with interbody spacer. Subsequent interbody spacer at L3-L4 with extension of posterior hardware to the  L3-L4 level. Fluoroscopy time 3 minutes 34 seconds. Dose 194.88 mGy. IMPRESSION: Intraoperative fluoroscopy during lumbar fusion. Electronically Signed   By: Andrea Gasman M.D.   On: 02/01/2024 14:54   DG C-Arm 1-60 Min-No Report Result Date: 02/01/2024 Fluoroscopy was utilized by the requesting physician.  No radiographic interpretation.   DG C-Arm 1-60 Min-No Report Result Date: 02/01/2024 Fluoroscopy was utilized by the requesting physician.  No radiographic interpretation.   DG C-Arm 1-60 Min-No Report Result Date: 02/01/2024 Fluoroscopy was utilized by the requesting physician.  No radiographic interpretation.   DG C-Arm 1-60 Min-No Report Result Date: 02/01/2024 Fluoroscopy was utilized by the requesting physician.  No radiographic interpretation.   DG C-Arm 1-60 Min-No Report Result Date: 02/01/2024 Fluoroscopy was utilized by the requesting physician.  No radiographic interpretation.    Discharge Instructions     Call MD / Call 911   Complete by: As directed    If you experience chest pain or shortness of breath, CALL 911 and be transported to the hospital emergency room.  If you develope a fever above 101 F, pus (white drainage) or increased drainage or redness at the wound, or calf pain, call your surgeon's office.   Constipation Prevention   Complete by: As directed    Drink plenty of fluids.  Prune juice may be helpful.  You may use a stool softener, such as Colace (over the counter) 100 mg twice a day.  Use MiraLax  (over the counter) for constipation as needed.   Diet - low sodium heart healthy   Complete by: As directed    Increase activity slowly as tolerated   Complete by: As directed    Please use LSO at all time when ambulating   Post-operative opioid taper instructions:   Complete by: As directed    POST-OPERATIVE OPIOID TAPER INSTRUCTIONS: It is important to wean off of your opioid medication as soon as possible. If you do not need pain medication after your  surgery it is ok to stop day one. Opioids include: Codeine, Hydrocodone(Norco, Vicodin), Oxycodone (Percocet, oxycontin ) and hydromorphone  amongst others.  Long  term and even short term use of opiods can cause: Increased pain response Dependence Constipation Depression Respiratory depression And more.  Withdrawal symptoms can include Flu like symptoms Nausea, vomiting And more Techniques to manage these symptoms Hydrate well Eat regular healthy meals Stay active Use relaxation techniques(deep breathing, meditating, yoga) Do Not substitute Alcohol to help with tapering If you have been on opioids for less than two weeks and do not have pain than it is ok to stop all together.  Plan to wean off of opioids This plan should start within one week post op of your joint replacement. Maintain the same interval or time between taking each dose and first decrease the dose.  Cut the total daily intake of opioids by one tablet each day Next start to increase the time between doses. The last dose that should be eliminated is the evening dose.           Contact information for follow-up providers     Burnetta Aures, MD. Call today.   Specialty: Orthopedic Surgery Why: 2 week post-op appointment please call to schedule if it has not already been scheduled for you Contact information: [address] [place] [address] [phone]         Delford Maude BROCKS, MD. Call today.   Specialty: Cardiology Why: To make an appointment for 4-6  weeks from now for a cardiology recheck Contact information: [address] [place] [address] [phone]              Contact information for after-discharge care     Home Medical Care     [facility]) .   Service: Home Health Services Contact information: [address] [place]  [phone]                     Discharge Plan:  discharge to home    Disposition:  Francisco Cowan is a very pleasant 73 year old male who is POD 5 from XLIF L3-L4 with L2-L3 decompression and extension of PSFI L3-L5. Surgical intervention was successful and without complications. His hospital course has been complicated by an episode of SVT in which he had to be cardioverted with adenosine . He now is stable. Will plan to follow up with cardio in 4-6 weeks with [name].  We appreciate the hospitalist team and the cardiologist team for their recs. . He states that his pre-operative leg pain is much improved since surgery. He is ambulating on his own. He is tolerating oral intake well. He is compliant with the LSO brace. He is complaint with the incentive spirometer. He reports that his pain is well controled with oral pain medications. Dressing is c/d/I.  Positive void, positive flatus, positive BM. Patient will follow up with us  in clinic in 2 weeks. Patient understands that she cannot shower for the first 5 days post-op. All questions were welcomed and addressed.    Signed: Jeoffrey Cowan Sages PA-C for Dr. Aures Burn[facility] [phone] 02/06/2024, 8:35 AM

## 2024-02-06 NOTE — Progress Notes (Signed)
 Physical Therapy Treatment Patient Details Name: KEPLER MCCABE MRN: [id number] DOB: 1950/05/20 Today's Date: 02/06/2024   History of Present Illness 73 y.o. 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 Pt s/p XLIF L3-L4 with L2-L3 decompression and extension of PSFI L3-L5 12/4. Rapid Response called 12/7 d/t SVT of 148bpm. Chemical cardioversion via 6mg  IV Adenosine . Pt converted to NSR. PMHx: ETOH abuse, anxiety, Tcell lymphoma, HTN, neuropathy, GERD, and OSA on CPAP.    PT Comments  Pt continues to make steady progress. He is received sitting up in chair and dressed; requires assist for donning shoes with LB dressing. Pt with improved ease of sit to stand transfer today from chair and ambulating an additional 30 ft in comparison to yesterday, up to 200 ft with RW. Continues with drift and decreased bilateral foot clearance. Reviewed spinal precautions (pt recalling 2/3) and recommendation of use of RW with all mobility for external support/stability. Pt verbalized understanding.     If plan is discharge home, recommend the following: A little help with walking and/or transfers;A little help with bathing/dressing/bathroom;Assistance with cooking/housework;Assist for transportation;Help with stairs or ramp for entrance   Can travel by private vehicle        Equipment Recommendations  None recommended by PT    Recommendations for Other Services       Precautions / Restrictions Precautions Precautions: Back;Fall Precaution Booklet Issued: Yes (comment) Recall of Precautions/Restrictions: Impaired Precaution/Restrictions Comments: Pt recalled 2/3 back precautions Required Braces or Orthoses: Spinal Brace Spinal Brace: Lumbar corset;Applied in sitting position Restrictions Weight Bearing Restrictions Per Provider Order: No     Mobility  Bed Mobility               General bed mobility comments: OOB in chair     Transfers Overall transfer level: Needs assistance Equipment used: Rolling walker (2 wheels) Transfers: Sit to/from Stand Sit to Stand: Supervision                Ambulation/Gait Ambulation/Gait assistance: Contact guard assist Gait Distance (Feet): 200 Feet Assistive device: Rolling walker (2 wheels) Gait Pattern/deviations: Decreased stride length, Step-through pattern Gait velocity: decreased     General Gait Details: Mild dynamic unsteadiness even with RW, requiring CGA. Intermittent R drift. Verbal cues for obstacle negotiation and increased foot clearance   Stairs             Wheelchair Mobility     Tilt Bed    Modified Rankin (Stroke Patients Only)       Balance Overall balance assessment: Needs assistance Sitting-balance support: Feet supported, No upper extremity supported Sitting balance-Leahy Scale: Fair     Standing balance support: During functional activity, Reliant on assistive device for balance, Bilateral upper extremity supported Standing balance-Leahy Scale: Poor Standing balance comment: Dependent on RW and external support                            Communication Communication Communication: Impaired Factors Affecting Communication: Hearing impaired (Pt states that he does not wear hearing aids in hospital because they fall out)  Cognition Arousal: Alert Behavior During Therapy: [facility] for tasks assessed/performed   PT - Cognitive impairments: Safety/Judgement, Sequencing, Problem solving, Memory                         Following commands: Intact      Cueing Cueing Techniques: Verbal cues,  Gestural cues  Exercises      General Comments        Pertinent Vitals/Pain Pain Assessment Pain Assessment: Faces Faces Pain Scale: Hurts little more Pain Location: lower back Pain Descriptors / Indicators:  (irritating) Pain Intervention(s): Monitored during session    Home Living                           Prior Function            PT Goals (current goals can now be found in the care plan section) Acute Rehab PT Goals Potential to Achieve Goals: Fair Progress towards PT goals: Progressing toward goals    Frequency    Min 5X/week      PT Plan      Co-evaluation              AM-PAC PT 6 Clicks Mobility   Outcome Measure  Help needed turning from your back to your side while in a flat bed without using bedrails?: A Little Help needed moving from lying on your back to sitting on the side of a flat bed without using bedrails?: A Little Help needed moving to and from a bed to a chair (including a wheelchair)?: A Little Help needed standing up from a chair using your arms (e.g., wheelchair or bedside chair)?: A Little Help needed to walk in hospital room?: A Little Help needed climbing 3-5 steps with a railing? : A Lot 6 Click Score: 17    End of Session Equipment Utilized During Treatment: Back brace;Gait belt Activity Tolerance: Patient tolerated treatment well Patient left: in chair;with call bell/phone within reach;with chair alarm set Nurse Communication: Mobility status PT Visit Diagnosis: Difficulty in walking, not elsewhere classified (R26.2);Muscle weakness (generalized) (M62.81);Other abnormalities of gait and mobility (R26.89);Unsteadiness on feet (R26.81);Pain     Time: 9180-9169 PT Time Calculation (min) (ACUTE ONLY): 11 min  Charges:    $Therapeutic Activity: 8-22 mins PT General Charges $$ ACUTE PT VISIT: 1 Visit                     Aleck Daring, PT, DPT Acute Rehabilitation Services Office [phone]    Aleck ONEIDA Daring 02/06/2024, 11:07 AM

## 2024-02-06 NOTE — Progress Notes (Signed)
 Pt discharged home with spouse in stable condition. DC instructions given. Scripts sent to pharmacy of choice. No immediate questions or concerns at this time.

## 2024-02-06 NOTE — Plan of Care (Signed)
 Problem: Education: Goal: Knowledge of General Education information will improve Description: Including pain rating scale, medication(s)/side effects and non-pharmacologic comfort measures Outcome: Adequate for Discharge   Problem: Health Behavior/Discharge Planning: Goal: Ability to manage health-related needs will improve Outcome: Adequate for Discharge   Problem: Clinical Measurements: Goal: Ability to maintain clinical measurements within normal limits will improve Outcome: Adequate for Discharge Goal: Will remain free from infection Outcome: Adequate for Discharge Goal: Diagnostic test results will improve Outcome: Adequate for Discharge Goal: Respiratory complications will improve Outcome: Adequate for Discharge Goal: Cardiovascular complication will be avoided Outcome: Adequate for Discharge   Problem: Activity: Goal: Risk for activity intolerance will decrease Outcome: Adequate for Discharge   Problem: Nutrition: Goal: Adequate nutrition will be maintained Outcome: Adequate for Discharge   Problem: Coping: Goal: Level of anxiety will decrease Outcome: Adequate for Discharge   Problem: Elimination: Goal: Will not experience complications related to bowel motility Outcome: Adequate for Discharge Goal: Will not experience complications related to urinary retention Outcome: Adequate for Discharge   Problem: Pain Managment: Goal: General experience of comfort will improve and/or be controlled Outcome: Adequate for Discharge   Problem: Safety: Goal: Ability to remain free from injury will improve Outcome: Adequate for Discharge   Problem: Skin Integrity: Goal: Risk for impaired skin integrity will decrease Outcome: Adequate for Discharge   Problem: Education: Goal: Ability to verbalize activity precautions or restrictions will improve Outcome: Adequate for Discharge Goal: Knowledge of the prescribed therapeutic regimen will improve Outcome: Adequate for  Discharge Goal: Understanding of discharge needs will improve Outcome: Adequate for Discharge   Problem: Activity: Goal: Ability to avoid complications of mobility impairment will improve Outcome: Adequate for Discharge Goal: Ability to tolerate increased activity will improve Outcome: Adequate for Discharge Goal: Will remain free from falls Outcome: Adequate for Discharge   Problem: Bowel/Gastric: Goal: Gastrointestinal status for postoperative course will improve Outcome: Adequate for Discharge   Problem: Clinical Measurements: Goal: Ability to maintain clinical measurements within normal limits will improve Outcome: Adequate for Discharge Goal: Postoperative complications will be avoided or minimized Outcome: Adequate for Discharge Goal: Diagnostic test results will improve Outcome: Adequate for Discharge   Problem: Pain Management: Goal: Pain level will decrease Outcome: Adequate for Discharge   Problem: Skin Integrity: Goal: Will show signs of wound healing Outcome: Adequate for Discharge   Problem: Health Behavior/Discharge Planning: Goal: Identification of resources available to assist in meeting health care needs will improve Outcome: Adequate for Discharge   Problem: Bladder/Genitourinary: Goal: Urinary functional status for postoperative course will improve Outcome: Adequate for Discharge   Problem: Acute Rehab OT Goals (only OT should resolve) Goal: Pt. Will Perform Grooming Outcome: Adequate for Discharge Goal: Pt. Will Perform Lower Body Dressing Outcome: Adequate for Discharge Goal: Pt. Will Transfer To Toilet Outcome: Adequate for Discharge Goal: Pt. Will Perform Toileting-Clothing Manipulation Outcome: Adequate for Discharge Goal: OT Additional ADL Goal #1 Outcome: Adequate for Discharge   Problem: Acute Rehab PT Goals(only PT should resolve) Goal: Pt will Roll Supine to Side Outcome: Adequate for Discharge Goal: Pt Will Go Supine/Side To  Sit Outcome: Adequate for Discharge Goal: Pt Will Go Sit To Supine/Side Outcome: Adequate for Discharge Goal: Patient Will Transfer Sit To/From Stand Outcome: Adequate for Discharge Goal: Pt Will Transfer Bed To Chair/Chair To Bed Outcome: Adequate for Discharge Goal: Pt Will Ambulate Outcome: Adequate for Discharge Goal: Pt Will Verbalize and Adhere to Precautions While Description: PT Will Verbalize and Adhere to Precautions While Performing Mobility Outcome: Adequate for  Discharge

## 2024-02-15 NOTE — Telephone Encounter (Signed)
 Last office visit: 04/13/22 Future appointments: none scheduled   DX: GERD         Patient's request for medication is as follows:  Requested Prescriptions   Pending Prescriptions Disp Refills   pantoprazole  DR (PROTONIX ) 40 mg tablet [Pharmacy Med Name: PANTOPRAZOLE  SOD DR 40 MG TAB] 60 tablet 1    Sig: TAKE 1 TABLET BY MOUTH TWICE A DAY BEFORE BREAKFAST AND DINNER    Please approve the above prescription(s) to electronically send to pharmacy.  Ronnald Ferrara, RN

## 2024-02-20 ENCOUNTER — Other Ambulatory Visit: Payer: Self-pay | Admitting: Family Medicine

## 2024-02-24 DIAGNOSIS — G4733 Obstructive sleep apnea (adult) (pediatric): Secondary | ICD-10-CM | POA: Diagnosis not present

## 2024-02-28 ENCOUNTER — Other Ambulatory Visit: Payer: Self-pay | Admitting: Family Medicine

## 2024-02-28 DIAGNOSIS — N400 Enlarged prostate without lower urinary tract symptoms: Secondary | ICD-10-CM

## 2024-03-06 ENCOUNTER — Ambulatory Visit: Admitting: General Practice

## 2024-03-07 NOTE — Progress Notes (Unsigned)
" °  Cardiology Office Note   Date:  03/07/2024  ID:  Francisco Cowan, DOB 02-24-1951, MRN [id number] PCP: Cleotilde Lukes, DO  [facility] Providers Cardiologist:  None { Click to update primary MD,subspecialty MD or APP then REFRESH:1}    History of Present Illness Francisco Cowan is a 74 y.o. male with history of SVT, OSA on CPAP, hypertension, RBBB, ETOH abuse, and THC use.     He was admitted for lumbar fusion 02/01/24 and was about to be discharged 02/04/24.He appeared to have rapid pulse, and EKG showed SVT with retrograde P waves. Discharge was canceled and patient given Adenosine  6 mg rapid push. He converted to NSR rate of 103 from 150.   His bisoprolol  was increased to 10 mg daily. Echocardiogram recommended outpatient and close follow up with cardiology. He was discharged home on 02/06/24.     He presents today for hospital follow up in the setting of SVT.   ROS: All systems negative unless otherwise indicated in HPI.   Studies Reviewed      *** Risk Assessment/Calculations {Does this patient have ATRIAL FIBRILLATION?[phone]} No BP recorded.  {Refresh Note OR Click here to enter BP  :1}***       Physical Exam VS:  There were no vitals taken for this visit.       Wt Readings from Last 3 Encounters:  02/01/24 200 lb (90.7 kg)  01/23/24 203 lb 4.8 oz (92.2 kg)  12/19/23 204 lb (92.5 kg)    GEN: Well nourished, well developed in no acute distress NECK: No JVD; No carotid bruits CARDIAC: ***RRR, no murmurs, rubs, gallops RESPIRATORY:  Clear to auscultation without rales, wheezing or rhonchi  ABDOMEN: Soft, non-tender, non-distended EXTREMITIES:  No edema; No deformity   ASSESSMENT AND PLAN ***    {Are you ordering a CV Procedure (e.g. stress test, cath, DCCV, TEE, etc)?   Press F2        :[id number]}  Dispo: ***  Signed, Mardy KATHEE Pizza, FNP  "

## 2024-03-11 ENCOUNTER — Other Ambulatory Visit: Payer: Self-pay | Admitting: Family Medicine

## 2024-03-11 DIAGNOSIS — F320 Major depressive disorder, single episode, mild: Secondary | ICD-10-CM

## 2024-03-12 ENCOUNTER — Ambulatory Visit: Attending: Nurse Practitioner

## 2024-03-12 ENCOUNTER — Ambulatory Visit: Payer: Self-pay | Admitting: General Practice

## 2024-03-12 VITALS — BP 110/64 | HR 64 | Ht 68.0 in | Wt 202.8 lb

## 2024-03-12 DIAGNOSIS — I471 Supraventricular tachycardia, unspecified: Secondary | ICD-10-CM | POA: Diagnosis not present

## 2024-03-12 DIAGNOSIS — I2489 Other forms of acute ischemic heart disease: Secondary | ICD-10-CM | POA: Diagnosis not present

## 2024-03-12 DIAGNOSIS — I10 Essential (primary) hypertension: Secondary | ICD-10-CM

## 2024-03-12 DIAGNOSIS — I4510 Unspecified right bundle-branch block: Secondary | ICD-10-CM

## 2024-03-12 NOTE — Progress Notes (Signed)
 " Cardiology Office Note   Date:  03/12/2024  ID:  Francisco Cowan, DOB April 08, 1950, MRN [id number] PCP: Cleotilde Lukes, DO  [facility] Providers Cardiologist:  None     History of Present Illness Francisco Cowan is a 74 y.o. male with history of SVT, OSA on CPAP, hypertension, RBBB, ETOH abuse, and THC use.     He was admitted for lumbar fusion 02/01/24 and was about to be discharged 02/04/24. He appeared to have rapid pulse, and EKG showed SVT with retrograde P waves. Discharge was canceled and patient given Adenosine  6 mg rapid push. He converted to NSR rate of 103 from 150.   His bisoprolol  was increased to 10 mg daily. Troponin slightly elevated in mid 50's. Demand ischemia likely the cause of elevated troponin. Echocardiogram recommended outpatient and close follow up with cardiology. He was discharged home on 02/06/24.     He presents today for hospital follow up in the setting of SVT. He is doing well overall today. He has been working with PT after his lumbar surgery. He agrees that things are moving in the right direction. He notes no palpitations, or feeling like his heart is racing. He does not check his BP at home, but it is at goal in office today. He denies chest pain, shortness of breath, lower extremity edema, fatigue, palpitations, melena, hematuria, hemoptysis, diaphoresis, weakness, presyncope, syncope, orthopnea, and PND.  ROS: All systems negative unless otherwise indicated in HPI.   Studies Reviewed EKG Interpretation Date/Time:  Tuesday March 12 2024 14:36:17 EST Ventricular Rate:  64 PR Interval:  168 QRS Duration:  134 QT Interval:  416 QTC Calculation: 429 R Axis:   -73  Text Interpretation: Normal sinus rhythm with sinus arrhythmia Right bundle branch block Left anterior fascicular block Bifascicular block When compared with ECG of 04-Feb-2024 08:32, Sinus rhythm has replaced Wide QRS tachycardia Vent. rate has decreased BY  82 BPM Confirmed by  Teresa Fish [phone]) on 03/12/2024 2:51:42 PM        Risk Assessment/Calculations           Physical Exam VS:  BP 110/64 (BP Location: Left Arm, Patient Position: Sitting, Cuff Size: Large)   Pulse 64   Ht 5' 8 (1.727 m)   Wt 202 lb 12.8 oz (92 kg)   SpO2 94%   BMI 30.84 kg/m        Wt Readings from Last 3 Encounters:  03/12/24 202 lb 12.8 oz (92 kg)  02/01/24 200 lb (90.7 kg)  01/23/24 203 lb 4.8 oz (92.2 kg)    GEN: Well nourished, well developed in no acute distress NECK: No JVD; No carotid bruits CARDIAC: RRR, no murmurs, rubs, gallops RESPIRATORY:  Clear to auscultation without rales, wheezing or rhonchi  ABDOMEN: Soft, non-tender, non-distended EXTREMITIES:  No edema; No deformity   ASSESSMENT AND PLAN  Demand ischemia secondary to SVT- Had episode of SVT in hospital prior to discharge following lumbar fusion 02/05/24. He converted to sinus rhythm with Adenosine  6 mg. Troponin's were mildly elevated in low 20's. He has been participating in PT since his lumbar fusion. He denies any ischemic symptoms, no ischemic evaluation needed at this time. Today he denies any palpitations, syncope, dizziness, and edema.  Will repeat echo.   RBBB- Noted to have chronic RBBB. Today EKG was NSR with RBBB.  Hypertension- BP today 110/64. He does not check BP daily. Discussed to monitor BP at home at least 2 hours after medications and sitting  for 5-10 minutes. Continue bisoprolol  15 mg daily and amlodipine  2.5 mg.        Dispo: Follow up with APP after echo.   Signed, Mardy KATHEE Pizza, FNP  "

## 2024-03-12 NOTE — Patient Instructions (Signed)
 Medication Instructions:  NO CHANGES *If you need a refill on your cardiac medications before your next appointment, please call your pharmacy*  Lab Work: NO LABS If you have labs (blood work) drawn today and your tests are completely normal, you will receive your results only by: MyChart Message (if you have MyChart) OR A paper copy in the mail If you have any lab test that is abnormal or we need to change your treatment, we will call you to review the results.  Testing/Procedures:[address] Your physician has requested that you have an echocardiogram. Echocardiography is a painless test that uses sound waves to create images of your heart. It provides your doctor with information about the size and shape of your heart and how well your hearts chambers and valves are working. This procedure takes approximately one hour. There are no restrictions for this procedure. Please do NOT wear cologne, perfume, aftershave, or lotions (deodorant is allowed). Please arrive 15 minutes prior to your appointment time.  Please note: We ask at that you not bring children with you during ultrasound (echo/ vascular) testing. Due to room size and safety concerns, children are not allowed in the ultrasound rooms during exams. Our front office staff cannot provide observation of children in our lobby area while testing is being conducted. An adult accompanying a patient to their appointment will only be allowed in the ultrasound room at the discretion of the ultrasound technician under special circumstances. We apologize for any inconvenience.   Follow-Up: At [facility], you and your health needs are our priority.  As part of our continuing mission to provide you with exceptional heart care, our providers are all part of one team.  This team includes your primary Cardiologist (physician) and Advanced Practice Providers or APPs (Physician Assistants and Nurse Practitioners) who all work together to  provide you with the care you need, when you need it.  Your next appointment:   1 year(s)  Provider:   Josefa Beauvais, NP

## 2024-03-25 LAB — GENECONNECT MOLECULAR SCREEN: Genetic Analysis Overall Interpretation: NEGATIVE

## 2024-03-28 ENCOUNTER — Ambulatory Visit: Payer: Medicare HMO

## 2024-03-28 VITALS — Ht 68.0 in | Wt 195.0 lb

## 2024-03-28 DIAGNOSIS — Z0000 Encounter for general adult medical examination without abnormal findings: Secondary | ICD-10-CM | POA: Diagnosis not present

## 2024-03-28 NOTE — Progress Notes (Addendum)
 "  Chief Complaint  Patient presents with   Medicare Wellness    SUBSEQUENT     Subjective:   Francisco Cowan is a 74 y.o. male who presents for a Medicare Annual Wellness Visit.  Visit info / Clinical Intake: Medicare Wellness Visit Type:: Subsequent Annual Wellness Visit Persons participating in visit and providing information:: patient Medicare Wellness Visit Mode:: Video Since this visit was completed virtually, some vitals may be partially provided or unavailable. Missing vitals are due to the limitations of the virtual format.: Documented vitals are patient reported If Telephone or Video please confirm:: I connected with patient using audio/video enable telemedicine. I verified patient identity with two identifiers, discussed telehealth limitations, and patient agreed to proceed. Patient Location:: Home Provider Location:: Home Office Interpreter Needed?: No Pre-visit prep was completed: yes AWV questionnaire completed by patient prior to visit?: yes Date:: 03/24/24 Living arrangements:: lives with spouse/significant other Patient's Overall Health Status Rating: good Typical amount of pain: some Does pain affect daily life?: (!) yes Are you currently prescribed opioids?: (!) yes  Dietary Habits and Nutritional Risks How many meals a day?: 3 Eats fruit and vegetables daily?: yes Most meals are obtained by: preparing own meals; eating out In the last 2 weeks, have you had any of the following?: none Diabetic:: no  Functional Status Activities of Daily Living (to include ambulation/medication): Independent Ambulation: Independent Medication Administration: Independent Home Management (perform basic housework or laundry): Independent Manage your own finances?: yes Primary transportation is: driving Concerns about vision?: no *vision screening is required for WTM* Concerns about hearing?: no Uses hearing aids?: (!) yes  Fall Screening Falls in the past year?:  1 Number of falls in past year: 0 Was there an injury with Fall?: 1 Fall Risk Category Calculator: 2 Patient Fall Risk Level: Moderate Fall Risk  Fall Risk Patient at Risk for Falls Due to: Impaired balance/gait Fall risk Follow up: Falls evaluation completed; Education provided  Home and Transportation Safety: All rugs have non-skid backing?: yes All stairs or steps have railings?: yes Grab bars in the bathtub or shower?: yes Have non-skid surface in bathtub or shower?: (!) no Good home lighting?: yes Regular seat belt use?: yes Hospital stays in the last year:: (!) yes How many hospital stays:: 1 Reason: S/P LUMBAR FUSION  Cognitive Assessment Difficulty concentrating, remembering, or making decisions? : no Will 6CIT or Mini Cog be Completed: yes What year is it?: 0 points What month is it?: 0 points Give patient an address phrase to remember (5 components): [address] [place], [place] About what time is it?: 0 points Count backwards from 20 to 1: 0 points Say the months of the year in reverse: 0 points Repeat the address phrase from earlier: 0 points 6 CIT Score: 0 points  Advance Directives (For Healthcare) Does Patient Have a Medical Advance Directive?: Yes Does patient want to make changes to medical advance directive?: No - Patient declined Type of Advance Directive: Healthcare Power of [place]; Living will Copy of Healthcare Power of Attorney in Chart?: Yes - validated most recent copy scanned in chart (See row information) Copy of Living Will in Chart?: Yes - validated most recent copy scanned in chart (See row information) Would patient like information on creating a medical advance directive?: No - Patient declined  Reviewed/Updated  Reviewed/Updated: Reviewed All (Medical, Surgical, Family, Medications, Allergies, Care Teams, Patient Goals)    Allergies (verified) Nsaids, Aspirin, and Cortisone   Current Medications (verified) Outpatient Encounter  Medications  as of 03/28/2024  Medication Sig   allopurinol  (ZYLOPRIM ) 300 MG tablet Take 0.5 tablets (150 mg total) by mouth daily.   amLODipine  (NORVASC ) 2.5 MG tablet Take 2.5 mg by mouth daily.   ammonium lactate (LAC-HYDRIN) 12 % lotion Apply 1 Application topically as needed for dry skin.   bisoprolol  (ZEBETA ) 10 MG tablet Take 1 tablet (10 mg total) by mouth at bedtime.   bisoprolol  (ZEBETA ) 5 MG tablet Take 1 tablet (5 mg total) by mouth daily.   buPROPion  (WELLBUTRIN  XL) 150 MG 24 hr tablet TAKE 1 TABLET (150 MG TOTAL) BY MOUTH DAILY. TAKE WITH 300 MG TABLET FOR 450 MG TOTAL DAILY   buPROPion  (WELLBUTRIN  XL) 300 MG 24 hr tablet TAKE 1 TABLET (300 MG TOTAL) BY MOUTH DAILY. TAKING WITH WELLBUTRIN  SR 150MG  FOR 450MG  TOTAL DAILY.   celecoxib  (CELEBREX ) 50 MG capsule Take 1 capsule (50 mg total) by mouth 2 (two) times daily as needed for pain.   cetirizine (ZYRTEC) 10 MG tablet Take 10 mg by mouth daily.   clobetasol cream (TEMOVATE) 0.05 % Apply 1 Application topically 2 (two) times daily as needed (Dermatitis).   Cyanocobalamin (B-12 PO) Take 1 tablet by mouth daily.   lisinopril  (ZESTRIL ) 10 MG tablet Take 10 mg by mouth daily.   LORazepam  (ATIVAN ) 1 MG tablet Take 1 tablet (1 mg total) by mouth every 8 (eight) hours as needed for anxiety or sleep.   Multiple Vitamin (MULTIVITAMIN) capsule Take 1 capsule by mouth daily.   Nutritional Supplements (SALMON OIL PO) Take 1 Dose by mouth daily.   oxyCODONE  (OXY IR/ROXICODONE ) 5 MG immediate release tablet Take 1 tablet (5 mg total) by mouth every 4 (four) hours as needed for moderate pain (pain score 4-6).   pantoprazole  (PROTONIX ) 40 MG tablet Take 1 tablet (40 mg total) by mouth daily.   Probiotic Product (PROBIOTIC PO) Take 1 tablet by mouth daily.   sertraline  (ZOLOFT ) 100 MG tablet TAKE 1 TABLET BY MOUTH EVERY DAY   tamsulosin  (FLOMAX ) 0.4 MG CAPS capsule TAKE 1 CAPSULE BY MOUTH 2 TIMES DAILY.   traZODone  (DESYREL ) 100 MG tablet Take 1-2  tablets (100-200 mg total) by mouth at bedtime as needed. (Patient taking differently: Take 200 mg by mouth at bedtime.)   No facility-administered encounter medications on file as of 03/28/2024.    History: Past Medical History:  Diagnosis Date   Abnormal ECG 09/19/2017   NSR. Left axis deviation.    Alcohol abuse    Anxiety    Cutaneous T-cell lymphoma (HCC)    Depression    ED (erectile dysfunction)    GERD (gastroesophageal reflux disease)    History of kidney stones    Hypertension    Moderate tetrahydrocannabinol (THC) dependence (HCC)    Neuropathy    arms and legs   Photodermatitis    Sleep apnea    uses CPAP   Spinal stenosis    Urge incontinence    Past Surgical History:  Procedure Laterality Date   ANTERIOR LAT LUMBAR FUSION N/A 02/01/2024   Procedure: EXTTEME LATERAL INTERBODY FUSION OF LUMBAR THREE TO LUMBAR FOUR WITH POSTERIOR SUPPLEMENTAL FUSION INSTRUMENTED OF LUMBAR THREE TO LUMBAR FOUR AND LUMBAR FOUR AND LUMBAR FIVE WITH LUMBAR DECOMPRESSION AT LUMBAR TWO TO LUMBAR THREE;  Surgeon: Burnetta Aures, MD;  Location: [facility] OR;  Service: Orthopedics;  Laterality: N/A;  Extreme lateral interbody fusion  L3-4, posterior spinal fusion  L3-   CARPAL TUNNEL RELEASE Bilateral 1991   COLONOSCOPY  CYST REMOVAL TRUNK N/A 07/2014   back   ESOPHAGOGASTRODUODENOSCOPY     ROTATOR CUFF REPAIR Left 2003/2018   ROTATOR CUFF REPAIR Right 02/10/2014   Study: Echo  06/27/2016   LV size normal.  LV wall thickness normal.  LVEF 55-60%.  Diastolic filling pattern indicates impaired relaxation.  GLS test 17%.  LA is mildly dilated.  Moderate AV sclerosis without stenosis.  Trace MR.  RVSP 22.25 mmHg p   TRANSFORAMINAL LUMBAR INTERBODY FUSION (TLIF) WITH PEDICLE SCREW FIXATION 1 LEVEL N/A 11/22/2017   Procedure: TRANSFORAMINAL LUMBAR INTERBODY FUSION Lumbar four-five;  Surgeon: Burnetta Aures, MD;  Location: [facility] OR;  Service: Orthopedics;  Laterality: N/A;  4.5 hrs   ULNAR NERVE  TRANSPOSITION Left 2018   Family History  Problem Relation Age of Onset   Bipolar disorder Mother    Arthritis Mother    Depression Mother    Stroke Mother    Angina Father    Hypertension Father    Stroke Father    Lung cancer Brother    COPD Brother    Hearing loss Brother    Colon cancer Paternal Aunt    Bone cancer Paternal Uncle    Social History   Occupational History   Occupation: radio broadcast assistant  Tobacco Use   Smoking status: Former    Current packs/day: 0.00    Types: Cigarettes    Start date: 1971    Quit date: 2002    Years since quitting: 24.0   Smokeless tobacco: Never  Vaping Use   Vaping status: Never Used  Substance and Sexual Activity   Alcohol use: Not Currently    Alcohol/week: 7.0 - 14.0 standard drinks of alcohol    Types: 7 - 14 Standard drinks or equivalent per week    Comment: beer or glass of wine 1-2 drinks per day   Drug use: Yes    Frequency: 7.0 times per week    Types: Marijuana, Benzodiazepines    Comment: smokes marijuana daily- has prescription in [place] , pt has prescription for Ativan    Sexual activity: Yes    Partners: Female   Tobacco Counseling Counseling given: Not Answered  SDOH Screenings   Food Insecurity: No Food Insecurity (03/28/2024)  Housing: Low Risk (03/28/2024)  Transportation Needs: No Transportation Needs (03/28/2024)  Utilities: Not At Risk (03/28/2024)  Alcohol Screen: Low Risk (12/18/2023)  Depression (PHQ2-9): Low Risk (03/28/2024)  Financial Resource Strain: Low Risk (12/18/2023)   Received from [facility]  Physical Activity: Insufficiently Active (03/28/2024)  Social Connections: Socially Isolated (03/28/2024)  Stress: No Stress Concern Present (03/28/2024)  Tobacco Use: Medium Risk (03/28/2024)  Health Literacy: Adequate Health Literacy (03/28/2024)   See flowsheets for full screening details  Depression Screen PHQ 2 & 9 Depression Scale- Over the past 2 weeks, how often have you been bothered  by any of the following problems? Little interest or pleasure in doing things: 0 Feeling down, depressed, or hopeless (PHQ Adolescent also includes...irritable): 0 PHQ-2 Total Score: 0 Trouble falling or staying asleep, or sleeping too much: 0 Feeling tired or having little energy: 0 Poor appetite or overeating (PHQ Adolescent also includes...weight loss): 0 Feeling bad about yourself - or that you are a failure or have let yourself or your family down: 0 Trouble concentrating on things, such as reading the newspaper or watching television (PHQ Adolescent also includes...like school work): 0 Moving or speaking so slowly that other people could have noticed. Or the opposite - being so fidgety or restless that  you have been moving around a lot more than usual: 0 Thoughts that you would be better off dead, or of hurting yourself in some way: 0 PHQ-9 Total Score: 0 If you checked off any problems, how difficult have these problems made it for you to do your work, take care of things at home, or get along with other people?: Not difficult at all  Depression Treatment Depression Interventions/Treatment : EYV7-0 Score <4 Follow-up Not Indicated     Goals Addressed             This Visit's Progress    03/28/2024: To lose 10 pounds.               Objective:    Today's Vitals   03/28/24 1033  Weight: 195 lb (88.5 kg)  Height: 5' 8 (1.727 m)  PainSc: 0-No pain   Body mass index is 29.65 kg/m.  Hearing/Vision screen No results found. Immunizations and Health Maintenance Health Maintenance  Topic Date Due   Zoster Vaccines- Shingrix  (1 of 2) 05/08/1970   COVID-19 Vaccine (4 - 2025-26 season) 10/30/2023   Medicare Annual Wellness (AWV)  03/28/2025   DTaP/Tdap/Td (3 - Td or Tdap) 09/29/2025   Colonoscopy  03/17/2029   Pneumococcal Vaccine: 50+ Years  Completed   Influenza Vaccine  Completed   Meningococcal B Vaccine  Aged Out   Hepatitis C Screening  Discontinued         Assessment/Plan:  This is a routine wellness examination for Francisco Cowan.  Patient Care Team: Cleotilde Lukes, DO as PCP - General (Family Medicine) Burnetta Aures, MD as Consulting Physician (Orthopedic Surgery) Catherene Revering  I have personally reviewed and noted the following in the patients chart:   Medical and social history Use of alcohol, tobacco or illicit drugs  Current medications and supplements including opioid prescriptions. Functional ability and status Nutritional status Physical activity Advanced directives List of other physicians Hospitalizations, surgeries, and ER visits in previous 12 months Vitals Screenings to include cognitive, depression, and falls Referrals and appointments  No orders of the defined types were placed in this encounter.  In addition, I have reviewed and discussed with patient certain preventive protocols, quality metrics, and best practice recommendations. A written personalized care plan for preventive services as well as general preventive health recommendations were provided to patient.   Francisco LOISE Fuller, LPN   8/[id number]/7973   Return in 1 year (on 03/28/2025).  After Visit Summary: (MyChart) Due to this being a telephonic visit, the after visit summary with patients personalized plan was offered to patient via MyChart   Nurse Notes: Patient due for Shingrix  and Covid vaccines. "

## 2024-03-28 NOTE — Patient Instructions (Signed)
 Francisco Cowan,  Thank you for taking the time for your Medicare Wellness Visit. I appreciate your continued commitment to your health goals. Please review the care plan we discussed, and feel free to reach out if I can assist you further.  Please note that Annual Wellness Visits do not include a physical exam. Some assessments may be limited, especially if the visit was conducted virtually. If needed, we may recommend an in-person follow-up with your provider.  Ongoing Care Seeing your primary care provider every 3 to 6 months helps us  monitor your health and provide consistent, personalized care.   Referrals If a referral was made during today's visit and you haven't received any updates within two weeks, please contact the referred provider directly to check on the status.  Recommended Screenings:  Health Maintenance  Topic Date Due   Zoster (Shingles) Vaccine (1 of 2) 05/08/1970   COVID-19 Vaccine (4 - 2025-26 season) 10/30/2023   Medicare Annual Wellness Visit  03/26/2024   DTaP/Tdap/Td vaccine (3 - Td or Tdap) 09/29/2025   Colon Cancer Screening  03/17/2029   Pneumococcal Vaccine for age over 32  Completed   Flu Shot  Completed   Meningitis B Vaccine  Aged Out   Hepatitis C Screening  Discontinued       03/28/2024   10:35 AM  Advanced Directives  Does Patient Have a Medical Advance Directive? Yes  Type of Estate Agent of [place];Living will  Copy of Healthcare Power of Attorney in Chart? Yes - validated most recent copy scanned in chart (See row information)    Vision: Annual vision screenings are recommended for early detection of glaucoma, cataracts, and diabetic retinopathy. These exams can also reveal signs of chronic conditions such as diabetes and high blood pressure.  Dental: Annual dental screenings help detect early signs of oral cancer, gum disease, and other conditions linked to overall health, including heart disease and diabetes.  Please  see the attached documents for additional preventive care recommendations.

## 2024-03-30 ENCOUNTER — Other Ambulatory Visit: Payer: Self-pay | Admitting: Family Medicine

## 2024-04-04 ENCOUNTER — Other Ambulatory Visit (HOSPITAL_BASED_OUTPATIENT_CLINIC_OR_DEPARTMENT_OTHER)

## 2024-05-06 ENCOUNTER — Other Ambulatory Visit (HOSPITAL_BASED_OUTPATIENT_CLINIC_OR_DEPARTMENT_OTHER)

## 2024-05-21 ENCOUNTER — Ambulatory Visit: Admitting: Neurology
